# Patient Record
Sex: Female | Born: 1960 | Race: White | Hispanic: No | Marital: Married | Smoking: Never smoker
Health system: Southern US, Community
[De-identification: ages and names within clinical notes are randomized; demographics above are authoritative.]

## PROBLEM LIST (undated history)

## (undated) DIAGNOSIS — R7989 Other specified abnormal findings of blood chemistry: Secondary | ICD-10-CM

## (undated) DIAGNOSIS — C50919 Malignant neoplasm of unspecified site of unspecified female breast: Secondary | ICD-10-CM

## (undated) DIAGNOSIS — E7800 Pure hypercholesterolemia, unspecified: Secondary | ICD-10-CM

## (undated) DIAGNOSIS — M1990 Unspecified osteoarthritis, unspecified site: Secondary | ICD-10-CM

## (undated) DIAGNOSIS — G43909 Migraine, unspecified, not intractable, without status migrainosus: Secondary | ICD-10-CM

## (undated) DIAGNOSIS — R300 Dysuria: Secondary | ICD-10-CM

## (undated) DIAGNOSIS — J309 Allergic rhinitis, unspecified: Secondary | ICD-10-CM

## (undated) DIAGNOSIS — Z6831 Body mass index (BMI) 31.0-31.9, adult: Secondary | ICD-10-CM

## (undated) DIAGNOSIS — Z973 Presence of spectacles and contact lenses: Secondary | ICD-10-CM

## (undated) DIAGNOSIS — Z87442 Personal history of urinary calculi: Secondary | ICD-10-CM

## (undated) DIAGNOSIS — Z923 Personal history of irradiation: Secondary | ICD-10-CM

## (undated) DIAGNOSIS — G4733 Obstructive sleep apnea (adult) (pediatric): Secondary | ICD-10-CM

## (undated) DIAGNOSIS — F419 Anxiety disorder, unspecified: Secondary | ICD-10-CM

## (undated) DIAGNOSIS — F329 Major depressive disorder, single episode, unspecified: Secondary | ICD-10-CM

## (undated) DIAGNOSIS — E785 Hyperlipidemia, unspecified: Secondary | ICD-10-CM

## (undated) DIAGNOSIS — F32A Depression, unspecified: Secondary | ICD-10-CM

## (undated) DIAGNOSIS — R351 Nocturia: Secondary | ICD-10-CM

## (undated) DIAGNOSIS — E039 Hypothyroidism, unspecified: Secondary | ICD-10-CM

## (undated) HISTORY — DX: Pure hypercholesterolemia, unspecified: E78.00

## (undated) HISTORY — DX: Body mass index (BMI) 31.0-31.9, adult: Z68.31

## (undated) HISTORY — DX: Malignant neoplasm of unspecified site of unspecified female breast: C50.919

## (undated) HISTORY — PX: OTHER SURGICAL HISTORY: SHX169

## (undated) HISTORY — DX: Anxiety disorder, unspecified: F41.9

---

## 1987-05-22 HISTORY — PX: CHOLECYSTECTOMY: SHX55

## 1987-05-22 HISTORY — PX: CHOLECYSTECTOMY OPEN: SUR202

## 2001-09-21 ENCOUNTER — Other Ambulatory Visit: Admission: RE | Admit: 2001-09-21 | Discharge: 2001-09-21 | Payer: Self-pay | Admitting: Obstetrics and Gynecology

## 2013-01-11 ENCOUNTER — Other Ambulatory Visit: Payer: Self-pay | Admitting: Obstetrics and Gynecology

## 2013-01-11 ENCOUNTER — Other Ambulatory Visit (HOSPITAL_COMMUNITY)
Admission: RE | Admit: 2013-01-11 | Discharge: 2013-01-11 | Disposition: A | Discharge status: Home / Self Care | Payer: Self-pay | Source: Ambulatory Visit | Attending: Obstetrics and Gynecology | Admitting: Obstetrics and Gynecology

## 2013-01-11 DIAGNOSIS — Z113 Encounter for screening for infections with a predominantly sexual mode of transmission: Secondary | ICD-10-CM | POA: Insufficient documentation

## 2013-01-11 DIAGNOSIS — Z01419 Encounter for gynecological examination (general) (routine) without abnormal findings: Secondary | ICD-10-CM | POA: Insufficient documentation

## 2013-01-11 DIAGNOSIS — Z1151 Encounter for screening for human papillomavirus (HPV): Secondary | ICD-10-CM | POA: Insufficient documentation

## 2013-01-20 ENCOUNTER — Other Ambulatory Visit: Payer: Self-pay | Admitting: Obstetrics and Gynecology

## 2013-01-25 ENCOUNTER — Encounter (HOSPITAL_COMMUNITY): Payer: Self-pay | Admitting: Pharmacy Technician

## 2013-01-27 ENCOUNTER — Encounter (HOSPITAL_COMMUNITY)
Admission: RE | Admit: 2013-01-27 | Discharge: 2013-01-27 | Disposition: A | Discharge status: Home / Self Care | Payer: No Typology Code available for payment source | Source: Ambulatory Visit | Attending: Obstetrics and Gynecology | Admitting: Obstetrics and Gynecology

## 2013-01-27 ENCOUNTER — Other Ambulatory Visit: Payer: Self-pay | Admitting: Obstetrics and Gynecology

## 2013-01-27 ENCOUNTER — Encounter (HOSPITAL_COMMUNITY): Payer: Self-pay

## 2013-01-27 HISTORY — DX: Hypothyroidism, unspecified: E03.9

## 2013-01-27 HISTORY — DX: Depression, unspecified: F32.A

## 2013-01-27 HISTORY — DX: Major depressive disorder, single episode, unspecified: F32.9

## 2013-01-27 LAB — URINALYSIS, ROUTINE W REFLEX MICROSCOPIC
Glucose, UA: NEGATIVE mg/dL
Nitrite: POSITIVE — AB
Protein, ur: 100 mg/dL — AB
pH: 7 (ref 5.0–8.0)

## 2013-01-27 LAB — BASIC METABOLIC PANEL
BUN: 15 mg/dL (ref 6–23)
Chloride: 102 mEq/L (ref 96–112)
Glucose, Bld: 103 mg/dL — ABNORMAL HIGH (ref 70–99)
Potassium: 3.7 mEq/L (ref 3.5–5.1)

## 2013-01-27 LAB — SURGICAL PCR SCREEN
MRSA, PCR: NEGATIVE
Staphylococcus aureus: NEGATIVE

## 2013-01-27 LAB — CBC
HCT: 37.8 % (ref 36.0–46.0)
Hemoglobin: 12.6 g/dL (ref 12.0–15.0)
MCH: 27.9 pg (ref 26.0–34.0)
MCHC: 33.3 g/dL (ref 30.0–36.0)
RBC: 4.52 MIL/uL (ref 3.87–5.11)

## 2013-01-27 LAB — URINE MICROSCOPIC-ADD ON

## 2013-01-27 NOTE — Patient Instructions (Signed)
Elizabeth Bartlett  01/27/2013   Your procedure is scheduled on:  Tuesday, 01/31/13  Report to [facility] [place] [facility].  Call this number if you have problems the morning of surgery: [phone]   Remember:   Do not eat food or drink liquids after midnight.   Take these medicines the morning of surgery with A SIP OF WATER: prozac, levothyroxine, flonase   Do not wear jewelry, make-up or nail polish.  Do not wear lotions, powders, or perfumes. You may wear deodorant.  Do not shave 48 hours prior to surgery. Men may shave face and neck.  Do not bring valuables to the hospital.  Contacts, dentures or bridgework may not be worn into surgery.  Leave suitcase in the car. After surgery it may be brought to your room.  For patients admitted to the hospital, checkout time is 11:00 AM the day of  discharge.   Patients discharged the day of surgery will not be allowed to drive  home.  Name and phone number of your driver: family  Special Instructions: Shower using CHG 2 nights before surgery and the night before surgery.  If you shower the day of surgery use CHG.  Use special wash - you have one bottle of CHG for all showers.  You should use approximately 1/3 of the bottle for each shower.   Please read over the following fact sheets that you were given: Pain Booklet, MRSA Information, Surgical Site Infection Prevention, Anesthesia Post-op Instructions and Care and Recovery After Surgery   Supracervical Hysterectomy A supracervical hysterectomy is minimally invasive surgery to remove the top part of the uterus, but not the cervix. This surgery can be performed by making a large cut (incision) in the abdomen. It can also be done with a thin, lighted tube (laparoscope) inserted into 2 small incisions in the lower abdomen. Your fallopian tubes and ovaries can be removed (bilateral salpingo-oopherectomy) during this surgery as well. If a supracervical hysterectomy is started and it is not safe to continue, the  laparoscopic surgery will be converted to an open abdominal surgery. You will not have menstrual periods or be able to get pregnant after having this surgery. If a bilateral salpingo-oopherectomy was performed before menopause, you will go through a sudden (abrupt) menopause. This can be helped with hormone medicines. Benefits of minimally invasive surgery include:  Less pain.  Less risk of blood loss.  Less risk of infection.  Quicker return to normal activities.  Usually a 1 night stay in the hospital.  Overall patient satisfaction. LET YOUR CAREGIVER KNOW ABOUT:  Any history of abnormal Pap tests.  Allergies to food or medicine.  Medicines taken, including vitamins, herbs, eyedrops, over-the-counter medicines, and creams.  Use of steroids (by mouth or creams).  Previous problems with anesthetics or numbing medicines.  History of bleeding problems or blood clots.  Previous surgery.  Other health problems, including diabetes and kidney problems.  Any infections or colds you may have developed.  Symptoms of irregular or heavy periods, weight loss, or urinary or bowel changes. RISKS AND COMPLICATIONS   Bleeding.  Blood clots in the legs or lung.  Infection.  Injury to surrounding organs.  Problems with anesthesia.  Risk of conversion to an open abdominal incision.  Early menopause symptoms (hot flashes, night sweats, insomnia).  Additional surgery later to remove the cervix if you have problems with the cervix. BEFORE THE PROCEDURE  Ask your caregiver about changing or stopping your regular medicines.  Do not take  aspirin or blood thinners (anticoagulants) for 1 week before the surgery, or as told by your caregiver.  Do not eat or drink anything for 8 hours before the surgery, or as told by your caregiver.  Quit smoking if you smoke.  Arrange for a ride home after surgery and for someone to help you at home during recovery. PROCEDURE   You will be  given an antibiotic medicine.  An intravenous (IV) line will be placed in your arm. You will be given medicine to make you sleep (general anesthetic).  A gas (carbon dioxide) will be used to inflate your abdomen. This will allow your surgeon to look inside your abdomen, perform your surgery, and treat any other problems found if necessary.  Three or four small incisions (often less than  inch) will be made in your abdomen. One of these incisions will be made in the area of your belly button (navel). The laparoscope will be inserted into the incision. Your surgeon will look through the laparoscope while doing your procedure.  Other surgical instruments will be inserted through the other incisions.  The uterus will be cut into small pieces and removed through the small incisions.  Your incisions will be closed. AFTER THE PROCEDURE   The gas will be released from inside your abdomen.  You will be taken to the recovery area where a nurse will watch and check your progress. Once you are awake, stable, and taking fluids well, without other problems, you will return to your room or be allowed to go home.  There is usually minimal discomfort following the surgery because the incisions are so small.  You will be given pain medicine while you are in the hospital and for when you go home.  Try to have someone with you for the first 3 to 5 days after you go home.  Follow up with your surgeon in 2 to 4 weeks after surgery to evaluate your progress. Document Released: 05/25/2008 Document Revised: 02/29/2012 Document Reviewed: 07/24/2011 [facility] Patient Information 2013 [place], [place].

## 2013-01-28 LAB — TYPE AND SCREEN: ABO/RH(D): A POS

## 2013-01-29 LAB — URINE CULTURE
Colony Count: NO GROWTH
Culture: NO GROWTH

## 2013-01-30 NOTE — H&P (Signed)
Elizabeth Bartlett is an 52 y.o. female. She is admitted for laparoscopic supracervical hysterectomy for resolution of heavy periods after evaluation in December January this months. Menses are described as heavy for 2 and 3 weeks, descensus in severity without resulting anemia ends were normal until her 78s  Pertinent Gynecological History: Menses: usually lasting 6-10 days to . days Bleeding:  Contraception: coitus interruptus DES exposure: unknown Blood transfusions: none Sexually transmitted diseases: no past history Previous GYN Procedures: cryo  Last mammogram: normal Date:  Last pap:  Date:  OB History: G, P    Menstrual History: Menarche age:  No LMP recorded.    Past Medical History  Diagnosis Date  . Hypothyroidism   . Depression     Past Surgical History  Procedure Laterality Date  . Cesarean section  02/1987  . Cholecystectomy  05/1987    open    No family history on file.  Social History:  reports that she has never smoked. She does not have any smokeless tobacco history on file. She reports that she does not drink alcohol or use illicit drugs.  Allergies: No Known Allergies  No prescriptions prior to admission   Weight 152 pounds pulse 70s urinalysis negative LMP now Review of Systems  Constitutional: Negative.  Negative for fever and chills.  HENT: Negative.   Respiratory: Negative.  Negative for cough, sputum production and shortness of breath.   Cardiovascular: Negative.   Genitourinary:       Notable for GYN history for cycles lasting 7-10 days per month in addition a heaviness and clots. She had normal periods until the late 40s    There were no vitals taken for this visit. Physical Exam  Vitals reviewed. Constitutional: She is oriented to person, place, and time. She appears well-developed and well-nourished.  HENT:  Head: Normocephalic and atraumatic.  Eyes: Pupils are equal, round, and reactive to light.  Neck: Normal range of motion.   Cardiovascular: Normal rate and regular rhythm.   Respiratory: Effort normal and breath sounds normal.  GI: Soft. Bowel sounds are normal.  Genitourinary: Vagina normal and uterus normal.  Today's ultrasound during preop shows a thickened endometrium the 9 mm echogenic focus consistent with polyp or myoma. The adnexa are normal uterus is essentially normal except for small 13 mm fibroid and in the anterior uterine wall. She has a prior cesarean section and visible old C-section scar per ultrasound  Musculoskeletal: Normal range of motion.  Neurological: She is alert and oriented to person, place, and time.    No results found for this or any previous visit (from the past 24 hour(s)).  No results found.  Assessment/Plan: None Glean Hess with benign endometrium on tissue sample, declining consideration of endometrial ablation Treatment and alternatives reviewed with patient with laparoscopic supracervical hysterectomy selected patient declines consideration of endometrial ablation and lesser procedures  Thadd Apuzzo V 01/30/2013, 8:47 PM

## 2013-01-31 ENCOUNTER — Ambulatory Visit (HOSPITAL_COMMUNITY): Payer: No Typology Code available for payment source | Admitting: Anesthesiology

## 2013-01-31 ENCOUNTER — Encounter (HOSPITAL_COMMUNITY)
Admission: RE | Disposition: A | Discharge status: Home / Self Care | Payer: Self-pay | Source: Ambulatory Visit | Attending: Obstetrics and Gynecology

## 2013-01-31 ENCOUNTER — Encounter (HOSPITAL_COMMUNITY): Payer: Self-pay | Admitting: *Deleted

## 2013-01-31 ENCOUNTER — Observation Stay (HOSPITAL_COMMUNITY)
Admission: RE | Admit: 2013-01-31 | Discharge: 2013-02-01 | Disposition: A | Discharge status: Home / Self Care | Payer: No Typology Code available for payment source | Source: Ambulatory Visit | Attending: Obstetrics and Gynecology | Admitting: Obstetrics and Gynecology

## 2013-01-31 ENCOUNTER — Encounter (HOSPITAL_COMMUNITY): Payer: Self-pay | Admitting: Anesthesiology

## 2013-01-31 DIAGNOSIS — E039 Hypothyroidism, unspecified: Secondary | ICD-10-CM | POA: Insufficient documentation

## 2013-01-31 DIAGNOSIS — N920 Excessive and frequent menstruation with regular cycle: Principal | ICD-10-CM | POA: Insufficient documentation

## 2013-01-31 DIAGNOSIS — N924 Excessive bleeding in the premenopausal period: Secondary | ICD-10-CM

## 2013-01-31 DIAGNOSIS — D259 Leiomyoma of uterus, unspecified: Secondary | ICD-10-CM | POA: Insufficient documentation

## 2013-01-31 DIAGNOSIS — Z01812 Encounter for preprocedural laboratory examination: Secondary | ICD-10-CM | POA: Insufficient documentation

## 2013-01-31 HISTORY — PX: LAPAROSCOPIC SUPRACERVICAL HYSTERECTOMY: SHX5399

## 2013-01-31 HISTORY — PX: BILATERAL SALPINGECTOMY: SHX5743

## 2013-01-31 SURGERY — HYSTERECTOMY, SUPRACERVICAL, LAPAROSCOPIC
Anesthesia: General | Site: Abdomen | Wound class: Clean

## 2013-01-31 MED ORDER — NEOSTIGMINE METHYLSULFATE 1 MG/ML IJ SOLN
INTRAMUSCULAR | Status: AC
  Filled 2013-01-31: qty 1

## 2013-01-31 MED ORDER — KCL IN DEXTROSE-NACL 20-5-0.45 MEQ/L-%-% IV SOLN
INTRAVENOUS | Status: DC
  Administered 2013-01-31: via INTRAVENOUS

## 2013-01-31 MED ORDER — LACTATED RINGERS IV SOLN: INTRAVENOUS | Status: DC

## 2013-01-31 MED ORDER — SODIUM CHLORIDE 0.9 % IR SOLN
Status: DC | PRN
  Administered 2013-01-31: 3000 mL

## 2013-01-31 MED ORDER — LIDOCAINE HCL (PF) 1 % IJ SOLN
INTRAMUSCULAR | Status: AC
  Filled 2013-01-31: qty 5

## 2013-01-31 MED ORDER — MIDAZOLAM HCL 5 MG/5ML IJ SOLN
INTRAMUSCULAR | Status: DC | PRN
  Administered 2013-01-31: 2 mg via INTRAVENOUS

## 2013-01-31 MED ORDER — FENTANYL CITRATE 0.05 MG/ML IJ SOLN
INTRAMUSCULAR | Status: AC
  Filled 2013-01-31: qty 2

## 2013-01-31 MED ORDER — PROPOFOL 10 MG/ML IV EMUL
INTRAVENOUS | Status: DC | PRN
  Administered 2013-01-31: 120 mg via INTRAVENOUS

## 2013-01-31 MED ORDER — SUFENTANIL CITRATE 50 MCG/ML IV SOLN
INTRAVENOUS | Status: DC | PRN
  Administered 2013-01-31 (×2): 10 ug via INTRAVENOUS
  Administered 2013-01-31: 30 ug via INTRAVENOUS

## 2013-01-31 MED ORDER — LIDOCAINE HCL (CARDIAC) 10 MG/ML IV SOLN
INTRAVENOUS | Status: DC | PRN
  Administered 2013-01-31: 50 mg via INTRAVENOUS

## 2013-01-31 MED ORDER — ONDANSETRON HCL 4 MG/2ML IJ SOLN
INTRAMUSCULAR | Status: AC
  Filled 2013-01-31: qty 2

## 2013-01-31 MED ORDER — ROCURONIUM BROMIDE 100 MG/10ML IV SOLN
INTRAVENOUS | Status: DC | PRN
  Administered 2013-01-31: 10 mg via INTRAVENOUS
  Administered 2013-01-31: 40 mg via INTRAVENOUS

## 2013-01-31 MED ORDER — ONDANSETRON HCL 4 MG/2ML IJ SOLN
4.0000 mg | Freq: Once | INTRAMUSCULAR | Status: AC | PRN
  Administered 2013-01-31: 4 mg via INTRAVENOUS

## 2013-01-31 MED ORDER — GLYCOPYRROLATE 0.2 MG/ML IJ SOLN
INTRAMUSCULAR | Status: DC | PRN
  Administered 2013-01-31: 0.6 mg via INTRAVENOUS

## 2013-01-31 MED ORDER — CEFAZOLIN SODIUM-DEXTROSE 2-3 GM-% IV SOLR
INTRAVENOUS | Status: AC
  Filled 2013-01-31: qty 50

## 2013-01-31 MED ORDER — KETOROLAC TROMETHAMINE 30 MG/ML IJ SOLN
INTRAMUSCULAR | Status: AC
  Filled 2013-01-31: qty 1

## 2013-01-31 MED ORDER — KETOROLAC TROMETHAMINE 30 MG/ML IJ SOLN: 30.0000 mg | Freq: Three times a day (TID) | INTRAMUSCULAR | Status: AC

## 2013-01-31 MED ORDER — LACTATED RINGERS IV SOLN
INTRAVENOUS | Status: DC | PRN
  Administered 2013-01-31 (×3): via INTRAVENOUS

## 2013-01-31 MED ORDER — BUPIVACAINE HCL (PF) 0.5 % IJ SOLN
INTRAMUSCULAR | Status: DC | PRN
  Administered 2013-01-31: 10 mL

## 2013-01-31 MED ORDER — FLUOXETINE HCL 20 MG PO CAPS
40.0000 mg | ORAL_CAPSULE | Freq: Every day | ORAL | Status: DC
  Administered 2013-02-01: 40 mg via ORAL
  Filled 2013-01-31: qty 2

## 2013-01-31 MED ORDER — GLYCOPYRROLATE 0.2 MG/ML IJ SOLN
0.2000 mg | Freq: Once | INTRAMUSCULAR | Status: AC
  Administered 2013-01-31: 0.2 mg via INTRAVENOUS

## 2013-01-31 MED ORDER — LEVOTHYROXINE SODIUM 25 MCG PO TABS
25.0000 ug | ORAL_TABLET | Freq: Every day | ORAL | Status: DC
  Administered 2013-02-01: 25 ug via ORAL
  Filled 2013-01-31: qty 1

## 2013-01-31 MED ORDER — ONDANSETRON HCL 4 MG PO TABS: 4.0000 mg | ORAL_TABLET | Freq: Four times a day (QID) | ORAL | Status: DC | PRN

## 2013-01-31 MED ORDER — FENTANYL CITRATE 0.05 MG/ML IJ SOLN
25.0000 ug | INTRAMUSCULAR | Status: DC | PRN
  Administered 2013-01-31 (×2): 50 ug via INTRAVENOUS

## 2013-01-31 MED ORDER — HYDROMORPHONE HCL PF 1 MG/ML IJ SOLN: 0.2000 mg | INTRAMUSCULAR | Status: DC | PRN

## 2013-01-31 MED ORDER — MIDAZOLAM HCL 2 MG/2ML IJ SOLN
INTRAMUSCULAR | Status: AC
  Filled 2013-01-31: qty 2

## 2013-01-31 MED ORDER — ROCURONIUM BROMIDE 50 MG/5ML IV SOLN
INTRAVENOUS | Status: AC
  Filled 2013-01-31: qty 1

## 2013-01-31 MED ORDER — GLYCOPYRROLATE 0.2 MG/ML IJ SOLN
INTRAMUSCULAR | Status: AC
  Filled 2013-01-31: qty 1

## 2013-01-31 MED ORDER — GLYCOPYRROLATE 0.2 MG/ML IJ SOLN
INTRAMUSCULAR | Status: AC
  Filled 2013-01-31: qty 3

## 2013-01-31 MED ORDER — NEOSTIGMINE METHYLSULFATE 1 MG/ML IJ SOLN
INTRAMUSCULAR | Status: DC | PRN
  Administered 2013-01-31: 4 mg via INTRAVENOUS

## 2013-01-31 MED ORDER — ONDANSETRON HCL 4 MG/2ML IJ SOLN
4.0000 mg | Freq: Once | INTRAMUSCULAR | Status: AC
  Administered 2013-01-31: 4 mg via INTRAVENOUS

## 2013-01-31 MED ORDER — KETOROLAC TROMETHAMINE 30 MG/ML IJ SOLN
30.0000 mg | Freq: Three times a day (TID) | INTRAMUSCULAR | Status: AC
  Administered 2013-01-31 – 2013-02-01 (×3): 30 mg via INTRAVENOUS
  Filled 2013-01-31 (×3): qty 1

## 2013-01-31 MED ORDER — PANTOPRAZOLE SODIUM 40 MG PO TBEC
40.0000 mg | DELAYED_RELEASE_TABLET | Freq: Every day | ORAL | Status: DC
  Administered 2013-02-01 (×2): 40 mg via ORAL
  Filled 2013-01-31: qty 1

## 2013-01-31 MED ORDER — BUPIVACAINE HCL (PF) 0.5 % IJ SOLN
INTRAMUSCULAR | Status: AC
  Filled 2013-01-31: qty 30

## 2013-01-31 MED ORDER — CEFAZOLIN SODIUM-DEXTROSE 2-3 GM-% IV SOLR: 2.0000 g | INTRAVENOUS | Status: DC

## 2013-01-31 MED ORDER — ONDANSETRON HCL 4 MG/2ML IJ SOLN
4.0000 mg | Freq: Four times a day (QID) | INTRAMUSCULAR | Status: DC | PRN
  Filled 2013-01-31: qty 2

## 2013-01-31 MED ORDER — PROPOFOL 10 MG/ML IV EMUL
INTRAVENOUS | Status: AC
  Filled 2013-01-31: qty 20

## 2013-01-31 MED ORDER — CEFAZOLIN SODIUM-DEXTROSE 2-3 GM-% IV SOLR
INTRAVENOUS | Status: DC | PRN
  Administered 2013-01-31: 2 g via INTRAVENOUS

## 2013-01-31 MED ORDER — SUCCINYLCHOLINE CHLORIDE 20 MG/ML IJ SOLN
INTRAMUSCULAR | Status: AC
  Filled 2013-01-31: qty 1

## 2013-01-31 MED ORDER — MIDAZOLAM HCL 2 MG/2ML IJ SOLN
1.0000 mg | INTRAMUSCULAR | Status: DC | PRN
  Administered 2013-01-31: 2 mg via INTRAVENOUS

## 2013-01-31 MED ORDER — ARTIFICIAL TEARS OP OINT
TOPICAL_OINTMENT | OPHTHALMIC | Status: AC
  Filled 2013-01-31: qty 3.5

## 2013-01-31 MED ORDER — SUFENTANIL CITRATE 50 MCG/ML IV SOLN
INTRAVENOUS | Status: AC
  Filled 2013-01-31: qty 1

## 2013-01-31 MED ORDER — OXYCODONE-ACETAMINOPHEN 5-325 MG PO TABS
1.0000 | ORAL_TABLET | ORAL | Status: DC | PRN
  Administered 2013-01-31: 1 via ORAL
  Filled 2013-01-31: qty 1

## 2013-01-31 MED ORDER — KETOROLAC TROMETHAMINE 30 MG/ML IJ SOLN
30.0000 mg | Freq: Once | INTRAMUSCULAR | Status: AC
  Administered 2013-01-31: 30 mg via INTRAVENOUS

## 2013-01-31 MED ORDER — 0.9 % SODIUM CHLORIDE (POUR BTL) OPTIME
TOPICAL | Status: DC | PRN
  Administered 2013-01-31: 1000 mL

## 2013-01-31 SURGICAL SUPPLY — 56 items
ADH SKN CLS APL DERMABOND .7 (GAUZE/BANDAGES/DRESSINGS)
APPLIER CLIP UNV 5X34 EPIX (ENDOMECHANICALS) ×3 IMPLANT
APR XCLPCLP 20M/L UNV 34X5 (ENDOMECHANICALS) ×2
BAG HAMPER (MISCELLANEOUS) ×3 IMPLANT
BLADE LAPAROSCOPIC MORCELL KIT (BLADE) ×3 IMPLANT
BLADE SURG SZ11 CARB STEEL (BLADE) ×3 IMPLANT
CLOTH BEACON ORANGE TIMEOUT ST (SAFETY) ×3 IMPLANT
COVER LIGHT HANDLE STERIS (MISCELLANEOUS) ×6 IMPLANT
DERMABOND ADVANCED (GAUZE/BANDAGES/DRESSINGS)
DERMABOND ADVANCED .7 DNX12 (GAUZE/BANDAGES/DRESSINGS) IMPLANT
DISSECTOR BLUNT TIP ENDO 5MM (MISCELLANEOUS) IMPLANT
DRAPE PROXIMA HALF (DRAPES) ×3 IMPLANT
DRESSING COVERLET 3X1 FLEXIBLE (GAUZE/BANDAGES/DRESSINGS) ×12 IMPLANT
ELECT REM PT RETURN 9FT ADLT (ELECTROSURGICAL) ×3
ELECTRODE REM PT RTRN 9FT ADLT (ELECTROSURGICAL) ×2 IMPLANT
FILTER SMOKE EVAC LAPAROSHD (FILTER) ×3 IMPLANT
FORMALIN 10 PREFIL 480ML (MISCELLANEOUS) ×3 IMPLANT
GAUZE SPONGE 4X4 16PLY XRAY LF (GAUZE/BANDAGES/DRESSINGS) ×3 IMPLANT
GLOVE ECLIPSE 9.0 STRL (GLOVE) ×3 IMPLANT
GLOVE INDICATOR STER SZ 9 (GLOVE) ×3 IMPLANT
GOWN STRL REIN 3XL LVL4 (GOWN DISPOSABLE) ×3 IMPLANT
GOWN STRL REIN XL XLG (GOWN DISPOSABLE) ×6 IMPLANT
INST SET LAPROSCOPIC GYN AP (KITS) ×3 IMPLANT
IV NS IRRIG 3000ML ARTHROMATIC (IV SOLUTION) ×3 IMPLANT
KIT ROOM TURNOVER AP CYSTO (KITS) ×3 IMPLANT
MANIFOLD NEPTUNE II (INSTRUMENTS) ×3 IMPLANT
NDL HYPO 25X1 1.5 SAFETY (NEEDLE) IMPLANT
NDL INSUFFLATION 14GA 120MM (NEEDLE) ×2 IMPLANT
NEEDLE HYPO 25X1 1.5 SAFETY (NEEDLE) ×3 IMPLANT
NEEDLE INSUFFLATION 14GA 120MM (NEEDLE) ×3 IMPLANT
NS IRRIG 1000ML POUR BTL (IV SOLUTION) ×3 IMPLANT
PACK PERI GYN (CUSTOM PROCEDURE TRAY) ×3 IMPLANT
PAD ARMBOARD 7.5X6 YLW CONV (MISCELLANEOUS) ×3 IMPLANT
SCALPEL HARMONIC ACE (MISCELLANEOUS) ×3 IMPLANT
SET BASIN LINEN APH (SET/KITS/TRAYS/PACK) ×3 IMPLANT
SET TUBE IRRIG SUCTION NO TIP (IRRIGATION / IRRIGATOR) ×4 IMPLANT
SOL PREP PROV IODINE SCRUB 4OZ (MISCELLANEOUS) ×3 IMPLANT
SOLUTION ANTI FOG 6CC (MISCELLANEOUS) ×3 IMPLANT
SPONGE GAUZE 4X4 12PLY (GAUZE/BANDAGES/DRESSINGS) ×1 IMPLANT
STRIP CLOSURE SKIN 1/4X3 (GAUZE/BANDAGES/DRESSINGS) ×3 IMPLANT
SUT CHROMIC 0 CT 1 (SUTURE) ×2 IMPLANT
SUT CHROMIC 2 0 CT 1 (SUTURE) ×2 IMPLANT
SUT VIC AB 2-0 CT2 27 (SUTURE) ×2 IMPLANT
SUT VIC AB 4-0 PS2 27 (SUTURE) ×3 IMPLANT
SUT VICRYL 0 UR6 27IN ABS (SUTURE) ×3 IMPLANT
SYR CONTROL 10ML LL (SYRINGE) ×1 IMPLANT
SYRINGE 10CC LL (SYRINGE) ×3 IMPLANT
TAPE CLOTH SURG 4X10 WHT LF (GAUZE/BANDAGES/DRESSINGS) ×1 IMPLANT
TRAY FOLEY CATH 14FR (SET/KITS/TRAYS/PACK) ×3 IMPLANT
TROCAR Z-THAD FIOS HNDL 12X100 (TROCAR) ×3 IMPLANT
TROCAR Z-THRD FIOS HNDL 11X100 (TROCAR) ×3 IMPLANT
TROCAR Z-THREAD FIOS 5X100MM (TROCAR) IMPLANT
TROCAR Z-THREAD OPTICAL 5X100M (TROCAR) ×3 IMPLANT
TROCAR Z-THREAD SLEEVE 11X100 (TROCAR) ×3 IMPLANT
TUBING HI FLO HEAT INSUFFLATOR (IRRIGATION / IRRIGATOR) ×2 IMPLANT
WARMER LAPAROSCOPE (MISCELLANEOUS) ×3 IMPLANT

## 2013-01-31 NOTE — Anesthesia Preprocedure Evaluation (Signed)
Anesthesia Evaluation  Patient identified by MRN, date of birth, ID band Patient awake    Reviewed: Allergy & Precautions, H&P , NPO status , Patient's Chart, lab work & pertinent test results  History of Anesthesia Complications Negative for: history of anesthetic complications  Airway Mallampati: I TM Distance: >3 FB     Dental  (+) Teeth Intact   Pulmonary neg pulmonary ROS,  breath sounds clear to auscultation        Cardiovascular negative cardio ROS  Rhythm:Regular Rate:Normal     Neuro/Psych PSYCHIATRIC DISORDERS Depression    GI/Hepatic negative GI ROS,   Endo/Other  Hypothyroidism   Renal/GU      Musculoskeletal   Abdominal   Peds  Hematology   Anesthesia Other Findings   Reproductive/Obstetrics                           Anesthesia Physical Anesthesia Plan  ASA: II  Anesthesia Plan: General   Post-op Pain Management:    Induction: Intravenous  Airway Management Planned: Oral ETT  Additional Equipment:   Intra-op Plan:   Post-operative Plan: Extubation in OR  Informed Consent: I have reviewed the patients History and Physical, chart, labs and discussed the procedure including the risks, benefits and alternatives for the proposed anesthesia with the patient or authorized representative who has indicated his/her understanding and acceptance.     Plan Discussed with:   Anesthesia Plan Comments:         Anesthesia Quick Evaluation

## 2013-01-31 NOTE — Progress Notes (Signed)
Cicily notified of 15 minutes call. Voiced understanding.

## 2013-01-31 NOTE — Brief Op Note (Signed)
01/31/2013  10:01 AM  PATIENT:  Elizabeth Bartlett  52 y.o. female  PRE-OPERATIVE DIAGNOSIS:  menometorrhagia uterine fibroids anteversion of uterus POST-OPERATIVE DIAGNOSIS:  menometorrhagia uterine fibroids  PROCEDURE:  Procedure(s): LAPAROSCOPIC SUPRACERVICAL HYSTERECTOMY (N/A) BILATERAL SALPINGECTOMY (Bilateral)  SURGEON:  Surgeon(s) and Role:    * Tilda Burrow, MD - Primary PHYSICIAN ASSISTANT:  ASSISTANTS: [place] RN-FA   ANESTHESIA:   general EBL:  Total I/O In: 2600 [I.V.:2600] Out: 125 [Urine:100; Blood:25]  BLOOD ADMINISTERED:none DRAINS: none   LOCAL MEDICATIONS USED:  MARCAINE    and Amount: 20 ml SPECIMEN:  Source of Specimen:  uterus, bilateral tubes  DISPOSITION OF SPECIMEN:  PATHOLOGY COUNTS:  YES TOURNIQUET:  * No tourniquets in log *  DICTATION: .Dragon Dictation Patient was taken to the operating room prepped and draped for combined abdominal and vaginal procedure with Hulka uterine manipulator attached the cervix and Foley catheter inserted after timeout was confirmed by surgical team, and Ancef 2 g administered intravenously. The patient was in low lithotomy leg support position. An infraumbical incision 1 cm was performed as well as a right lower quadrant suprapubic and left lower quadrant incisions of similar length. Left lower quadrant with smaller incision. Veress needle was used for water droplet technique 2 can firm intraperitoneal location and pneumoperitoneum achieved, then direct insertion of laparoscopic trocar placed, a blade less trocar. Right lower quadrant and left lower quadrant trochars were inserted under direct visualization and inspection the pelvis performed. There was evidence of adhesions from the left Suprapubic right lower  sigmoid epiploic fat to the left pelvic sidewall, to the backside of the left round ligament, and there was bladder flap adhesions from the old cesarean section. Attention was first directed to the right round ligament  which was taken down, and the right tube amputated off of the broad ligament using harmonic scalpel. The you aspects of the right broad ligament were taken down and bladder flap developed anteriorly in the area of the old adhesions which were taken down. No time was the bladder considered involved in the adhesio lysis. Uterine vessels on the right side were skeletonized. On the left side the tip of the tube could not be visualized as it was adherent underneath the sigmoid colon adhesions so the distal two thirds of the fallopian tube were amputated and removed along with transection of the left round ligament. The adhesions on the left side were more dense. There was some small amount of bleeding as we continued to expose and isolate the left uterine vessels. Hemoclips were placed across the proximal uterine vessels, and several were required due to the tendency to ooze. Unipolar cautery was resolved the bleeding. Attention was then returned to the right side where the hemoclips were similarly applied, the uterine vessels transected, and then the cervical stump amputated at the lower uterine segment, just above the posterior insertion of the uterosacral ligaments, with good hemostasis. Specimen was amputated off the cuff, then pedicle inspected for hemostasis. The only significant bleeding had been from the area in front of the left uterine vessels. Estimated blood loss 25 cc at this time. The uterine morcellator was placed through the right lower quadrant trocar insertion site, and morcellation of the uterus performed without difficulty. All tissue fragments as visible sized tissue samples were removed Allis was irrigated briefly. Pollock cautery to the endocervical canal was then performed pelvis was irrigated with saline left in place. Instruments were removed pelvis irrigated, deflated and the fascial closure the right lower quadrant  suprapubic and umbilical sites performed with 0 Vicryl being careful to use S.  retractors to elevate the fascia out of the area of the intra-abdominal contents.. Incisions were irrigated dried subcuticularly closed with 4-0 Vicryl. Patient tolerated procedure well had Marcaine injected around the fascial closures and will be allowed to go home from day surgery unless the pain management issues  needs overnight observation. Sponge and needle counts correct  PLAN OF CARE: Discharge to home after PACU  PATIENT DISPOSITION:  PACU - hemodynamically stable.   Delay start of Pharmacological VTE agent (>24hrs) due to surgical blood loss or risk of bleeding: not applicable

## 2013-01-31 NOTE — Interval H&P Note (Signed)
History and Physical Interval Note:  01/31/2013 7:25 AM  Elizabeth Bartlett  has presented today for surgery, with the diagnosis of menometorrhagia uterine fibroids anteversion of uterus  The various methods of treatment have been discussed with the patient and family. After consideration of risks, benefits and other options for treatment, the patient has consented to  Procedure(s): LAPAROSCOPIC SUPRACERVICAL HYSTERECTOMY (N/A) as a surgical intervention .  The patient's history has been reviewed, patient examined, no change in status, stable for surgery.  I have reviewed the patient's chart and labs.  Questions were answered to the patient's satisfaction.     Tilda Burrow

## 2013-01-31 NOTE — Anesthesia Postprocedure Evaluation (Signed)
  Anesthesia Post-op Note  Patient: Elizabeth Bartlett  Procedure(s) Performed: Procedure(s): LAPAROSCOPIC SUPRACERVICAL HYSTERECTOMY (N/A) BILATERAL SALPINGECTOMY (Bilateral)  Patient Location: PACU  Anesthesia Type: General  Level of Consciousness: awake, alert , oriented and patient cooperative  Airway and Oxygen Therapy: Patient Spontanous Breathing and Patient connected to face mask oxygen  Post-op Pain: mild  Post-op Assessment: Post-op Vital signs reviewed, Patient's Cardiovascular Status Stable, Respiratory Function Stable, Patent Airway and No signs of Nausea or vomiting  Post-op Vital Signs: Reviewed and stable  Complications: No apparent anesthesia complications

## 2013-01-31 NOTE — Anesthesia Procedure Notes (Signed)
Procedure Name: Intubation Date/Time: 01/31/2013 7:41 AM Performed by: Carolyne Littles, Maor Meckel L Pre-anesthesia Checklist: Patient identified, Patient being monitored, Timeout performed, Emergency Drugs available and Suction available Patient Re-evaluated:Patient Re-evaluated prior to inductionOxygen Delivery Method: Circle System Utilized Preoxygenation: Pre-oxygenation with 100% oxygen Intubation Type: IV induction Ventilation: Mask ventilation without difficulty Laryngoscope Size: 3 and Miller Grade View: Grade I Tube type: Oral Tube size: 7.0 mm Number of attempts: 1 Airway Equipment and Method: stylet Placement Confirmation: ETT inserted through vocal cords under direct vision,  positive ETCO2 and breath sounds checked- equal and bilateral Secured at: 21 cm Tube secured with: Tape Dental Injury: Teeth and Oropharynx as per pre-operative assessment

## 2013-01-31 NOTE — Progress Notes (Signed)
UR Chart Review Completed  

## 2013-01-31 NOTE — Transfer of Care (Signed)
  Anesthesia Post-op Note  Patient: Elizabeth Bartlett  Procedure(s) Performed: Procedure(s): LAPAROSCOPIC SUPRACERVICAL HYSTERECTOMY (N/A) BILATERAL SALPINGECTOMY (Bilateral)  Patient Location: PACU  Anesthesia Type: General  Level of Consciousness: awake, alert , oriented and patient cooperative  Airway and Oxygen Therapy: Patient Spontanous Breathing and Patient connected to face mask oxygen  Post-op Pain: mild  Post-op Assessment: Post-op Vital signs reviewed, Patient's Cardiovascular Status Stable, Respiratory Function Stable, Patent Airway and No signs of Nausea or vomiting  Post-op Vital Signs: Reviewed and stable  Complications: No apparent anesthesia complications 

## 2013-01-31 NOTE — Preoperative (Signed)
Beta Blockers   Reason not to administer Beta Blockers:Not Applicable 

## 2013-02-01 LAB — CBC
MCH: 27.5 pg (ref 26.0–34.0)
MCHC: 32.3 g/dL (ref 30.0–36.0)
MCV: 85.3 fL (ref 78.0–100.0)
Platelets: 308 10*3/uL (ref 150–400)

## 2013-02-01 MED ORDER — KETOROLAC TROMETHAMINE 10 MG PO TABS: 10.0000 mg | ORAL_TABLET | Freq: Four times a day (QID) | ORAL | Status: DC | PRN

## 2013-02-01 MED ORDER — HYDROCODONE-ACETAMINOPHEN 5-500 MG PO TABS: 1.0000 | ORAL_TABLET | ORAL | Status: DC | PRN

## 2013-02-01 MED ORDER — CEFAZOLIN SODIUM 1-5 GM-% IV SOLN
INTRAVENOUS | Status: AC
  Filled 2013-02-01: qty 50

## 2013-02-01 NOTE — Plan of Care (Signed)
Problem: Discharge Progression Outcomes Goal: Discontinue staples (if applicable) Outcome: Adequate for Discharge Follow up appt w/ md in 2 weeks

## 2013-02-01 NOTE — Progress Notes (Signed)
UR Chart Review Completed  

## 2013-02-01 NOTE — Discharge Summary (Signed)
Physician Discharge Summary  Patient ID: Elizabeth Bartlett MRN: [id number] DOB/AGE: Jul 01, 1961 52 y.o.  Admit date: 01/31/2013 Discharge date: 02/01/2013  Admission Diagnoses: Menometrorrhagia premenopausal  Discharge Diagnoses: Same Active Problems:   Climacteric menorrhagia  overnight observation postop  Discharged Condition: good  Hospital Course: *This 52 year old female was admitted for laparoscopic supracervical hysterectomy after evaluation in the office including a benign endometrial biopsy normal Pap smear and preoperative evaluation. She underwent laparoscopic supra-cervical hysterectomy on 02/01/2012 was kept overnight observation due to postop sedation and pain management. She was observed overnight, with uneventful postoperative course, tolerating regular diet, with pain management control primarily by Toradol intravenous. She did not tolerate oral Percocet. Diet was tolerated. She'll be followed up in 2 weeks in the office with routine postsurgical instructions reviewed . She had slight erythema around the umbilical incision which may represent type reaction however we will give one postoperative dose of Ancef prior to discharge, within the 24 hour prophylaxis window, and then followup for any continued erythema fever or discharge  Consults: None  Significant Diagnostic Studies:   Treatments: surgery: Scope supracervical hysterectomy  Discharge Exam: Blood pressure 109/75, pulse 75, temperature 98.3 F (36.8 C), temperature source Oral, resp. rate 20, height 5' (1.524 m), weight 84.142 kg (185 lb 8 oz), last menstrual period 01/27/2013, SpO2 95.00%. Physical Examination: General appearance - alert, well appearing, and in no distress, oriented to person, place, and time and no distress Chest - no tachypnea, retractions or cyanosis Abdomen - soft, nontender, nondistended, no masses or organomegaly Incisions clean well approximated without ecchymosis. There is slight erythema around  the umbilical site thought to be tape reaction, but will administer one postoperative dose of antibiotics prior to discharge him a within the 24 prophylaxis window of time. Extremities - Homan's sign negative bilaterally   Disposition: Final discharge disposition not confirmed     Medication List    ASK your doctor about these medications       FLUoxetine 40 MG capsule  Commonly known as:  PROZAC  Take 40 mg by mouth daily.     fluticasone 50 MCG/ACT nasal spray  Commonly known as:  FLONASE  Place 2 sprays into the nose daily as needed. Sinus Pressure     levothyroxine 25 MCG tablet  Commonly known as:  SYNTHROID, LEVOTHROID  Take 25 mcg by mouth daily.        Toradol 10 mg by mouth every 6 hours x5 days when necessary Hydrocodone 5/325 #30 one by mouth every 6 hours when necessary pain  Signed: Tilda Burrow 02/01/2013, 6:29 AM

## 2013-02-02 ENCOUNTER — Encounter (HOSPITAL_COMMUNITY): Payer: Self-pay | Admitting: Obstetrics and Gynecology

## 2013-02-27 NOTE — Op Note (Signed)
DICTATION: Elizabeth Bartlett Dictation  Patient was taken to the operating room prepped and draped for combined abdominal and vaginal procedure with Hulka uterine manipulator attached the cervix and Foley catheter inserted after timeout was confirmed by surgical team, and Ancef 2 g administered intravenously. The patient was in low lithotomy leg support position. An infraumbical incision 1 cm was performed as well as a right lower quadrant suprapubic and left lower quadrant incisions of similar length. Left lower quadrant with smaller incision. Veress needle was used for water droplet technique 2 can firm intraperitoneal location and pneumoperitoneum achieved, then direct insertion of laparoscopic trocar placed, a blade less trocar. Right lower quadrant and left lower quadrant trochars were inserted under direct visualization and inspection the pelvis performed. There was evidence of adhesions from the left Suprapubic right lower sigmoid epiploic fat to the left pelvic sidewall, to the backside of the left round ligament, and there was bladder flap adhesions from the old cesarean section.  Attention was first directed to the right round ligament which was taken down, and the right tube amputated off of the broad ligament using harmonic scalpel. The you aspects of the right broad ligament were taken down and bladder flap developed anteriorly in the area of the old adhesions which were taken down. No time was the bladder considered involved in the adhesio lysis. Uterine vessels on the right side were skeletonized. On the left side the tip of the tube could not be visualized as it was adherent underneath the sigmoid colon adhesions so the distal two thirds of the fallopian tube were amputated and removed along with transection of the left round ligament. The adhesions on the left side were more dense. There was some small amount of bleeding as we continued to expose and isolate the left uterine vessels. Hemoclips were placed  across the proximal uterine vessels, and several were required due to the tendency to ooze. Unipolar cautery was resolved the bleeding. Attention was then returned to the right side where the hemoclips were similarly applied, the uterine vessels transected, and then the cervical stump amputated at the lower uterine segment, just above the posterior insertion of the uterosacral ligaments, with good hemostasis. Specimen was amputated off the cuff, then pedicle inspected for hemostasis. The only significant bleeding had been from the area in front of the left uterine vessels. Estimated blood loss 25 cc at this time. The uterine morcellator was placed through the right lower quadrant trocar insertion site, and morcellation of the uterus performed without difficulty. All tissue fragments as visible sized tissue samples were removed Allis was irrigated briefly. Pollock cautery to the endocervical canal was then performed pelvis was irrigated with saline left in place. Instruments were removed pelvis irrigated, deflated and the fascial closure the right lower quadrant suprapubic and umbilical sites performed with 0 Vicryl being careful to use S. retractors to elevate the fascia out of the area of the intra-abdominal contents.. Incisions were irrigated dried subcuticularly closed with 4-0 Vicryl. Patient tolerated procedure well had Marcaine injected around the fascial closures and will be allowed to go home from day surgery unless the pain management issues needs overnight observation. Sponge and needle counts correct

## 2013-03-10 ENCOUNTER — Ambulatory Visit: Payer: Self-pay | Admitting: Obstetrics and Gynecology

## 2013-03-10 ENCOUNTER — Encounter: Payer: Self-pay | Admitting: Obstetrics and Gynecology

## 2013-03-10 ENCOUNTER — Ambulatory Visit (INDEPENDENT_AMBULATORY_CARE_PROVIDER_SITE_OTHER): Payer: No Typology Code available for payment source | Admitting: Obstetrics and Gynecology

## 2013-03-10 VITALS — BP 128/82 | Wt 165.0 lb

## 2013-03-10 DIAGNOSIS — Z09 Encounter for follow-up examination after completed treatment for conditions other than malignant neoplasm: Secondary | ICD-10-CM

## 2013-03-10 NOTE — Progress Notes (Signed)
Elizabeth Bartlett IS DOING GREAT P [facility]

## 2015-03-22 DIAGNOSIS — C50811 Malignant neoplasm of overlapping sites of right female breast: Secondary | ICD-10-CM

## 2015-03-22 HISTORY — DX: Malignant neoplasm of overlapping sites of right female breast: C50.811

## 2015-04-03 ENCOUNTER — Other Ambulatory Visit: Payer: Self-pay | Admitting: Family Medicine

## 2015-04-03 DIAGNOSIS — R921 Mammographic calcification found on diagnostic imaging of breast: Secondary | ICD-10-CM

## 2015-04-08 ENCOUNTER — Ambulatory Visit
Admission: RE | Admit: 2015-04-08 | Discharge: 2015-04-08 | Disposition: A | Discharge status: Home / Self Care | Payer: BLUE CROSS/BLUE SHIELD | Source: Ambulatory Visit | Attending: Family Medicine | Admitting: Family Medicine

## 2015-04-08 DIAGNOSIS — R921 Mammographic calcification found on diagnostic imaging of breast: Secondary | ICD-10-CM

## 2015-04-08 IMAGING — MG MM BREAST BX W/ LOC DEV 1ST LESION IMAGE BX SPEC STEREO GUIDE*R*
2 series · 2 of 2 positions shown · non-contrast
Comparison: Previous exams.

ADDENDUM:
Pathology revealed a Right Breast low grade Ductal Carcinoma In Situ
with calcifications. This was found to be concordant by Dr. MITHAT DZEKA
MITHAT DZEKA. Pathology was discussed with the patient via telephone. The
patient reported no problems with the biopsy site. Post biopsy
instructions were reviewed and questions were answered. The patient
was encouraged to call The [REDACTED] with
any additional questions and/or concerns. The patient will discuss
surgical referral with Dr. MITHAT DZEKA. In addition to management of
the known sites of carcinoma within each breast, the patient will
also need excision of the suspicious calcifications within the
lower, slightly outer left breast, anterior depth, which are not
amenable to stereotactic biopsy.

Pathology results reported by MITHAT DZEKA RN on [DATE].
CLINICAL DATA: 53-year-old female for biopsy of indeterminate right
breast calcifications
EXAM:
RIGHT BREAST STEREOTACTIC CORE NEEDLE BIOPSY

[R ML]
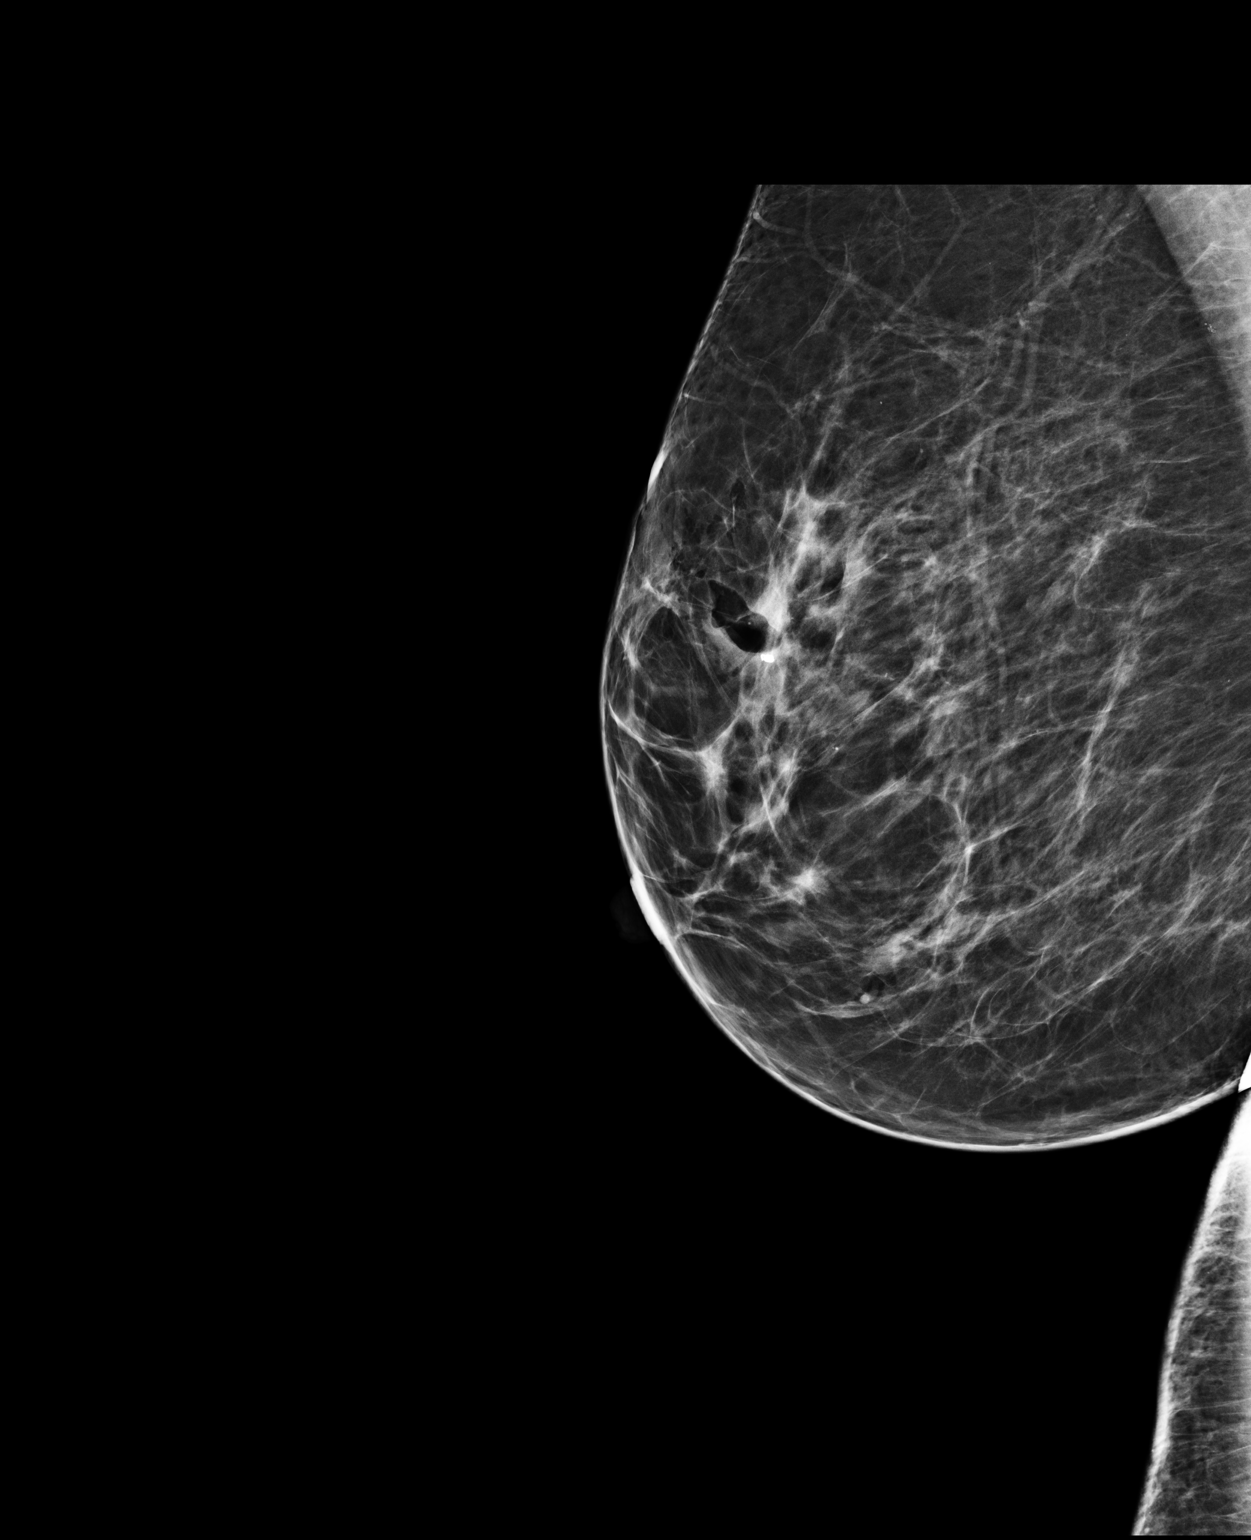

[R CC]
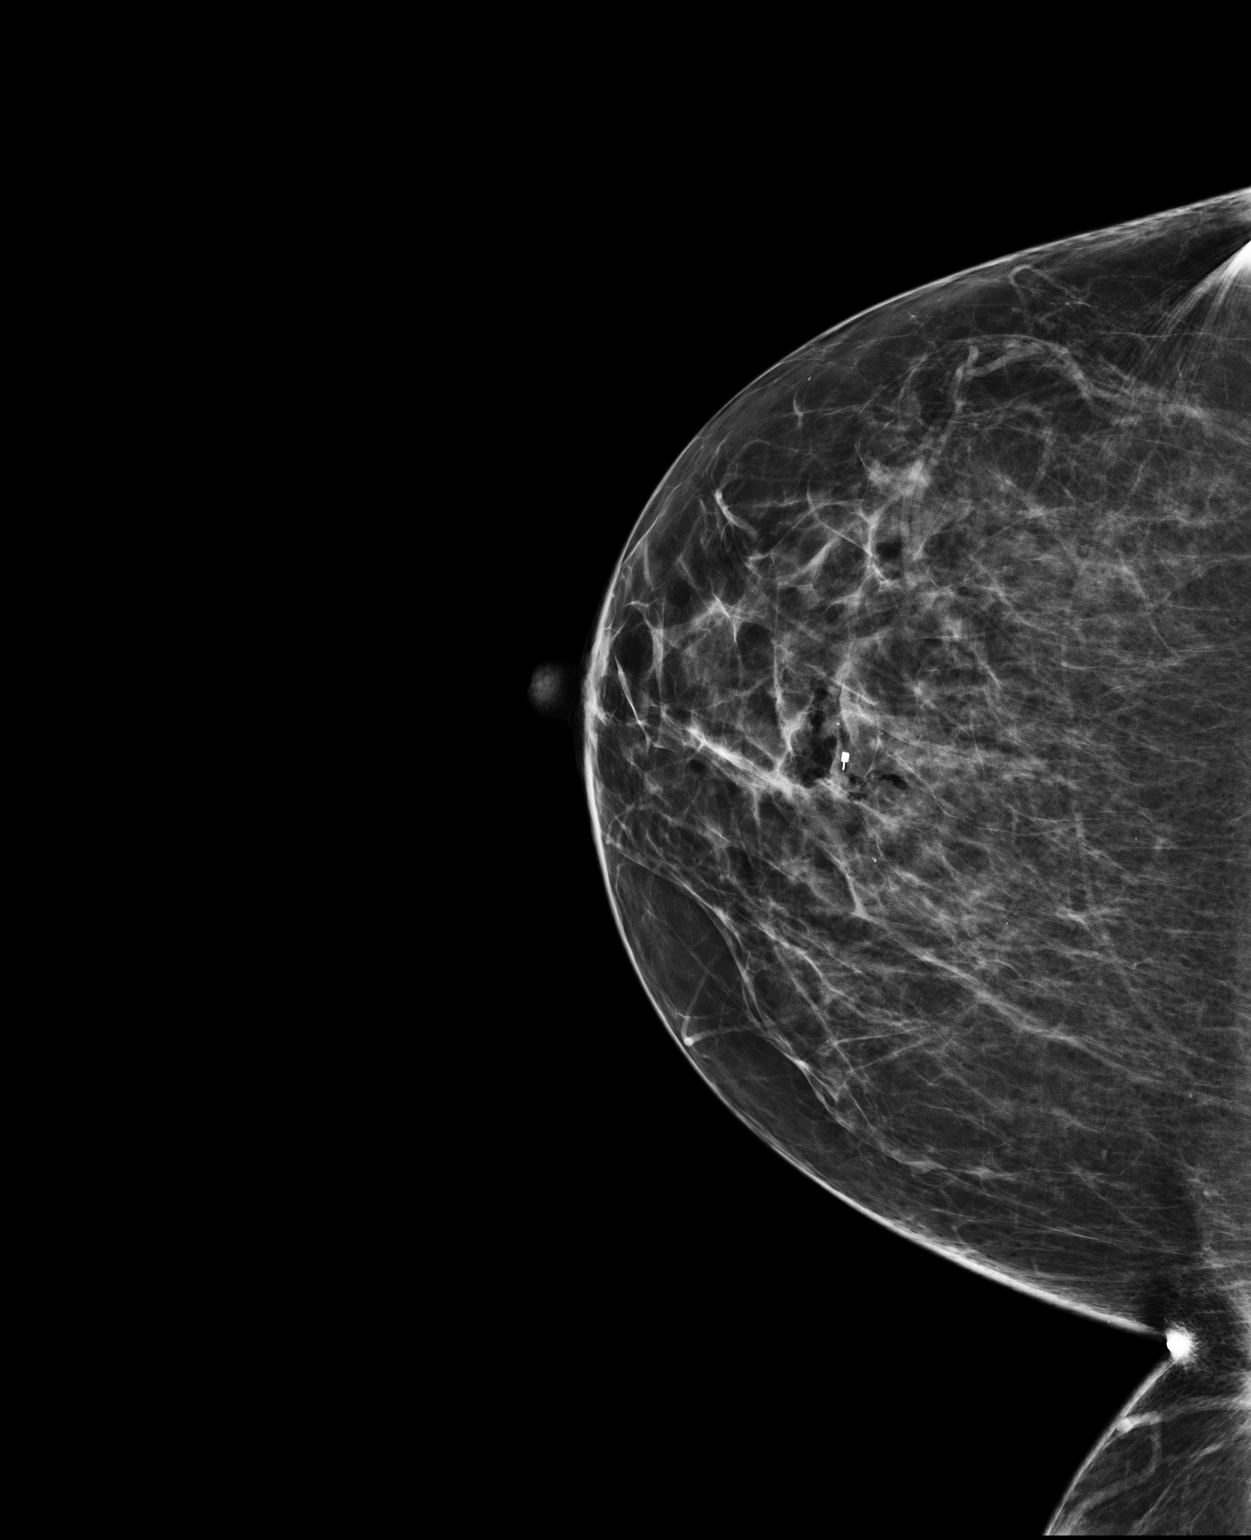

[2 of 2 positions shown; findings below may reference images not displayed]



Using sterile technique and 2% Lidocaine as local anesthetic, under
stereotactic guidance, a 9 gauge vacuum assist device was used to
perform core needle biopsy of calcifications in the upper, outer
right breast using a superior to inferior approach. Specimen
radiograph was performed showing calcifications within the specimen.
Specimens with calcifications are identified for pathology.

At the conclusion of the procedure, a coil shaped tissue marker clip
was deployed into the biopsy cavity. Follow-up 2-view mammogram was
performed and dictated separately.
IMPRESSION: Stereotactic-guided biopsy of indeterminate right breast
calcifications. No apparent complications.

## 2015-04-08 IMAGING — MG MM BREAST BX W/ LOC DEV 1ST LESION IMAGE BX SPEC STEREO GUIDE*R*
1 series · 1 of 1 positions shown · non-contrast
Comparison: Previous exams.

ADDENDUM:
Pathology revealed a Right Breast low grade Ductal Carcinoma In Situ
with calcifications. This was found to be concordant by Dr. MITHAT DZEKA
MITHAT DZEKA. Pathology was discussed with the patient via telephone. The
patient reported no problems with the biopsy site. Post biopsy
instructions were reviewed and questions were answered. The patient
was encouraged to call The [REDACTED] with
any additional questions and/or concerns. The patient will discuss
surgical referral with Dr. MITHAT DZEKA. In addition to management of
the known sites of carcinoma within each breast, the patient will
also need excision of the suspicious calcifications within the
lower, slightly outer left breast, anterior depth, which are not
amenable to stereotactic biopsy.

Pathology results reported by MITHAT DZEKA RN on [DATE].
CLINICAL DATA: 53-year-old female for biopsy of indeterminate right
breast calcifications
EXAM:
RIGHT BREAST STEREOTACTIC CORE NEEDLE BIOPSY

[R CC]
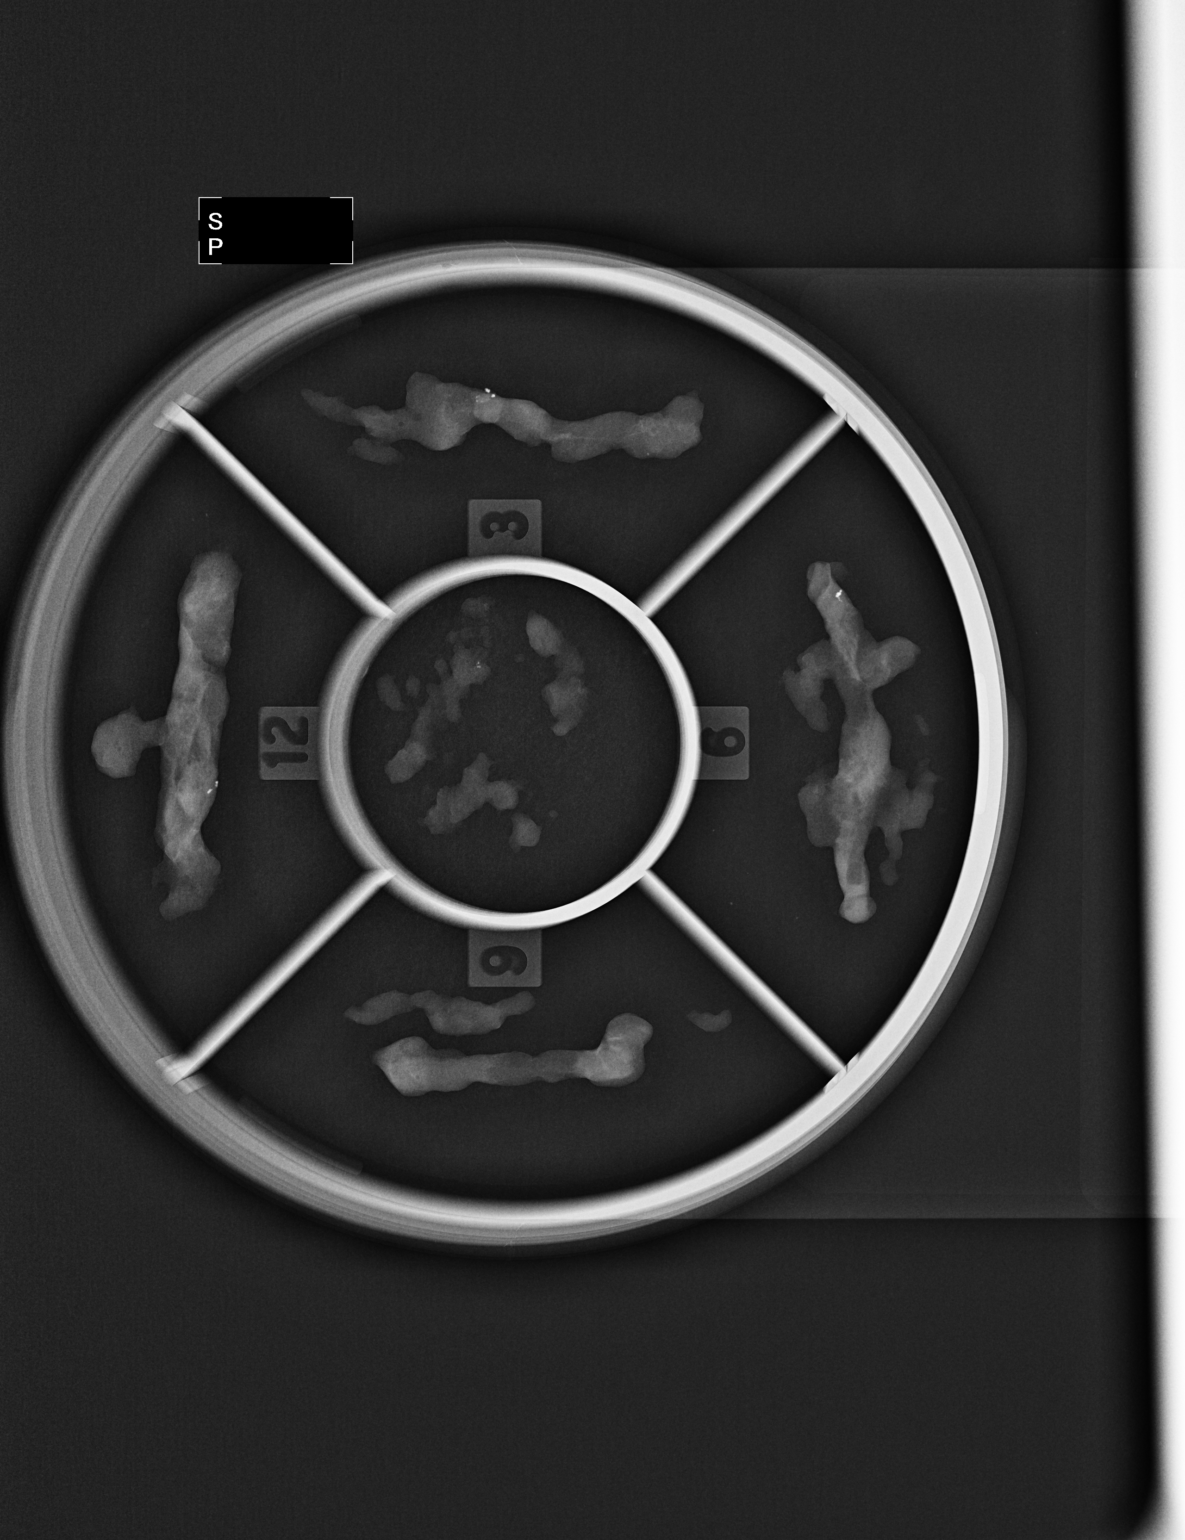

[1 of 1 positions shown; findings below may reference images not displayed]



Using sterile technique and 2% Lidocaine as local anesthetic, under
stereotactic guidance, a 9 gauge vacuum assist device was used to
perform core needle biopsy of calcifications in the upper, outer
right breast using a superior to inferior approach. Specimen
radiograph was performed showing calcifications within the specimen.
Specimens with calcifications are identified for pathology.

At the conclusion of the procedure, a coil shaped tissue marker clip
was deployed into the biopsy cavity. Follow-up 2-view mammogram was
performed and dictated separately.
IMPRESSION: Stereotactic-guided biopsy of indeterminate right breast
calcifications. No apparent complications.

## 2015-04-15 HISTORY — PX: BREAST LUMPECTOMY WITH AXILLARY LYMPH NODE BIOPSY: SHX5593

## 2015-09-11 DIAGNOSIS — Z853 Personal history of malignant neoplasm of breast: Secondary | ICD-10-CM | POA: Insufficient documentation

## 2015-09-11 DIAGNOSIS — Z79811 Long term (current) use of aromatase inhibitors: Secondary | ICD-10-CM | POA: Insufficient documentation

## 2015-09-11 DIAGNOSIS — R748 Abnormal levels of other serum enzymes: Secondary | ICD-10-CM | POA: Insufficient documentation

## 2015-09-11 DIAGNOSIS — C50811 Malignant neoplasm of overlapping sites of right female breast: Secondary | ICD-10-CM | POA: Insufficient documentation

## 2015-09-11 DIAGNOSIS — M8580 Other specified disorders of bone density and structure, unspecified site: Secondary | ICD-10-CM | POA: Insufficient documentation

## 2015-09-11 DIAGNOSIS — Z780 Asymptomatic menopausal state: Secondary | ICD-10-CM | POA: Insufficient documentation

## 2016-04-09 DIAGNOSIS — Z6833 Body mass index (BMI) 33.0-33.9, adult: Secondary | ICD-10-CM | POA: Diagnosis not present

## 2016-04-09 DIAGNOSIS — R600 Localized edema: Secondary | ICD-10-CM | POA: Diagnosis not present

## 2016-04-09 DIAGNOSIS — H1013 Acute atopic conjunctivitis, bilateral: Secondary | ICD-10-CM | POA: Diagnosis not present

## 2016-05-11 DIAGNOSIS — E782 Mixed hyperlipidemia: Secondary | ICD-10-CM | POA: Diagnosis not present

## 2016-05-11 DIAGNOSIS — R600 Localized edema: Secondary | ICD-10-CM | POA: Diagnosis not present

## 2016-05-11 DIAGNOSIS — E7800 Pure hypercholesterolemia, unspecified: Secondary | ICD-10-CM | POA: Diagnosis not present

## 2016-05-11 DIAGNOSIS — E039 Hypothyroidism, unspecified: Secondary | ICD-10-CM | POA: Diagnosis not present

## 2016-05-15 DIAGNOSIS — R740 Nonspecific elevation of levels of transaminase and lactic acid dehydrogenase [LDH]: Secondary | ICD-10-CM | POA: Diagnosis not present

## 2016-05-15 DIAGNOSIS — E782 Mixed hyperlipidemia: Secondary | ICD-10-CM | POA: Diagnosis not present

## 2016-05-15 DIAGNOSIS — E039 Hypothyroidism, unspecified: Secondary | ICD-10-CM | POA: Diagnosis not present

## 2016-07-07 DIAGNOSIS — Z6831 Body mass index (BMI) 31.0-31.9, adult: Secondary | ICD-10-CM | POA: Diagnosis not present

## 2016-07-07 DIAGNOSIS — S46911A Strain of unspecified muscle, fascia and tendon at shoulder and upper arm level, right arm, initial encounter: Secondary | ICD-10-CM | POA: Diagnosis not present

## 2016-09-16 DIAGNOSIS — E782 Mixed hyperlipidemia: Secondary | ICD-10-CM | POA: Diagnosis not present

## 2016-09-16 DIAGNOSIS — F325 Major depressive disorder, single episode, in full remission: Secondary | ICD-10-CM | POA: Diagnosis not present

## 2016-09-16 DIAGNOSIS — E039 Hypothyroidism, unspecified: Secondary | ICD-10-CM | POA: Diagnosis not present

## 2016-09-16 DIAGNOSIS — E7800 Pure hypercholesterolemia, unspecified: Secondary | ICD-10-CM | POA: Diagnosis not present

## 2016-09-18 DIAGNOSIS — E782 Mixed hyperlipidemia: Secondary | ICD-10-CM | POA: Diagnosis not present

## 2016-09-18 DIAGNOSIS — E039 Hypothyroidism, unspecified: Secondary | ICD-10-CM | POA: Diagnosis not present

## 2016-09-18 DIAGNOSIS — F411 Generalized anxiety disorder: Secondary | ICD-10-CM | POA: Diagnosis not present

## 2016-09-18 DIAGNOSIS — Z23 Encounter for immunization: Secondary | ICD-10-CM | POA: Diagnosis not present

## 2016-09-18 DIAGNOSIS — R740 Nonspecific elevation of levels of transaminase and lactic acid dehydrogenase [LDH]: Secondary | ICD-10-CM | POA: Diagnosis not present

## 2016-10-21 DIAGNOSIS — Z853 Personal history of malignant neoplasm of breast: Secondary | ICD-10-CM | POA: Diagnosis not present

## 2016-10-21 DIAGNOSIS — R921 Mammographic calcification found on diagnostic imaging of breast: Secondary | ICD-10-CM | POA: Diagnosis not present

## 2017-01-18 DIAGNOSIS — E039 Hypothyroidism, unspecified: Secondary | ICD-10-CM | POA: Diagnosis not present

## 2017-01-18 DIAGNOSIS — C50912 Malignant neoplasm of unspecified site of left female breast: Secondary | ICD-10-CM | POA: Diagnosis not present

## 2017-01-18 DIAGNOSIS — Z923 Personal history of irradiation: Secondary | ICD-10-CM | POA: Diagnosis not present

## 2017-01-18 DIAGNOSIS — R740 Nonspecific elevation of levels of transaminase and lactic acid dehydrogenase [LDH]: Secondary | ICD-10-CM | POA: Diagnosis not present

## 2017-01-18 DIAGNOSIS — E782 Mixed hyperlipidemia: Secondary | ICD-10-CM | POA: Diagnosis not present

## 2017-01-18 DIAGNOSIS — Z79811 Long term (current) use of aromatase inhibitors: Secondary | ICD-10-CM | POA: Diagnosis not present

## 2017-01-18 DIAGNOSIS — R5383 Other fatigue: Secondary | ICD-10-CM | POA: Diagnosis not present

## 2017-01-18 DIAGNOSIS — D0511 Intraductal carcinoma in situ of right breast: Secondary | ICD-10-CM | POA: Diagnosis not present

## 2017-01-18 DIAGNOSIS — R938 Abnormal findings on diagnostic imaging of other specified body structures: Secondary | ICD-10-CM | POA: Diagnosis not present

## 2017-01-21 DIAGNOSIS — E782 Mixed hyperlipidemia: Secondary | ICD-10-CM | POA: Diagnosis not present

## 2017-01-21 DIAGNOSIS — F411 Generalized anxiety disorder: Secondary | ICD-10-CM | POA: Diagnosis not present

## 2017-01-21 DIAGNOSIS — E039 Hypothyroidism, unspecified: Secondary | ICD-10-CM | POA: Diagnosis not present

## 2017-01-21 DIAGNOSIS — Z1212 Encounter for screening for malignant neoplasm of rectum: Secondary | ICD-10-CM | POA: Diagnosis not present

## 2017-01-21 DIAGNOSIS — R740 Nonspecific elevation of levels of transaminase and lactic acid dehydrogenase [LDH]: Secondary | ICD-10-CM | POA: Diagnosis not present

## 2017-02-16 DIAGNOSIS — Z800 Family history of malignant neoplasm of digestive organs: Secondary | ICD-10-CM | POA: Diagnosis not present

## 2017-02-19 DIAGNOSIS — Z881 Allergy status to other antibiotic agents status: Secondary | ICD-10-CM | POA: Diagnosis not present

## 2017-02-19 DIAGNOSIS — Z801 Family history of malignant neoplasm of trachea, bronchus and lung: Secondary | ICD-10-CM | POA: Diagnosis not present

## 2017-02-19 DIAGNOSIS — Z853 Personal history of malignant neoplasm of breast: Secondary | ICD-10-CM | POA: Diagnosis not present

## 2017-02-19 DIAGNOSIS — E039 Hypothyroidism, unspecified: Secondary | ICD-10-CM | POA: Diagnosis not present

## 2017-02-19 DIAGNOSIS — Z8261 Family history of arthritis: Secondary | ICD-10-CM | POA: Diagnosis not present

## 2017-02-19 DIAGNOSIS — Z1211 Encounter for screening for malignant neoplasm of colon: Secondary | ICD-10-CM | POA: Diagnosis not present

## 2017-02-19 DIAGNOSIS — R195 Other fecal abnormalities: Secondary | ICD-10-CM | POA: Diagnosis not present

## 2017-02-19 DIAGNOSIS — Z800 Family history of malignant neoplasm of digestive organs: Secondary | ICD-10-CM | POA: Diagnosis not present

## 2017-02-19 DIAGNOSIS — Z7951 Long term (current) use of inhaled steroids: Secondary | ICD-10-CM | POA: Diagnosis not present

## 2017-02-19 DIAGNOSIS — Z9049 Acquired absence of other specified parts of digestive tract: Secondary | ICD-10-CM | POA: Diagnosis not present

## 2017-02-19 DIAGNOSIS — Z79899 Other long term (current) drug therapy: Secondary | ICD-10-CM | POA: Diagnosis not present

## 2017-02-19 DIAGNOSIS — Z836 Family history of other diseases of the respiratory system: Secondary | ICD-10-CM | POA: Diagnosis not present

## 2017-02-19 DIAGNOSIS — Z85038 Personal history of other malignant neoplasm of large intestine: Secondary | ICD-10-CM | POA: Diagnosis not present

## 2017-04-04 DIAGNOSIS — S0990XA Unspecified injury of head, initial encounter: Secondary | ICD-10-CM | POA: Diagnosis not present

## 2017-04-04 DIAGNOSIS — E039 Hypothyroidism, unspecified: Secondary | ICD-10-CM | POA: Diagnosis not present

## 2017-04-04 DIAGNOSIS — C50911 Malignant neoplasm of unspecified site of right female breast: Secondary | ICD-10-CM | POA: Diagnosis not present

## 2017-04-04 DIAGNOSIS — R51 Headache: Secondary | ICD-10-CM | POA: Diagnosis not present

## 2017-04-04 DIAGNOSIS — Z79899 Other long term (current) drug therapy: Secondary | ICD-10-CM | POA: Diagnosis not present

## 2017-04-04 DIAGNOSIS — F329 Major depressive disorder, single episode, unspecified: Secondary | ICD-10-CM | POA: Diagnosis not present

## 2017-04-04 DIAGNOSIS — S79911A Unspecified injury of right hip, initial encounter: Secondary | ICD-10-CM | POA: Diagnosis not present

## 2017-04-04 DIAGNOSIS — Y9301 Activity, walking, marching and hiking: Secondary | ICD-10-CM | POA: Diagnosis not present

## 2017-04-04 DIAGNOSIS — C50912 Malignant neoplasm of unspecified site of left female breast: Secondary | ICD-10-CM | POA: Diagnosis not present

## 2017-04-04 DIAGNOSIS — S62622A Displaced fracture of medial phalanx of right middle finger, initial encounter for closed fracture: Secondary | ICD-10-CM | POA: Diagnosis not present

## 2017-04-04 DIAGNOSIS — Y92008 Other place in unspecified non-institutional (private) residence as the place of occurrence of the external cause: Secondary | ICD-10-CM | POA: Diagnosis not present

## 2017-04-04 DIAGNOSIS — E7800 Pure hypercholesterolemia, unspecified: Secondary | ICD-10-CM | POA: Diagnosis not present

## 2017-04-04 DIAGNOSIS — S62660A Nondisplaced fracture of distal phalanx of right index finger, initial encounter for closed fracture: Secondary | ICD-10-CM | POA: Diagnosis not present

## 2017-04-04 DIAGNOSIS — S0993XA Unspecified injury of face, initial encounter: Secondary | ICD-10-CM | POA: Diagnosis not present

## 2017-04-04 DIAGNOSIS — S0083XA Contusion of other part of head, initial encounter: Secondary | ICD-10-CM | POA: Diagnosis not present

## 2017-04-04 DIAGNOSIS — S199XXA Unspecified injury of neck, initial encounter: Secondary | ICD-10-CM | POA: Diagnosis not present

## 2017-04-04 DIAGNOSIS — R220 Localized swelling, mass and lump, head: Secondary | ICD-10-CM | POA: Diagnosis not present

## 2017-04-04 DIAGNOSIS — M25551 Pain in right hip: Secondary | ICD-10-CM | POA: Diagnosis not present

## 2017-04-04 DIAGNOSIS — S0081XA Abrasion of other part of head, initial encounter: Secondary | ICD-10-CM | POA: Diagnosis not present

## 2017-04-04 DIAGNOSIS — W109XXA Fall (on) (from) unspecified stairs and steps, initial encounter: Secondary | ICD-10-CM | POA: Diagnosis not present

## 2017-04-07 DIAGNOSIS — Z6832 Body mass index (BMI) 32.0-32.9, adult: Secondary | ICD-10-CM | POA: Diagnosis not present

## 2017-04-07 DIAGNOSIS — S40021A Contusion of right upper arm, initial encounter: Secondary | ICD-10-CM | POA: Diagnosis not present

## 2017-04-07 DIAGNOSIS — S62609A Fracture of unspecified phalanx of unspecified finger, initial encounter for closed fracture: Secondary | ICD-10-CM | POA: Diagnosis not present

## 2017-04-07 DIAGNOSIS — S7001XA Contusion of right hip, initial encounter: Secondary | ICD-10-CM | POA: Diagnosis not present

## 2017-04-15 DIAGNOSIS — H11153 Pinguecula, bilateral: Secondary | ICD-10-CM | POA: Diagnosis not present

## 2017-05-05 DIAGNOSIS — C50911 Malignant neoplasm of unspecified site of right female breast: Secondary | ICD-10-CM | POA: Diagnosis not present

## 2017-05-05 DIAGNOSIS — R928 Other abnormal and inconclusive findings on diagnostic imaging of breast: Secondary | ICD-10-CM | POA: Diagnosis not present

## 2017-05-05 DIAGNOSIS — R921 Mammographic calcification found on diagnostic imaging of breast: Secondary | ICD-10-CM | POA: Diagnosis not present

## 2017-05-05 DIAGNOSIS — C50912 Malignant neoplasm of unspecified site of left female breast: Secondary | ICD-10-CM | POA: Diagnosis not present

## 2017-05-11 DIAGNOSIS — Z923 Personal history of irradiation: Secondary | ICD-10-CM | POA: Diagnosis not present

## 2017-05-11 DIAGNOSIS — C50412 Malignant neoplasm of upper-outer quadrant of left female breast: Secondary | ICD-10-CM | POA: Diagnosis not present

## 2017-05-11 DIAGNOSIS — D0511 Intraductal carcinoma in situ of right breast: Secondary | ICD-10-CM | POA: Diagnosis not present

## 2017-05-11 DIAGNOSIS — Z170 Estrogen receptor positive status [ER+]: Secondary | ICD-10-CM | POA: Diagnosis not present

## 2017-05-20 DIAGNOSIS — E8940 Asymptomatic postprocedural ovarian failure: Secondary | ICD-10-CM | POA: Diagnosis not present

## 2017-05-20 DIAGNOSIS — F329 Major depressive disorder, single episode, unspecified: Secondary | ICD-10-CM | POA: Diagnosis not present

## 2017-05-20 DIAGNOSIS — Z780 Asymptomatic menopausal state: Secondary | ICD-10-CM | POA: Diagnosis not present

## 2017-05-20 DIAGNOSIS — E785 Hyperlipidemia, unspecified: Secondary | ICD-10-CM | POA: Diagnosis not present

## 2017-05-20 DIAGNOSIS — Z170 Estrogen receptor positive status [ER+]: Secondary | ICD-10-CM | POA: Diagnosis not present

## 2017-05-20 DIAGNOSIS — D0511 Intraductal carcinoma in situ of right breast: Secondary | ICD-10-CM | POA: Diagnosis not present

## 2017-05-20 DIAGNOSIS — M8580 Other specified disorders of bone density and structure, unspecified site: Secondary | ICD-10-CM | POA: Diagnosis not present

## 2017-05-20 DIAGNOSIS — C50412 Malignant neoplasm of upper-outer quadrant of left female breast: Secondary | ICD-10-CM | POA: Diagnosis not present

## 2017-05-20 DIAGNOSIS — R296 Repeated falls: Secondary | ICD-10-CM | POA: Diagnosis not present

## 2017-05-20 DIAGNOSIS — Z6832 Body mass index (BMI) 32.0-32.9, adult: Secondary | ICD-10-CM | POA: Diagnosis not present

## 2017-06-02 DIAGNOSIS — F32A Depression, unspecified: Secondary | ICD-10-CM | POA: Insufficient documentation

## 2017-06-02 DIAGNOSIS — E785 Hyperlipidemia, unspecified: Secondary | ICD-10-CM | POA: Insufficient documentation

## 2017-06-02 DIAGNOSIS — M8580 Other specified disorders of bone density and structure, unspecified site: Secondary | ICD-10-CM | POA: Insufficient documentation

## 2017-06-14 DIAGNOSIS — C50412 Malignant neoplasm of upper-outer quadrant of left female breast: Secondary | ICD-10-CM | POA: Diagnosis not present

## 2017-06-14 DIAGNOSIS — Z801 Family history of malignant neoplasm of trachea, bronchus and lung: Secondary | ICD-10-CM | POA: Diagnosis not present

## 2017-06-14 DIAGNOSIS — Z800 Family history of malignant neoplasm of digestive organs: Secondary | ICD-10-CM | POA: Diagnosis not present

## 2017-06-14 DIAGNOSIS — Z853 Personal history of malignant neoplasm of breast: Secondary | ICD-10-CM | POA: Diagnosis not present

## 2017-06-14 DIAGNOSIS — D0511 Intraductal carcinoma in situ of right breast: Secondary | ICD-10-CM | POA: Diagnosis not present

## 2017-06-14 DIAGNOSIS — Z808 Family history of malignant neoplasm of other organs or systems: Secondary | ICD-10-CM | POA: Diagnosis not present

## 2017-06-14 DIAGNOSIS — Z170 Estrogen receptor positive status [ER+]: Secondary | ICD-10-CM | POA: Diagnosis not present

## 2017-06-14 DIAGNOSIS — Z780 Asymptomatic menopausal state: Secondary | ICD-10-CM | POA: Diagnosis not present

## 2017-06-21 DIAGNOSIS — Z923 Personal history of irradiation: Secondary | ICD-10-CM | POA: Diagnosis not present

## 2017-06-21 DIAGNOSIS — E7800 Pure hypercholesterolemia, unspecified: Secondary | ICD-10-CM | POA: Diagnosis not present

## 2017-06-21 DIAGNOSIS — M8580 Other specified disorders of bone density and structure, unspecified site: Secondary | ICD-10-CM | POA: Diagnosis not present

## 2017-06-21 DIAGNOSIS — C50919 Malignant neoplasm of unspecified site of unspecified female breast: Secondary | ICD-10-CM | POA: Diagnosis not present

## 2017-06-21 DIAGNOSIS — Z780 Asymptomatic menopausal state: Secondary | ICD-10-CM | POA: Diagnosis not present

## 2017-06-21 DIAGNOSIS — E039 Hypothyroidism, unspecified: Secondary | ICD-10-CM | POA: Diagnosis not present

## 2017-06-21 DIAGNOSIS — M85851 Other specified disorders of bone density and structure, right thigh: Secondary | ICD-10-CM | POA: Diagnosis not present

## 2017-06-21 DIAGNOSIS — Z79899 Other long term (current) drug therapy: Secondary | ICD-10-CM | POA: Diagnosis not present

## 2017-08-02 DIAGNOSIS — Z6832 Body mass index (BMI) 32.0-32.9, adult: Secondary | ICD-10-CM | POA: Diagnosis not present

## 2017-08-02 DIAGNOSIS — Z170 Estrogen receptor positive status [ER+]: Secondary | ICD-10-CM | POA: Diagnosis not present

## 2017-08-02 DIAGNOSIS — C50212 Malignant neoplasm of upper-inner quadrant of left female breast: Secondary | ICD-10-CM | POA: Diagnosis not present

## 2017-08-11 DIAGNOSIS — F411 Generalized anxiety disorder: Secondary | ICD-10-CM | POA: Diagnosis not present

## 2017-08-11 DIAGNOSIS — E782 Mixed hyperlipidemia: Secondary | ICD-10-CM | POA: Diagnosis not present

## 2017-08-11 DIAGNOSIS — R740 Nonspecific elevation of levels of transaminase and lactic acid dehydrogenase [LDH]: Secondary | ICD-10-CM | POA: Diagnosis not present

## 2017-08-11 DIAGNOSIS — E039 Hypothyroidism, unspecified: Secondary | ICD-10-CM | POA: Diagnosis not present

## 2017-11-16 DIAGNOSIS — Z6832 Body mass index (BMI) 32.0-32.9, adult: Secondary | ICD-10-CM | POA: Diagnosis not present

## 2017-11-16 DIAGNOSIS — J189 Pneumonia, unspecified organism: Secondary | ICD-10-CM | POA: Diagnosis not present

## 2017-11-23 DIAGNOSIS — Z79899 Other long term (current) drug therapy: Secondary | ICD-10-CM | POA: Diagnosis not present

## 2017-11-23 DIAGNOSIS — E039 Hypothyroidism, unspecified: Secondary | ICD-10-CM | POA: Diagnosis not present

## 2017-11-23 DIAGNOSIS — M85851 Other specified disorders of bone density and structure, right thigh: Secondary | ICD-10-CM | POA: Diagnosis not present

## 2017-11-23 DIAGNOSIS — Z79811 Long term (current) use of aromatase inhibitors: Secondary | ICD-10-CM | POA: Diagnosis not present

## 2017-11-23 DIAGNOSIS — C50912 Malignant neoplasm of unspecified site of left female breast: Secondary | ICD-10-CM | POA: Diagnosis not present

## 2017-11-23 DIAGNOSIS — C50412 Malignant neoplasm of upper-outer quadrant of left female breast: Secondary | ICD-10-CM | POA: Diagnosis not present

## 2017-11-23 DIAGNOSIS — E785 Hyperlipidemia, unspecified: Secondary | ICD-10-CM | POA: Diagnosis not present

## 2017-11-23 DIAGNOSIS — M85852 Other specified disorders of bone density and structure, left thigh: Secondary | ICD-10-CM | POA: Diagnosis not present

## 2017-11-23 DIAGNOSIS — Z801 Family history of malignant neoplasm of trachea, bronchus and lung: Secondary | ICD-10-CM | POA: Diagnosis not present

## 2017-11-23 DIAGNOSIS — F329 Major depressive disorder, single episode, unspecified: Secondary | ICD-10-CM | POA: Diagnosis not present

## 2017-11-23 DIAGNOSIS — J309 Allergic rhinitis, unspecified: Secondary | ICD-10-CM | POA: Diagnosis not present

## 2017-11-23 DIAGNOSIS — Z800 Family history of malignant neoplasm of digestive organs: Secondary | ICD-10-CM | POA: Diagnosis not present

## 2017-11-23 DIAGNOSIS — Z853 Personal history of malignant neoplasm of breast: Secondary | ICD-10-CM | POA: Diagnosis not present

## 2017-11-23 DIAGNOSIS — Z08 Encounter for follow-up examination after completed treatment for malignant neoplasm: Secondary | ICD-10-CM | POA: Diagnosis not present

## 2017-11-23 DIAGNOSIS — Z170 Estrogen receptor positive status [ER+]: Secondary | ICD-10-CM | POA: Diagnosis not present

## 2017-11-23 DIAGNOSIS — Z9889 Other specified postprocedural states: Secondary | ICD-10-CM | POA: Diagnosis not present

## 2017-11-23 DIAGNOSIS — Z9049 Acquired absence of other specified parts of digestive tract: Secondary | ICD-10-CM | POA: Diagnosis not present

## 2017-11-23 DIAGNOSIS — C50911 Malignant neoplasm of unspecified site of right female breast: Secondary | ICD-10-CM | POA: Diagnosis not present

## 2017-11-23 DIAGNOSIS — K529 Noninfective gastroenteritis and colitis, unspecified: Secondary | ICD-10-CM | POA: Diagnosis not present

## 2017-11-23 DIAGNOSIS — M810 Age-related osteoporosis without current pathological fracture: Secondary | ICD-10-CM | POA: Diagnosis not present

## 2017-11-23 DIAGNOSIS — Z923 Personal history of irradiation: Secondary | ICD-10-CM | POA: Diagnosis not present

## 2017-12-01 DIAGNOSIS — Z710 Person encountering health services to consult on behalf of another person: Secondary | ICD-10-CM | POA: Diagnosis not present

## 2017-12-01 DIAGNOSIS — Z08 Encounter for follow-up examination after completed treatment for malignant neoplasm: Secondary | ICD-10-CM | POA: Diagnosis not present

## 2017-12-01 DIAGNOSIS — Z853 Personal history of malignant neoplasm of breast: Secondary | ICD-10-CM | POA: Diagnosis not present

## 2017-12-01 DIAGNOSIS — R928 Other abnormal and inconclusive findings on diagnostic imaging of breast: Secondary | ICD-10-CM | POA: Diagnosis not present

## 2017-12-03 DIAGNOSIS — Z23 Encounter for immunization: Secondary | ICD-10-CM | POA: Diagnosis not present

## 2017-12-03 DIAGNOSIS — E782 Mixed hyperlipidemia: Secondary | ICD-10-CM | POA: Diagnosis not present

## 2017-12-03 DIAGNOSIS — R740 Nonspecific elevation of levels of transaminase and lactic acid dehydrogenase [LDH]: Secondary | ICD-10-CM | POA: Diagnosis not present

## 2017-12-03 DIAGNOSIS — F411 Generalized anxiety disorder: Secondary | ICD-10-CM | POA: Diagnosis not present

## 2017-12-03 DIAGNOSIS — E039 Hypothyroidism, unspecified: Secondary | ICD-10-CM | POA: Diagnosis not present

## 2018-02-17 DIAGNOSIS — Z6832 Body mass index (BMI) 32.0-32.9, adult: Secondary | ICD-10-CM | POA: Diagnosis not present

## 2018-02-17 DIAGNOSIS — R5383 Other fatigue: Secondary | ICD-10-CM | POA: Diagnosis not present

## 2018-03-08 DIAGNOSIS — D0511 Intraductal carcinoma in situ of right breast: Secondary | ICD-10-CM | POA: Diagnosis not present

## 2018-03-08 DIAGNOSIS — Z6835 Body mass index (BMI) 35.0-35.9, adult: Secondary | ICD-10-CM | POA: Diagnosis not present

## 2018-04-20 DIAGNOSIS — G4733 Obstructive sleep apnea (adult) (pediatric): Secondary | ICD-10-CM | POA: Diagnosis not present

## 2018-04-20 DIAGNOSIS — R0683 Snoring: Secondary | ICD-10-CM | POA: Diagnosis not present

## 2018-05-03 DIAGNOSIS — G4733 Obstructive sleep apnea (adult) (pediatric): Secondary | ICD-10-CM | POA: Diagnosis not present

## 2018-05-24 DIAGNOSIS — D0511 Intraductal carcinoma in situ of right breast: Secondary | ICD-10-CM | POA: Diagnosis not present

## 2018-05-24 DIAGNOSIS — Z6834 Body mass index (BMI) 34.0-34.9, adult: Secondary | ICD-10-CM | POA: Diagnosis not present

## 2018-05-24 DIAGNOSIS — Z170 Estrogen receptor positive status [ER+]: Secondary | ICD-10-CM | POA: Diagnosis not present

## 2018-05-24 DIAGNOSIS — C50412 Malignant neoplasm of upper-outer quadrant of left female breast: Secondary | ICD-10-CM | POA: Diagnosis not present

## 2018-06-02 DIAGNOSIS — S92514A Nondisplaced fracture of proximal phalanx of right lesser toe(s), initial encounter for closed fracture: Secondary | ICD-10-CM | POA: Diagnosis not present

## 2018-06-02 DIAGNOSIS — Z6832 Body mass index (BMI) 32.0-32.9, adult: Secondary | ICD-10-CM | POA: Diagnosis not present

## 2018-06-03 DIAGNOSIS — E039 Hypothyroidism, unspecified: Secondary | ICD-10-CM | POA: Diagnosis not present

## 2018-06-03 DIAGNOSIS — R5383 Other fatigue: Secondary | ICD-10-CM | POA: Diagnosis not present

## 2018-06-03 DIAGNOSIS — E782 Mixed hyperlipidemia: Secondary | ICD-10-CM | POA: Diagnosis not present

## 2018-06-03 DIAGNOSIS — G4733 Obstructive sleep apnea (adult) (pediatric): Secondary | ICD-10-CM | POA: Diagnosis not present

## 2018-06-03 DIAGNOSIS — G6289 Other specified polyneuropathies: Secondary | ICD-10-CM | POA: Diagnosis not present

## 2018-06-03 DIAGNOSIS — F411 Generalized anxiety disorder: Secondary | ICD-10-CM | POA: Diagnosis not present

## 2018-06-09 DIAGNOSIS — E782 Mixed hyperlipidemia: Secondary | ICD-10-CM | POA: Diagnosis not present

## 2018-06-09 DIAGNOSIS — F411 Generalized anxiety disorder: Secondary | ICD-10-CM | POA: Diagnosis not present

## 2018-06-09 DIAGNOSIS — R740 Nonspecific elevation of levels of transaminase and lactic acid dehydrogenase [LDH]: Secondary | ICD-10-CM | POA: Diagnosis not present

## 2018-06-09 DIAGNOSIS — E039 Hypothyroidism, unspecified: Secondary | ICD-10-CM | POA: Diagnosis not present

## 2018-07-03 DIAGNOSIS — G4733 Obstructive sleep apnea (adult) (pediatric): Secondary | ICD-10-CM | POA: Diagnosis not present

## 2018-09-23 DIAGNOSIS — G4730 Sleep apnea, unspecified: Secondary | ICD-10-CM | POA: Diagnosis not present

## 2018-09-23 DIAGNOSIS — F329 Major depressive disorder, single episode, unspecified: Secondary | ICD-10-CM | POA: Diagnosis not present

## 2018-09-23 DIAGNOSIS — Z79811 Long term (current) use of aromatase inhibitors: Secondary | ICD-10-CM | POA: Diagnosis not present

## 2018-09-23 DIAGNOSIS — I2510 Atherosclerotic heart disease of native coronary artery without angina pectoris: Secondary | ICD-10-CM | POA: Diagnosis not present

## 2018-09-23 DIAGNOSIS — Z9049 Acquired absence of other specified parts of digestive tract: Secondary | ICD-10-CM | POA: Diagnosis not present

## 2018-09-23 DIAGNOSIS — M8580 Other specified disorders of bone density and structure, unspecified site: Secondary | ICD-10-CM | POA: Diagnosis not present

## 2018-09-23 DIAGNOSIS — Z08 Encounter for follow-up examination after completed treatment for malignant neoplasm: Secondary | ICD-10-CM | POA: Diagnosis not present

## 2018-09-23 DIAGNOSIS — G4733 Obstructive sleep apnea (adult) (pediatric): Secondary | ICD-10-CM | POA: Insufficient documentation

## 2018-09-23 DIAGNOSIS — Z853 Personal history of malignant neoplasm of breast: Secondary | ICD-10-CM | POA: Diagnosis not present

## 2018-09-23 DIAGNOSIS — E785 Hyperlipidemia, unspecified: Secondary | ICD-10-CM | POA: Diagnosis not present

## 2018-09-23 DIAGNOSIS — E039 Hypothyroidism, unspecified: Secondary | ICD-10-CM | POA: Diagnosis not present

## 2018-09-23 DIAGNOSIS — C50412 Malignant neoplasm of upper-outer quadrant of left female breast: Secondary | ICD-10-CM | POA: Diagnosis not present

## 2018-09-23 DIAGNOSIS — X58XXXA Exposure to other specified factors, initial encounter: Secondary | ICD-10-CM | POA: Diagnosis not present

## 2018-09-23 DIAGNOSIS — Z90710 Acquired absence of both cervix and uterus: Secondary | ICD-10-CM | POA: Diagnosis not present

## 2018-09-23 DIAGNOSIS — M859 Disorder of bone density and structure, unspecified: Secondary | ICD-10-CM | POA: Diagnosis not present

## 2018-12-05 DIAGNOSIS — E7800 Pure hypercholesterolemia, unspecified: Secondary | ICD-10-CM | POA: Diagnosis not present

## 2018-12-05 DIAGNOSIS — E782 Mixed hyperlipidemia: Secondary | ICD-10-CM | POA: Diagnosis not present

## 2018-12-05 DIAGNOSIS — Z853 Personal history of malignant neoplasm of breast: Secondary | ICD-10-CM | POA: Diagnosis not present

## 2018-12-05 DIAGNOSIS — E039 Hypothyroidism, unspecified: Secondary | ICD-10-CM | POA: Diagnosis not present

## 2018-12-07 DIAGNOSIS — R740 Nonspecific elevation of levels of transaminase and lactic acid dehydrogenase [LDH]: Secondary | ICD-10-CM | POA: Diagnosis not present

## 2018-12-07 DIAGNOSIS — E782 Mixed hyperlipidemia: Secondary | ICD-10-CM | POA: Diagnosis not present

## 2018-12-07 DIAGNOSIS — Z1389 Encounter for screening for other disorder: Secondary | ICD-10-CM | POA: Diagnosis not present

## 2018-12-07 DIAGNOSIS — Z853 Personal history of malignant neoplasm of breast: Secondary | ICD-10-CM | POA: Diagnosis not present

## 2018-12-07 DIAGNOSIS — E039 Hypothyroidism, unspecified: Secondary | ICD-10-CM | POA: Diagnosis not present

## 2018-12-07 DIAGNOSIS — Z1331 Encounter for screening for depression: Secondary | ICD-10-CM | POA: Diagnosis not present

## 2018-12-10 DIAGNOSIS — R05 Cough: Secondary | ICD-10-CM | POA: Diagnosis not present

## 2018-12-10 DIAGNOSIS — J0100 Acute maxillary sinusitis, unspecified: Secondary | ICD-10-CM | POA: Diagnosis not present

## 2018-12-10 DIAGNOSIS — Z6832 Body mass index (BMI) 32.0-32.9, adult: Secondary | ICD-10-CM | POA: Diagnosis not present

## 2019-01-04 DIAGNOSIS — D0511 Intraductal carcinoma in situ of right breast: Secondary | ICD-10-CM | POA: Diagnosis not present

## 2019-01-04 DIAGNOSIS — C50412 Malignant neoplasm of upper-outer quadrant of left female breast: Secondary | ICD-10-CM | POA: Diagnosis not present

## 2019-01-04 DIAGNOSIS — Z170 Estrogen receptor positive status [ER+]: Secondary | ICD-10-CM | POA: Diagnosis not present

## 2019-01-04 DIAGNOSIS — R928 Other abnormal and inconclusive findings on diagnostic imaging of breast: Secondary | ICD-10-CM | POA: Diagnosis not present

## 2019-05-29 DIAGNOSIS — R740 Nonspecific elevation of levels of transaminase and lactic acid dehydrogenase [LDH]: Secondary | ICD-10-CM | POA: Diagnosis not present

## 2019-05-29 DIAGNOSIS — E7800 Pure hypercholesterolemia, unspecified: Secondary | ICD-10-CM | POA: Diagnosis not present

## 2019-05-29 DIAGNOSIS — E782 Mixed hyperlipidemia: Secondary | ICD-10-CM | POA: Diagnosis not present

## 2019-05-29 DIAGNOSIS — F411 Generalized anxiety disorder: Secondary | ICD-10-CM | POA: Diagnosis not present

## 2019-05-29 DIAGNOSIS — E039 Hypothyroidism, unspecified: Secondary | ICD-10-CM | POA: Diagnosis not present

## 2019-06-01 DIAGNOSIS — R740 Nonspecific elevation of levels of transaminase and lactic acid dehydrogenase [LDH]: Secondary | ICD-10-CM | POA: Diagnosis not present

## 2019-06-01 DIAGNOSIS — E782 Mixed hyperlipidemia: Secondary | ICD-10-CM | POA: Diagnosis not present

## 2019-06-01 DIAGNOSIS — F411 Generalized anxiety disorder: Secondary | ICD-10-CM | POA: Diagnosis not present

## 2019-06-01 DIAGNOSIS — E039 Hypothyroidism, unspecified: Secondary | ICD-10-CM | POA: Diagnosis not present

## 2019-06-14 DIAGNOSIS — Z6834 Body mass index (BMI) 34.0-34.9, adult: Secondary | ICD-10-CM | POA: Diagnosis not present

## 2019-06-14 DIAGNOSIS — M8580 Other specified disorders of bone density and structure, unspecified site: Secondary | ICD-10-CM | POA: Diagnosis not present

## 2019-06-14 DIAGNOSIS — Z170 Estrogen receptor positive status [ER+]: Secondary | ICD-10-CM | POA: Diagnosis not present

## 2019-06-14 DIAGNOSIS — C50412 Malignant neoplasm of upper-outer quadrant of left female breast: Secondary | ICD-10-CM | POA: Diagnosis not present

## 2019-06-14 DIAGNOSIS — G4733 Obstructive sleep apnea (adult) (pediatric): Secondary | ICD-10-CM | POA: Diagnosis not present

## 2019-06-14 DIAGNOSIS — Z79811 Long term (current) use of aromatase inhibitors: Secondary | ICD-10-CM | POA: Diagnosis not present

## 2019-06-22 DIAGNOSIS — Z79811 Long term (current) use of aromatase inhibitors: Secondary | ICD-10-CM | POA: Diagnosis not present

## 2019-06-22 DIAGNOSIS — M8580 Other specified disorders of bone density and structure, unspecified site: Secondary | ICD-10-CM | POA: Diagnosis not present

## 2019-06-22 DIAGNOSIS — Z170 Estrogen receptor positive status [ER+]: Secondary | ICD-10-CM | POA: Diagnosis not present

## 2019-06-22 DIAGNOSIS — C50412 Malignant neoplasm of upper-outer quadrant of left female breast: Secondary | ICD-10-CM | POA: Diagnosis not present

## 2019-07-13 DIAGNOSIS — M8589 Other specified disorders of bone density and structure, multiple sites: Secondary | ICD-10-CM | POA: Diagnosis not present

## 2019-07-13 DIAGNOSIS — Z79811 Long term (current) use of aromatase inhibitors: Secondary | ICD-10-CM | POA: Diagnosis not present

## 2019-07-13 DIAGNOSIS — M85851 Other specified disorders of bone density and structure, right thigh: Secondary | ICD-10-CM | POA: Diagnosis not present

## 2019-09-22 DIAGNOSIS — M8580 Other specified disorders of bone density and structure, unspecified site: Secondary | ICD-10-CM | POA: Diagnosis not present

## 2019-09-22 DIAGNOSIS — C50412 Malignant neoplasm of upper-outer quadrant of left female breast: Secondary | ICD-10-CM | POA: Diagnosis not present

## 2019-09-22 DIAGNOSIS — D0511 Intraductal carcinoma in situ of right breast: Secondary | ICD-10-CM | POA: Diagnosis not present

## 2019-09-22 DIAGNOSIS — Z170 Estrogen receptor positive status [ER+]: Secondary | ICD-10-CM | POA: Diagnosis not present

## 2019-09-22 DIAGNOSIS — Z780 Asymptomatic menopausal state: Secondary | ICD-10-CM | POA: Diagnosis not present

## 2019-09-29 DIAGNOSIS — Z170 Estrogen receptor positive status [ER+]: Secondary | ICD-10-CM | POA: Diagnosis not present

## 2019-09-29 DIAGNOSIS — Z79811 Long term (current) use of aromatase inhibitors: Secondary | ICD-10-CM | POA: Diagnosis not present

## 2019-09-29 DIAGNOSIS — F329 Major depressive disorder, single episode, unspecified: Secondary | ICD-10-CM | POA: Diagnosis not present

## 2019-09-29 DIAGNOSIS — Z9049 Acquired absence of other specified parts of digestive tract: Secondary | ICD-10-CM | POA: Diagnosis not present

## 2019-09-29 DIAGNOSIS — C50412 Malignant neoplasm of upper-outer quadrant of left female breast: Secondary | ICD-10-CM | POA: Diagnosis not present

## 2019-09-29 DIAGNOSIS — I2510 Atherosclerotic heart disease of native coronary artery without angina pectoris: Secondary | ICD-10-CM | POA: Diagnosis not present

## 2019-09-29 DIAGNOSIS — M8580 Other specified disorders of bone density and structure, unspecified site: Secondary | ICD-10-CM | POA: Diagnosis not present

## 2019-09-29 DIAGNOSIS — Z79899 Other long term (current) drug therapy: Secondary | ICD-10-CM | POA: Diagnosis not present

## 2019-09-29 DIAGNOSIS — F419 Anxiety disorder, unspecified: Secondary | ICD-10-CM | POA: Diagnosis not present

## 2019-09-29 DIAGNOSIS — Z9989 Dependence on other enabling machines and devices: Secondary | ICD-10-CM | POA: Diagnosis not present

## 2019-09-29 DIAGNOSIS — G4730 Sleep apnea, unspecified: Secondary | ICD-10-CM | POA: Diagnosis not present

## 2019-09-29 DIAGNOSIS — Z780 Asymptomatic menopausal state: Secondary | ICD-10-CM | POA: Diagnosis not present

## 2019-09-29 DIAGNOSIS — E039 Hypothyroidism, unspecified: Secondary | ICD-10-CM | POA: Diagnosis not present

## 2019-09-29 DIAGNOSIS — Z90710 Acquired absence of both cervix and uterus: Secondary | ICD-10-CM | POA: Diagnosis not present

## 2019-09-29 DIAGNOSIS — Z7983 Long term (current) use of bisphosphonates: Secondary | ICD-10-CM | POA: Diagnosis not present

## 2019-10-04 DIAGNOSIS — H2513 Age-related nuclear cataract, bilateral: Secondary | ICD-10-CM | POA: Diagnosis not present

## 2019-11-23 DIAGNOSIS — R5383 Other fatigue: Secondary | ICD-10-CM | POA: Diagnosis not present

## 2019-11-23 DIAGNOSIS — E039 Hypothyroidism, unspecified: Secondary | ICD-10-CM | POA: Diagnosis not present

## 2019-11-23 DIAGNOSIS — E7800 Pure hypercholesterolemia, unspecified: Secondary | ICD-10-CM | POA: Diagnosis not present

## 2019-11-23 DIAGNOSIS — E782 Mixed hyperlipidemia: Secondary | ICD-10-CM | POA: Diagnosis not present

## 2019-11-29 DIAGNOSIS — E039 Hypothyroidism, unspecified: Secondary | ICD-10-CM | POA: Diagnosis not present

## 2019-11-29 DIAGNOSIS — Z23 Encounter for immunization: Secondary | ICD-10-CM | POA: Diagnosis not present

## 2019-11-29 DIAGNOSIS — E782 Mixed hyperlipidemia: Secondary | ICD-10-CM | POA: Diagnosis not present

## 2019-11-29 DIAGNOSIS — R7401 Elevation of levels of liver transaminase levels: Secondary | ICD-10-CM | POA: Diagnosis not present

## 2019-11-29 DIAGNOSIS — F411 Generalized anxiety disorder: Secondary | ICD-10-CM | POA: Diagnosis not present

## 2020-01-17 DIAGNOSIS — C50412 Malignant neoplasm of upper-outer quadrant of left female breast: Secondary | ICD-10-CM | POA: Diagnosis not present

## 2020-01-17 DIAGNOSIS — Z170 Estrogen receptor positive status [ER+]: Secondary | ICD-10-CM | POA: Diagnosis not present

## 2020-01-17 DIAGNOSIS — R928 Other abnormal and inconclusive findings on diagnostic imaging of breast: Secondary | ICD-10-CM | POA: Diagnosis not present

## 2020-01-17 DIAGNOSIS — Z79811 Long term (current) use of aromatase inhibitors: Secondary | ICD-10-CM | POA: Diagnosis not present

## 2020-01-26 DIAGNOSIS — Z170 Estrogen receptor positive status [ER+]: Secondary | ICD-10-CM | POA: Diagnosis not present

## 2020-01-26 DIAGNOSIS — E559 Vitamin D deficiency, unspecified: Secondary | ICD-10-CM | POA: Diagnosis not present

## 2020-01-26 DIAGNOSIS — C50412 Malignant neoplasm of upper-outer quadrant of left female breast: Secondary | ICD-10-CM | POA: Diagnosis not present

## 2020-02-02 DIAGNOSIS — G4730 Sleep apnea, unspecified: Secondary | ICD-10-CM | POA: Diagnosis not present

## 2020-02-02 DIAGNOSIS — Z9049 Acquired absence of other specified parts of digestive tract: Secondary | ICD-10-CM | POA: Diagnosis not present

## 2020-02-02 DIAGNOSIS — F419 Anxiety disorder, unspecified: Secondary | ICD-10-CM | POA: Diagnosis not present

## 2020-02-02 DIAGNOSIS — E039 Hypothyroidism, unspecified: Secondary | ICD-10-CM | POA: Diagnosis not present

## 2020-02-02 DIAGNOSIS — Z90710 Acquired absence of both cervix and uterus: Secondary | ICD-10-CM | POA: Diagnosis not present

## 2020-02-02 DIAGNOSIS — Z79899 Other long term (current) drug therapy: Secondary | ICD-10-CM | POA: Diagnosis not present

## 2020-02-02 DIAGNOSIS — F329 Major depressive disorder, single episode, unspecified: Secondary | ICD-10-CM | POA: Diagnosis not present

## 2020-02-02 DIAGNOSIS — Z170 Estrogen receptor positive status [ER+]: Secondary | ICD-10-CM | POA: Diagnosis not present

## 2020-02-02 DIAGNOSIS — E785 Hyperlipidemia, unspecified: Secondary | ICD-10-CM | POA: Diagnosis not present

## 2020-02-02 DIAGNOSIS — Z79811 Long term (current) use of aromatase inhibitors: Secondary | ICD-10-CM | POA: Diagnosis not present

## 2020-02-02 DIAGNOSIS — C50412 Malignant neoplasm of upper-outer quadrant of left female breast: Secondary | ICD-10-CM | POA: Diagnosis not present

## 2020-02-02 DIAGNOSIS — M8580 Other specified disorders of bone density and structure, unspecified site: Secondary | ICD-10-CM | POA: Diagnosis not present

## 2020-02-02 DIAGNOSIS — T458X5A Adverse effect of other primarily systemic and hematological agents, initial encounter: Secondary | ICD-10-CM | POA: Diagnosis not present

## 2020-02-02 DIAGNOSIS — I2510 Atherosclerotic heart disease of native coronary artery without angina pectoris: Secondary | ICD-10-CM | POA: Diagnosis not present

## 2020-03-12 DIAGNOSIS — Z6833 Body mass index (BMI) 33.0-33.9, adult: Secondary | ICD-10-CM | POA: Diagnosis not present

## 2020-03-12 DIAGNOSIS — M25569 Pain in unspecified knee: Secondary | ICD-10-CM | POA: Diagnosis not present

## 2020-04-24 DIAGNOSIS — M5440 Lumbago with sciatica, unspecified side: Secondary | ICD-10-CM | POA: Diagnosis not present

## 2020-04-24 DIAGNOSIS — M545 Low back pain: Secondary | ICD-10-CM | POA: Diagnosis not present

## 2020-04-24 DIAGNOSIS — Z6833 Body mass index (BMI) 33.0-33.9, adult: Secondary | ICD-10-CM | POA: Diagnosis not present

## 2020-04-24 DIAGNOSIS — M1711 Unilateral primary osteoarthritis, right knee: Secondary | ICD-10-CM | POA: Diagnosis not present

## 2020-05-27 DIAGNOSIS — E782 Mixed hyperlipidemia: Secondary | ICD-10-CM | POA: Diagnosis not present

## 2020-05-27 DIAGNOSIS — E7800 Pure hypercholesterolemia, unspecified: Secondary | ICD-10-CM | POA: Diagnosis not present

## 2020-05-27 DIAGNOSIS — R5383 Other fatigue: Secondary | ICD-10-CM | POA: Diagnosis not present

## 2020-05-27 DIAGNOSIS — E039 Hypothyroidism, unspecified: Secondary | ICD-10-CM | POA: Diagnosis not present

## 2020-06-03 DIAGNOSIS — Z6832 Body mass index (BMI) 32.0-32.9, adult: Secondary | ICD-10-CM | POA: Diagnosis not present

## 2020-06-03 DIAGNOSIS — Z1389 Encounter for screening for other disorder: Secondary | ICD-10-CM | POA: Diagnosis not present

## 2020-06-03 DIAGNOSIS — F411 Generalized anxiety disorder: Secondary | ICD-10-CM | POA: Diagnosis not present

## 2020-06-03 DIAGNOSIS — Z23 Encounter for immunization: Secondary | ICD-10-CM | POA: Diagnosis not present

## 2020-06-03 DIAGNOSIS — Z1331 Encounter for screening for depression: Secondary | ICD-10-CM | POA: Diagnosis not present

## 2020-06-03 DIAGNOSIS — Z853 Personal history of malignant neoplasm of breast: Secondary | ICD-10-CM | POA: Diagnosis not present

## 2020-06-03 DIAGNOSIS — F3341 Major depressive disorder, recurrent, in partial remission: Secondary | ICD-10-CM | POA: Diagnosis not present

## 2020-07-01 DIAGNOSIS — Z23 Encounter for immunization: Secondary | ICD-10-CM | POA: Diagnosis not present

## 2020-07-09 DIAGNOSIS — Z6832 Body mass index (BMI) 32.0-32.9, adult: Secondary | ICD-10-CM | POA: Diagnosis not present

## 2020-07-09 DIAGNOSIS — S161XXA Strain of muscle, fascia and tendon at neck level, initial encounter: Secondary | ICD-10-CM | POA: Diagnosis not present

## 2020-11-28 DIAGNOSIS — E039 Hypothyroidism, unspecified: Secondary | ICD-10-CM | POA: Diagnosis not present

## 2020-11-28 DIAGNOSIS — R5383 Other fatigue: Secondary | ICD-10-CM | POA: Diagnosis not present

## 2020-11-28 DIAGNOSIS — R7301 Impaired fasting glucose: Secondary | ICD-10-CM | POA: Diagnosis not present

## 2020-11-28 DIAGNOSIS — E782 Mixed hyperlipidemia: Secondary | ICD-10-CM | POA: Diagnosis not present

## 2022-01-21 DIAGNOSIS — C541 Malignant neoplasm of endometrium: Secondary | ICD-10-CM

## 2022-01-21 HISTORY — DX: Malignant neoplasm of endometrium: C54.1

## 2022-02-12 ENCOUNTER — Telehealth: Payer: Self-pay | Admitting: *Deleted

## 2022-02-12 NOTE — Telephone Encounter (Signed)
Spoke with the patient and scheduled a new patient appt with Dr Berline Lopes for 3/2 at 8:15 am. Patient given the address and phone number for the clinic, along with the policy for mask and visitors

## 2022-02-18 ENCOUNTER — Other Ambulatory Visit: Payer: Self-pay

## 2022-02-18 ENCOUNTER — Telehealth: Payer: Self-pay

## 2022-02-18 ENCOUNTER — Ambulatory Visit
Admission: RE | Admit: 2022-02-18 | Discharge: 2022-02-18 | Disposition: A | Discharge status: Home / Self Care | Payer: Self-pay | Source: Ambulatory Visit | Attending: Gynecologic Oncology | Admitting: Gynecologic Oncology

## 2022-02-18 ENCOUNTER — Encounter: Payer: Self-pay | Admitting: Gynecologic Oncology

## 2022-02-18 DIAGNOSIS — C579 Malignant neoplasm of female genital organ, unspecified: Secondary | ICD-10-CM

## 2022-02-18 NOTE — Telephone Encounter (Signed)
Spoke with patients husband regarding her appointment with Dr. Berline Lopes tomorrow. Appointment has been moved from 08:15 to 11:15. He states he will be coming with his wife to the appointment and agrees to the time change.  ?

## 2022-02-19 ENCOUNTER — Other Ambulatory Visit: Payer: Self-pay

## 2022-02-19 ENCOUNTER — Other Ambulatory Visit (HOSPITAL_COMMUNITY)
Admission: RE | Admit: 2022-02-19 | Discharge: 2022-02-19 | Disposition: A | Discharge status: Home / Self Care | Payer: BC Managed Care – PPO | Source: Ambulatory Visit | Attending: Gynecologic Oncology | Admitting: Gynecologic Oncology

## 2022-02-19 ENCOUNTER — Inpatient Hospital Stay: Payer: BC Managed Care – PPO | Attending: Gynecologic Oncology | Admitting: Gynecologic Oncology

## 2022-02-19 ENCOUNTER — Encounter: Payer: Self-pay | Admitting: Oncology

## 2022-02-19 ENCOUNTER — Encounter: Payer: Self-pay | Admitting: Gynecologic Oncology

## 2022-02-19 VITALS — BP 154/91 | HR 85 | Temp 98.2°F | Resp 18 | Ht 60.04 in | Wt 163.0 lb

## 2022-02-19 DIAGNOSIS — Z800 Family history of malignant neoplasm of digestive organs: Secondary | ICD-10-CM | POA: Insufficient documentation

## 2022-02-19 DIAGNOSIS — Z803 Family history of malignant neoplasm of breast: Secondary | ICD-10-CM | POA: Insufficient documentation

## 2022-02-19 DIAGNOSIS — Z90711 Acquired absence of uterus with remaining cervical stump: Secondary | ICD-10-CM | POA: Insufficient documentation

## 2022-02-19 DIAGNOSIS — F32A Depression, unspecified: Secondary | ICD-10-CM | POA: Diagnosis not present

## 2022-02-19 DIAGNOSIS — Z923 Personal history of irradiation: Secondary | ICD-10-CM | POA: Diagnosis not present

## 2022-02-19 DIAGNOSIS — Z8049 Family history of malignant neoplasm of other genital organs: Secondary | ICD-10-CM | POA: Insufficient documentation

## 2022-02-19 DIAGNOSIS — Z791 Long term (current) use of non-steroidal anti-inflammatories (NSAID): Secondary | ICD-10-CM | POA: Diagnosis not present

## 2022-02-19 DIAGNOSIS — F419 Anxiety disorder, unspecified: Secondary | ICD-10-CM | POA: Diagnosis not present

## 2022-02-19 DIAGNOSIS — C579 Malignant neoplasm of female genital organ, unspecified: Secondary | ICD-10-CM | POA: Insufficient documentation

## 2022-02-19 DIAGNOSIS — E7800 Pure hypercholesterolemia, unspecified: Secondary | ICD-10-CM | POA: Diagnosis not present

## 2022-02-19 DIAGNOSIS — Z780 Asymptomatic menopausal state: Secondary | ICD-10-CM | POA: Diagnosis not present

## 2022-02-19 DIAGNOSIS — Z79811 Long term (current) use of aromatase inhibitors: Secondary | ICD-10-CM | POA: Insufficient documentation

## 2022-02-19 DIAGNOSIS — Z9079 Acquired absence of other genital organ(s): Secondary | ICD-10-CM | POA: Diagnosis not present

## 2022-02-19 DIAGNOSIS — Z8041 Family history of malignant neoplasm of ovary: Secondary | ICD-10-CM | POA: Diagnosis not present

## 2022-02-19 DIAGNOSIS — Z853 Personal history of malignant neoplasm of breast: Secondary | ICD-10-CM

## 2022-02-19 DIAGNOSIS — Z801 Family history of malignant neoplasm of trachea, bronchus and lung: Secondary | ICD-10-CM | POA: Diagnosis not present

## 2022-02-19 DIAGNOSIS — E039 Hypothyroidism, unspecified: Secondary | ICD-10-CM | POA: Insufficient documentation

## 2022-02-19 DIAGNOSIS — Z124 Encounter for screening for malignant neoplasm of cervix: Secondary | ICD-10-CM | POA: Insufficient documentation

## 2022-02-19 DIAGNOSIS — C541 Malignant neoplasm of endometrium: Secondary | ICD-10-CM | POA: Diagnosis not present

## 2022-02-19 DIAGNOSIS — C50919 Malignant neoplasm of unspecified site of unspecified female breast: Secondary | ICD-10-CM | POA: Insufficient documentation

## 2022-02-19 DIAGNOSIS — Z79899 Other long term (current) drug therapy: Secondary | ICD-10-CM | POA: Diagnosis not present

## 2022-02-19 NOTE — Progress Notes (Signed)
GYNECOLOGIC ONCOLOGY NEW PATIENT CONSULTATION   Patient Name: Elizabeth Bartlett  Patient Age: 61 y.o. Date of Service: 02/19/22 Referring Provider: Dr. 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   I discussed with the patient and her husband difficulty in determining whether this is truly endometrial in origin (possibility that it arose from some small portion of lower uterine segment that was left at the time of the patient's hysterectomy) versus endocervical adenocarcinoma.  Pathology describes tissue was endometrial adenocarcinoma. We will plan to have the slides sent here for review at our tumor board.  I performed a pap test today. The patient did not have cervical caner screening for 8 years as she was under the impression that she'd had a total hysterectomy. I am concerned about involvement of the distal cervix given brisk bleeding upon insertion of the cytobrush no more than 1 cm into the endocervix for HPV testing. Additionally, her cervix is flush and firm on exam today.   I discussed getting a PET scan to help evaluate the extent of disease locally.  No evidence of metastatic disease on CT scan but PET will help to confirm this.  We reviewed some of the difficulty with supracervical hysterectomy from a surgical perspective and complications.    I will plan to present the patient's case at our next tumor board. Depending on results of additional testing (pap, pathology review, and PET scan) as well as our discussion at tumor board, I will call to let the patient know my recommendations for treatment plan.  We discussed that this may be proceeding with surgery (which would include removal of her cervical stump and lymph node dissection) versus planning to move forward with definitive radiation.  Given significant family history  (as well as her own personal history), I placed referral to genetics today.   A copy of this note was sent to the patient's referring provider.   65 minutes of total time was spent for this patient encounter, including preparation, face-to-face counseling with the patient and coordination of care, and documentation of the encounter.  Jeral Pinch, MD  Division of Gynecologic Oncology  Department of Obstetrics and Gynecology  [facility]  ___________________________________________  Chief Complaint: No chief complaint on file.   History of Present Illness:  Elizabeth Bartlett is a 61 y.o. y.o. female who is seen in consultation at the request of Dr. Adah Perl for an evaluation of endometrioid adenocarcinoma in the setting of prior supracervical hysterectomy.  Patient underwent a supracervical hysterectomy in 2014 for abnormal uterine bleeding and fibroids.  Uterus was ultimately morcellated for removal.  Fallopian tubes were also removed.  Patient has been under the impression that her cervix was removed until very recently.  In December 2022, she began having postmenopausal bleeding.  This was initially dark blood spotting for several days.  She did a vaginal cleansing but was still seeing some blood when she wiped after urinating.  Since then, she has had intermittent bright red bleeding.  She has worn a panty liner and notes a small amount on the pad when she changes it.  Bleeding is not daily, and she will sometimes go multiple days with no blood.  She has occasional left-sided pelvic cramping, otherwise denies cramping or pain.  In mid to late January, her husband encouraged her to see her primary care doctor.  She was then  referred to OB/GYN by her primary care provider.  She was seen initially on 1/31.  Pelvic ultrasound exam was performed on 2/10 showing 18 x 18 x 16 cm masslike area within the cervix containing internal blood flow, question cervical fibroids or  mass/neoplasm.  Endocervical biopsy was performed on 2/20 showing FIGO grade 2 endometrioid adenocarcinoma.  She then underwent abdominal and pelvic CT scan on 2/23 showing a prominent soft tissue mass measuring 4.2 x 3 cm just posterior and inferior to surgical clips within the pelvis.  This is noted to be either the patient's cervix if she has had a supracervical hysterectomy versus a vaginal cuff mass.  It is noted to be unchanged in size since the study from a year previously in 2022, favoring that findings represent a benign process.  Both ovaries seen and appear normal.  No adenopathy or other significant intra-abdominal findings.  Patient reports a good appetite without nausea or emesis.  She endorses normal bowel and bladder function.  She has a history of kidney stones and underwent CT scan in 01/2021, which gives the reference CT scan discussed above.  In terms of her GYN history, she believes that her cervix has been removed, she denies any Pap smear since prior to 2014.  Her breast cancer history is notable for ductal carcinoma in situ, low-grade, noted on right breast biopsy with negative margin.  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  She was then placed on antihormonal therapy (anastrozole).  From what she remembers, the discussion with her initial medical oncologist was a plan for 10 years of treatment.  Ultimately, the medical oncologist that she saw later in her treatment course recommended only 5 years.  She completed anastrozole in 03/2020.  PAST MEDICAL HISTORY:  Past Medical History:  Diagnosis Date   Anxiety    BMI 31.0-31.9,adult    Breast cancer ([place])    Depression    High cholesterol    Hypothyroidism      PAST SURGICAL HISTORY:  Past Surgical History:  Procedure Laterality Date   BILATERAL SALPINGECTOMY Bilateral 01/31/2013   Procedure: BILATERAL SALPINGECTOMY;  Surgeon: Jonnie Kind, MD;  Location: [facility];  Service: Gynecology;  Laterality: Bilateral;   CESAREAN SECTION  02/19/1987   CHOLECYSTECTOMY  05/22/1987   open   LAPAROSCOPIC SUPRACERVICAL HYSTERECTOMY N/A 01/31/2013   Procedure: LAPAROSCOPIC SUPRACERVICAL HYSTERECTOMY;  Surgeon: Jonnie Kind, MD;  Location: [facility];  Service: Gynecology;  Laterality: N/A;   lumpectomy Bilateral    With lymph node dissection on the left    OB/GYN HISTORY:  OB History  Gravida Para Term Preterm AB Living  2 2          SAB IAB Ectopic Multiple Live Births               # Outcome Date GA Lbr Len/2nd Weight Sex Delivery Anes PTL Lv  2 Para           1 Para             Patient's last menstrual period was 01/27/2013.  Age at menarche: 66 Age at menopause: 20 Hx of HRT: Denies Hx of STDs: denies Last pap: Unsure, but prior to 2014 per her report History of abnormal pap smears: no  SCREENING STUDIES:  Last mammogram: 01/2021  Last colonoscopy: 2020 Last bone mineral density: 2020  MEDICATIONS: Outpatient Encounter Medications as of 02/19/2022  Medication Sig  acetaminophen (TYLENOL) 500 MG tablet Take 1,000 mg by mouth every 6 (six) hours as needed.   atorvastatin (LIPITOR) 10 MG tablet Take 5 mg by mouth daily.   buPROPion (WELLBUTRIN SR) 150 MG 12 hr tablet Take 150 mg by mouth 2 (two) times daily.   FLUoxetine (PROZAC) 40 MG capsule Take 40 mg by mouth daily.   fluticasone (FLONASE) 50 MCG/ACT nasal spray Place 2 sprays into the nose daily as needed. Sinus Pressure   levothyroxine (SYNTHROID, LEVOTHROID) 25 MCG tablet Take 25 mcg by mouth daily.   LORazepam (ATIVAN) 0.5 MG tablet Take 0.5 mg by mouth 2 (two) times daily.   meclizine (ANTIVERT) 25 MG tablet Take 25 mg by mouth 2 (two) times daily as needed for dizziness.   meloxicam (MOBIC) 15 MG tablet Take 15 mg by mouth daily.   anastrozole (ARIMIDEX) 1 MG tablet Take 5 mg by mouth daily. (Patient not taking: Reported on 02/19/2022)    HYDROcodone-acetaminophen (VICODIN) 5-500 MG per tablet Take 1 tablet by mouth every 4 (four) hours as needed for pain. (Patient not taking: Reported on 02/18/2022)   [DISCONTINUED] ketorolac (TORADOL) 10 MG tablet Take 1 tablet (10 mg total) by mouth every 6 (six) hours as needed for pain.   No facility-administered encounter medications on file as of 02/19/2022.    ALLERGIES:  No Known Allergies   FAMILY HISTORY:  Family History  Problem Relation Age of Onset   COPD Mother    Cancer Father        lung   Colon cancer Father    Breast cancer Paternal Aunt    Cancer Maternal Grandmother    Cirrhosis Maternal Grandmother        non-alcoholic cirrhosis   Heart attack Maternal Grandfather    Diabetes Maternal Grandfather    Colon cancer Paternal Grandfather    Cancer Paternal Grandfather        colon   Lymphoma Cousin    Breast cancer Other    Ovarian cancer Neg Hx    Endometrial cancer Neg Hx    Pancreatic cancer Neg Hx    Prostate cancer Neg Hx      SOCIAL HISTORY:  Social Connections: Not on file    REVIEW OF SYSTEMS:  + Vaginal bleeding Denies appetite changes, fevers, chills, fatigue, unexplained weight changes. Denies hearing loss, neck lumps or masses, mouth sores, ringing in ears or voice changes. Denies cough or wheezing.  Denies shortness of breath. Denies chest pain or palpitations. Denies leg swelling. Denies abdominal distention, pain, blood in stools, constipation, diarrhea, nausea, vomiting, or early satiety. Denies pain with intercourse, dysuria, frequency, hematuria or incontinence. Denies hot flashes, pelvic pain or vaginal discharge.   Denies joint pain, back pain or muscle pain/cramps. Denies itching, rash, or wounds. Denies dizziness, headaches, numbness or seizures. Denies swollen lymph nodes or glands, denies easy bruising or bleeding. Denies anxiety, depression, confusion, or decreased concentration.  Physical Exam:  Vital Signs for this encounter:   Blood pressure (!) 154/91, pulse 85, temperature 98.2 F (36.8 C), temperature source Oral, resp. rate 18, height 5' 0.04" (1.525 m), weight 163 lb (73.9 kg), last menstrual period 01/27/2013, SpO2 100 %. Body mass index is 31.79 kg/m. General: Alert, oriented, no acute distress.  HEENT: Normocephalic, atraumatic. Sclera anicteric.  Chest: Clear to auscultation bilaterally. No wheezes, rhonchi, or rales. Cardiovascular: Regular rate and rhythm, no murmurs, rubs, or gallops.  Abdomen: Normoactive bowel sounds. Soft, nondistended, nontender to palpation. No masses or hepatosplenomegaly appreciated.  No palpable fluid wave.  Extremities: Grossly normal range of motion. Warm, well perfused. No edema bilaterally.  Skin: No rashes or lesions.  Lymphatics: No cervical, supraclavicular, or inguinal adenopathy.  GU:  Normal external female genitalia.  No lesions. No discharge or bleeding.             Bladder/urethra:  No lesions or masses, well supported bladder             Vagina: Mildly atrophic, no lesions or masses.             Cervix: Cervix is small and overall normal in appearance, somewhat atrophic.  Flush with the vagina along the right aspect at 9:00.  Pap smear performed and Cytobrush used only within the distal half centimeter of the endocervix.  This caused brisk bleeding that required silver nitrate as well as pressure to achieve hemostasis.  Colposcopy was then deferred.  On bimanual exam, cervix was somewhat nodular although small.  No definitive parametrial involvement or nodularity appreciated.             Uterus: Surgically absent.             Adnexa: No masses appreciated.  Rectal: Confirms the above exam findings.  LABORATORY AND RADIOLOGIC DATA:  Outside medical records were reviewed to synthesize the above history, along with the history and physical obtained during the visit.   Lab Results  Component Value Date   WBC 11.3 (H) 02/01/2013   HGB 10.1 (L) 02/01/2013   HCT 31.3  (L) 02/01/2013   PLT 308 02/01/2013   GLUCOSE 103 (H) 01/27/2013   NA 139 01/27/2013   K 3.7 01/27/2013   CL 102 01/27/2013   CREATININE 0.99 01/27/2013   BUN 15 01/27/2013   CO2 27 01/27/2013   CT A/P on 02/12/22: 1. Stable 4.2 x 3.0 cm soft tissue mass posterior and inferior to the surgical clips in the pelvis. This could be the patient's cervix if they had a supracervical hysterectomy. Could not exclude a vaginal cuff mass but given there is no change since the study from a year ago this is unlikely. Recommend correlation with vaginal speculum examination. 2. Both ovaries are still present and appear normal. 3. No findings for metastatic disease involving the abdomen/pelvis. 4. Status post cholecystectomy. No biliary dilatation.  Cervical curettage/cervical biopsy on 2/10: Endometrioid adenocarcinoma, FIGO grade 2, with squamous metaplasia.  Incidental fragments of benign squamous mucosa.

## 2022-02-19 NOTE — Patient Instructions (Addendum)
It was a pleasure meeting you today. ? ?Today we discussed that it is somewhat challenging to determine if this is cancer that is started from the canal of your cervix or some small part of your uterus that was left at the time of your hysterectomy.  I would like to get a PET scan to help better look at the area of the cervix as well as to evaluate for metastatic disease. ? ?We will work to get your biopsy sent here so that we can review your biopsy results and imaging at our next tumor conference.  I will call you once I have all the information necessary to move forward with the treatment plan. ? ?Based on our review of all of your results, we will either talk about moving forward with getting surgery scheduled or treating you with radiation. ? ?I am also putting in a referral for genetic counseling.  Given your own history of breast cancer and now this cancer as well as your family history, I think that it would be worth a discussion with them about whether updated genetic testing needs to be done. ?

## 2022-02-19 NOTE — Progress Notes (Signed)
Faxed slide request to [facility]. ?

## 2022-02-20 DIAGNOSIS — Z853 Personal history of malignant neoplasm of breast: Secondary | ICD-10-CM | POA: Insufficient documentation

## 2022-02-20 DIAGNOSIS — C579 Malignant neoplasm of female genital organ, unspecified: Secondary | ICD-10-CM | POA: Insufficient documentation

## 2022-02-20 DIAGNOSIS — Z800 Family history of malignant neoplasm of digestive organs: Secondary | ICD-10-CM | POA: Insufficient documentation

## 2022-02-20 DIAGNOSIS — Z124 Encounter for screening for malignant neoplasm of cervix: Secondary | ICD-10-CM | POA: Insufficient documentation

## 2022-02-25 LAB — CYTOLOGY - PAP
Comment: NEGATIVE
High risk HPV: NEGATIVE

## 2022-03-02 ENCOUNTER — Telehealth: Payer: Self-pay | Admitting: Oncology

## 2022-03-02 ENCOUNTER — Other Ambulatory Visit: Payer: Self-pay | Admitting: Gynecologic Oncology

## 2022-03-02 ENCOUNTER — Other Ambulatory Visit: Payer: Self-pay | Admitting: Oncology

## 2022-03-02 DIAGNOSIS — C579 Malignant neoplasm of female genital organ, unspecified: Secondary | ICD-10-CM

## 2022-03-02 NOTE — Progress Notes (Signed)
Gynecologic Oncology Multi-Disciplinary Disposition Conference Note ? ?Date of the Conference: 03/02/2022 ? ?Patient Name: Elizabeth Bartlett  ?Referring Provider: Dr. Adah Perl ?Primary GYN Oncologist: Dr. Berline Lopes ?Radiation Oncologist: Dr. Sondra Come ? ?Stage/Disposition:  Grade 2 endometrioid adenocarcinoma. Disposition is for a MRI of the pelvis after PET imaging followed by definitive radiation therapy.  ? ?This Multidisciplinary conference took place involving physicians from [place], Medical Oncology, Radiation Oncology, Pathology, Radiology along with the Gynecologic Oncology Nurse Practitioner and Gynecologic Oncology Nurse Navigator.  Comprehensive assessment of the patient's malignancy, staging, need for surgery, chemotherapy, radiation therapy, and need for further testing were reviewed. Supportive measures, both inpatient and following discharge were also discussed. The recommended plan of care is documented. Greater than 35 minutes were spent correlating and coordinating this patient's care.  ?

## 2022-03-02 NOTE — Telephone Encounter (Signed)
Called Elizabeth Bartlett and advised her of the [facility] cancer conference recommendations for a Pelvic MRI.  Went over appointment time and details for 03/13/22.  Also advised her that Dr. Berline Lopes will call her after her PET scan to advise her if the MRI can be canceled.  She verbalized understanding and agreement. ?

## 2022-03-09 ENCOUNTER — Other Ambulatory Visit: Payer: Self-pay

## 2022-03-09 ENCOUNTER — Ambulatory Visit (HOSPITAL_COMMUNITY)
Admission: RE | Admit: 2022-03-09 | Discharge: 2022-03-09 | Disposition: A | Discharge status: Home / Self Care | Payer: BC Managed Care – PPO | Source: Ambulatory Visit | Attending: Gynecologic Oncology | Admitting: Gynecologic Oncology

## 2022-03-09 DIAGNOSIS — C579 Malignant neoplasm of female genital organ, unspecified: Secondary | ICD-10-CM | POA: Insufficient documentation

## 2022-03-09 LAB — GLUCOSE, CAPILLARY: Glucose-Capillary: 110 mg/dL — ABNORMAL HIGH (ref 70–99)

## 2022-03-09 IMAGING — CT NM PET TUM IMG INITIAL (PI) SKULL BASE T - THIGH
1 of 7 series · 1 of 25 positions shown · non-contrast
Comparison: None.

CLINICAL DATA: Initial treatment strategy for uterine/cervical
carcinoma.

EXAM:
NUCLEAR MEDICINE PET SKULL BASE TO THIGH
TECHNIQUE: 7.96 mCi F-18 FDG was injected intravenously. Full-ring PET imaging
was performed from the skull base to thigh after the radiotracer. CT
data was obtained and used for attenuation correction and anatomic
localization.
Fasting blood glucose: 110 mg/dl

[Series 4: ct sk_thigh 5.0 bf37 · axial · 5.0mm · 0.98mm/px · 1 of 207 slices shown]
[im 207/207  brain]
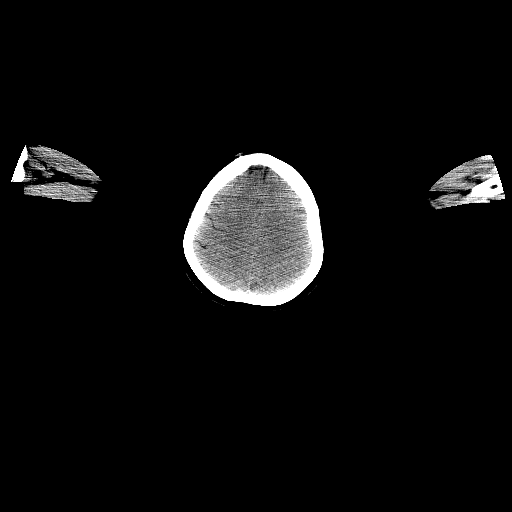

[1 of 25 positions shown; findings below may reference images not displayed]

FINDINGS: Mediastinal blood pool activity: SUV max

Liver activity: SUV max NA

NECK: No hypermetabolic lymph nodes in the neck.

Incidental CT findings: none

CHEST: No hypermetabolic mediastinal or hilar nodes. No suspicious
pulmonary nodules on the CT scan.

Incidental CT findings: none

ABDOMEN/PELVIS: Intense activity within the uterine body with SUV
max equal 15.8. No evidence of extension beyond the myometrium. No
pelvic sidewall lesions. No hypermetabolic lymph nodes in the
pelvis. No periaortic retroperitoneal enlarged or metabolic lymph
nodes.

No abnormal activity liver.

Incidental CT findings: Postcholecystectomy.  Post oophorectomy.

SKELETON: No focal hypermetabolic activity to suggest skeletal
metastasis.

Incidental CT findings: none
IMPRESSION: Hypermetabolic activity in uterine body consistent with primary
uterine carcinoma

No evidence metastatic disease outside of the uterus.

## 2022-03-09 MED ORDER — FLUDEOXYGLUCOSE F - 18 (FDG) INJECTION
7.9600 | Freq: Once | INTRAVENOUS | Status: AC | PRN
  Administered 2022-03-09: 7.96 via INTRAVENOUS

## 2022-03-11 ENCOUNTER — Telehealth: Payer: Self-pay | Admitting: Gynecologic Oncology

## 2022-03-11 NOTE — Telephone Encounter (Signed)
Called patient. Discussed PET findings (no metastatic disease). Will await MRI to aid in decision of surgery vs primary RT.  ? ?Jeral Pinch MD ?Gynecologic Oncology ? ?

## 2022-03-13 ENCOUNTER — Other Ambulatory Visit: Payer: Self-pay

## 2022-03-13 ENCOUNTER — Ambulatory Visit (HOSPITAL_COMMUNITY)
Admission: RE | Admit: 2022-03-13 | Discharge: 2022-03-13 | Disposition: A | Discharge status: Home / Self Care | Payer: BC Managed Care – PPO | Source: Ambulatory Visit | Attending: Gynecologic Oncology | Admitting: Gynecologic Oncology

## 2022-03-13 DIAGNOSIS — C579 Malignant neoplasm of female genital organ, unspecified: Secondary | ICD-10-CM | POA: Insufficient documentation

## 2022-03-13 IMAGING — MR MR PELVIS WO/W CM
9 of 23 series · 19 of 48 positions shown · IV contrast (gadavist)
Comparison: PET-CT [DATE].

CLINICAL DATA: Status post supracervical hysterectomy with
endometrioid adenocarcinoma of the cervix. Staging.

EXAM:
MRI PELVIS WITHOUT AND WITH CONTRAST
TECHNIQUE: Multiplanar multisequence MR imaging of the pelvis was performed
both before and after administration of intravenous contrast.
CONTRAST:  7mL GADAVIST GADOBUTROL 1 MMOL/ML IV SOLN

[Series 2: T2 · coronal · 5.0mm · 0.78mm/px · 2 of 36 slices shown (1 of 3)]
[im 1/36]
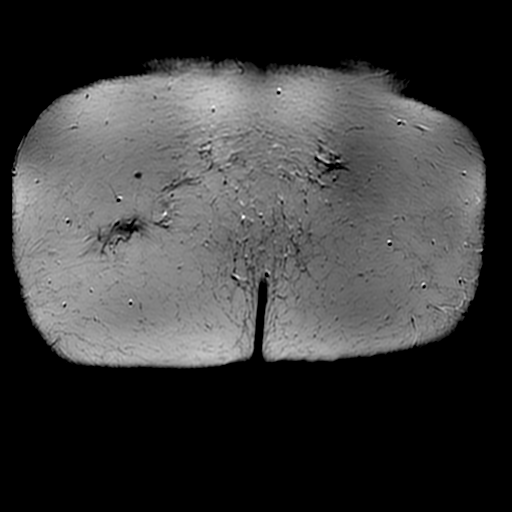
[im 36/36]
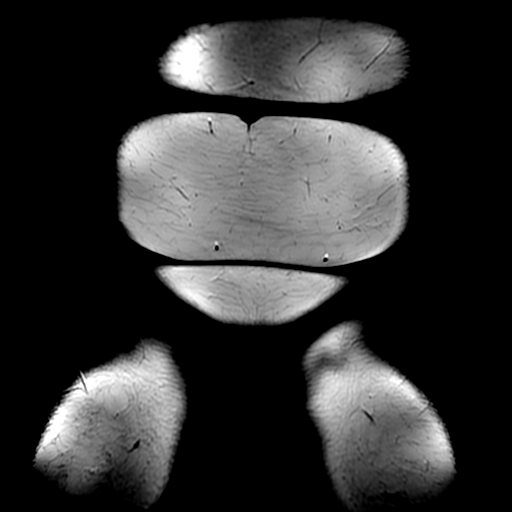

[Series 3: T2 · sagittal · 3.0mm · 0.51mm/px · 2 of 32 slices shown (2 of 3)]
[im 1/32]
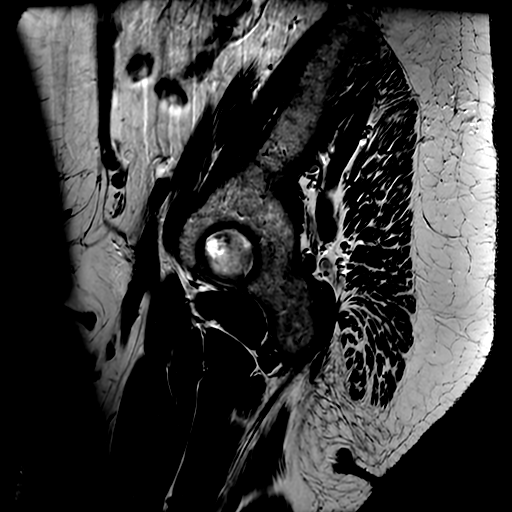
[im 32/32]
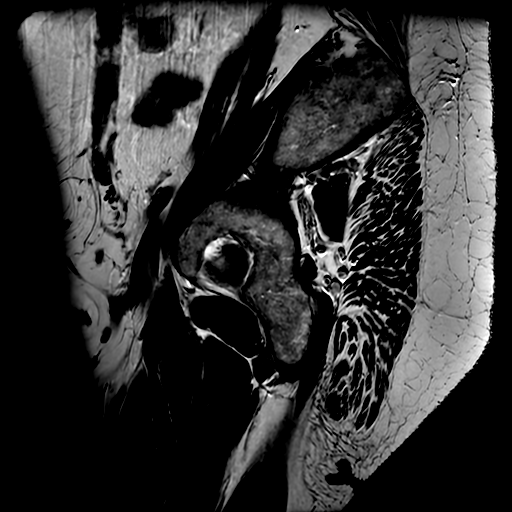

[Series 4: T2 fat-sat · axial · 3.0mm · 0.51mm/px · z∈[-58,+142]mm · 3 of 51 slices shown]
[im 1/51]
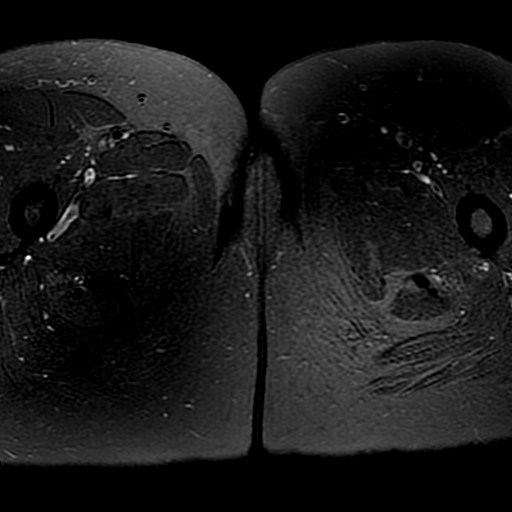
[im 26/51]
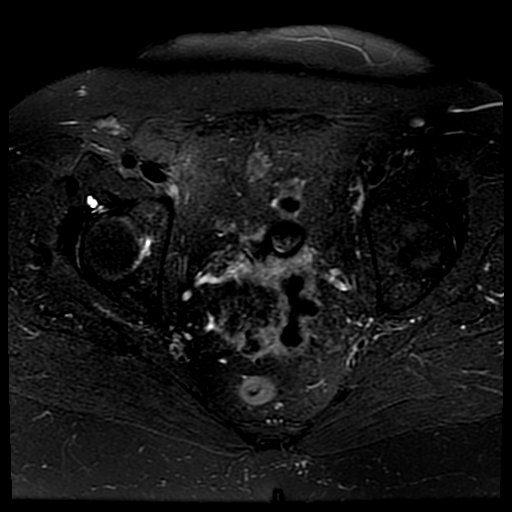
[im 51/51]
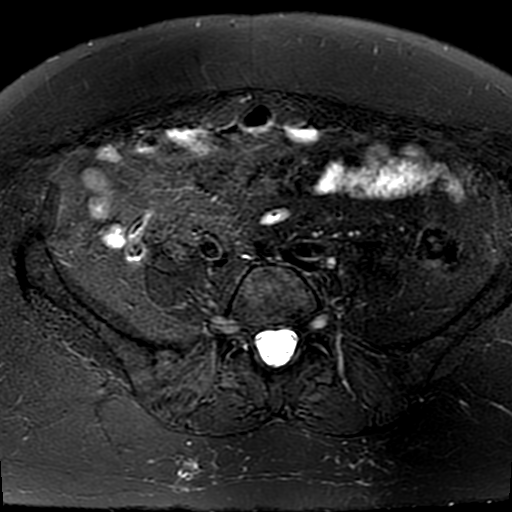

[Series 5: T2 · oblique · 3.0mm · 0.47mm/px · 2 of 37 slices shown (3 of 3)]
[im 1/37]
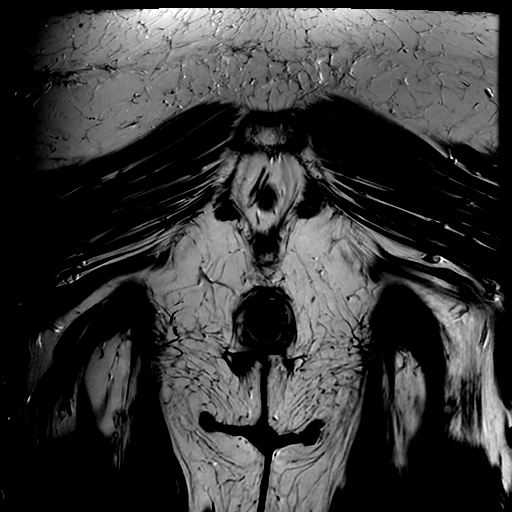
[im 37/37]
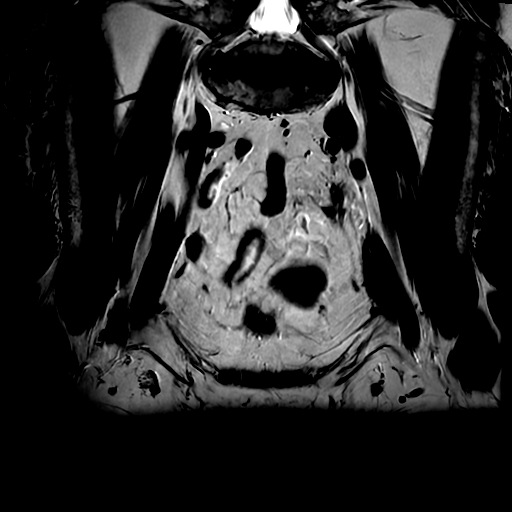

[Series 7: DWI · axial · 3.0mm · 1.09mm/px · z∈[-23,+52]mm · 2 of 45 slices shown]
[im 1/45]
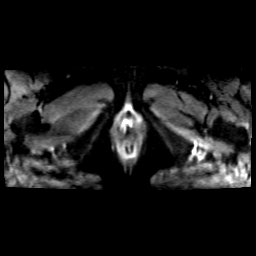
[im 45/45]
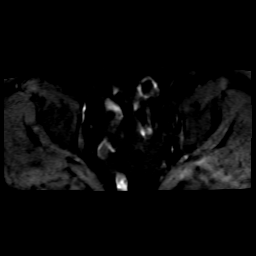

[Series 8: T1 dynamic · axial · 9.8mm · 0.51mm/px · z∈[-144,+271]mm · 3 of 84 slices shown (1 of 4)]
[im 1/84]
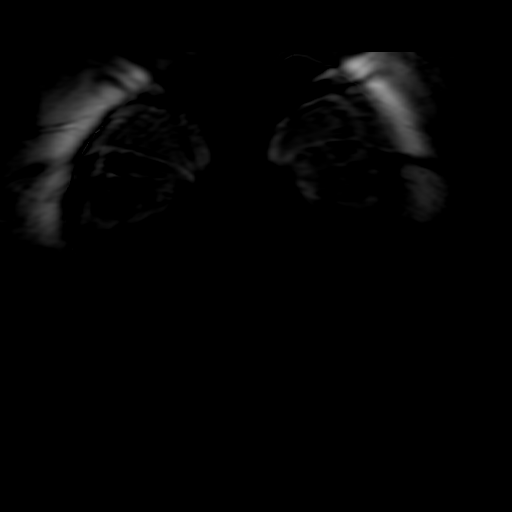
[im 42/84]
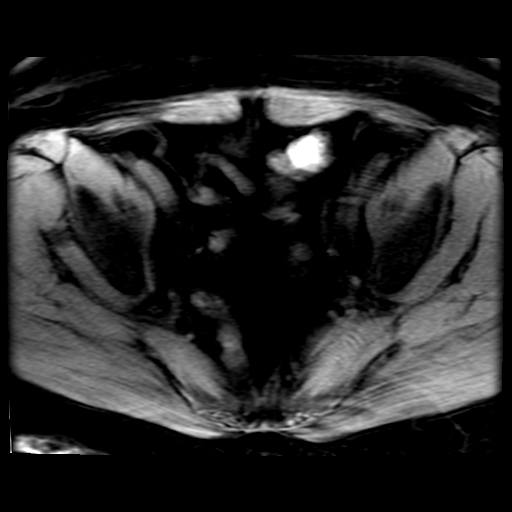
[im 84/84]
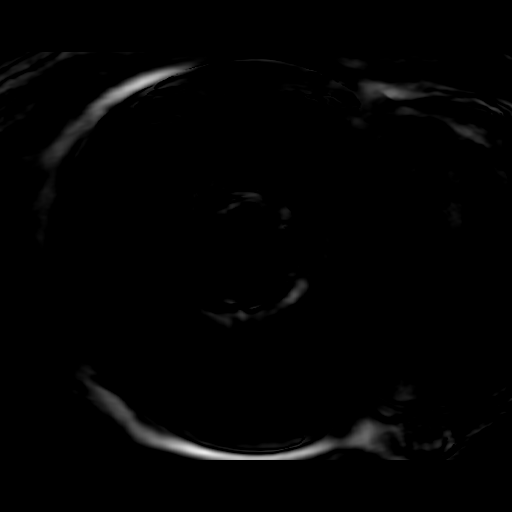

[Series 12: T1 dynamic · sagittal · 5.0mm · 0.47mm/px · 2 of 48 slices shown (2 of 4)]
[im 1/48]
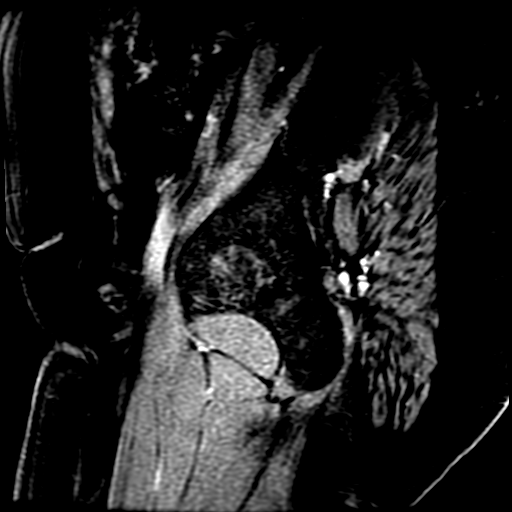
[im 48/48]
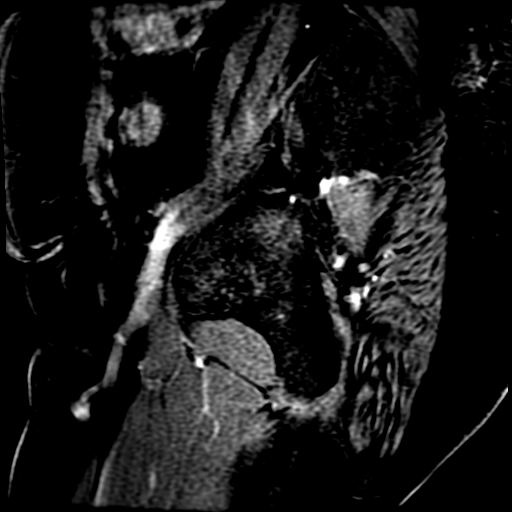

[Series 13: T1 dynamic · axial · 5.0mm · 0.51mm/px · z∈[-18,+89]mm · 2 of 44 slices shown (3 of 4)]
[im 1/44]
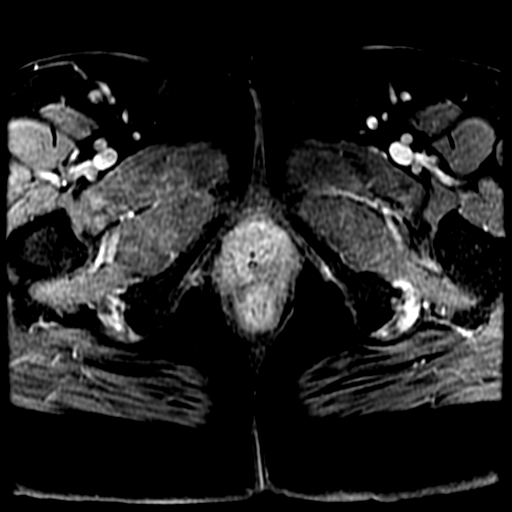
[im 44/44]
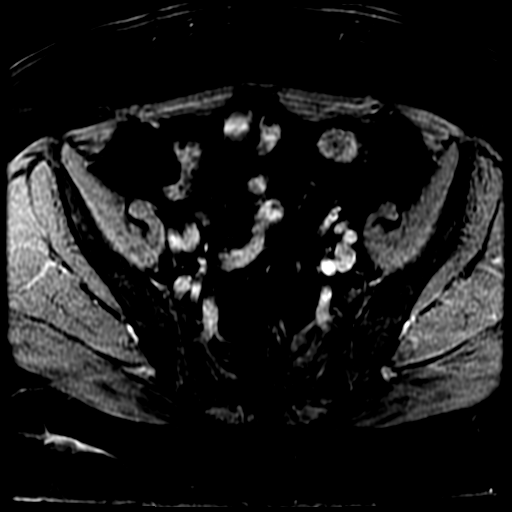

[Series 14: T1 dynamic · coronal · 5.0mm · 0.62mm/px · 1 of 68 slices shown (4 of 4)]
[im 1/68]
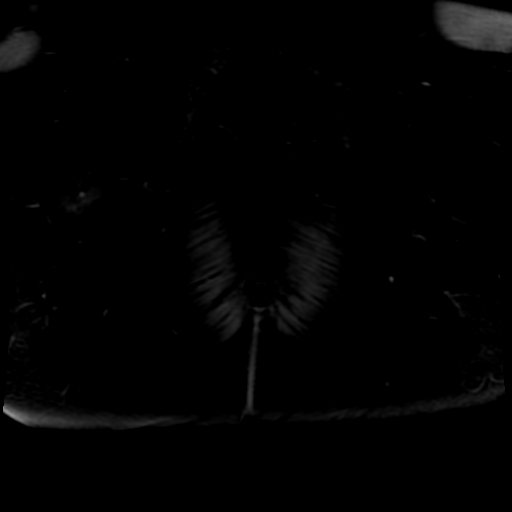

[19 of 48 positions shown; findings below may reference images not displayed]

FINDINGS: Urinary Tract:  Bladder is decompressed.

Bowel:  Unremarkable visualized pelvic bowel loops.

Vascular/Lymphatic: No pathologically enlarged lymph nodes. No
significant vascular abnormality seen.

Reproductive: Status post supracervical hysterectomy. No adnexal
mass.

3.6 x 2.8 x 2.9 cm cervical mass is identified in the cervix. This
replaces and peripherally compresses the normal low signal intensity
cervical stroma. Fat plane between the rectum and the cervix is well
preserved without evidence of rectal involvement. There is no
posterior bladder wall thickening and fat plane between the
posterior bladder wall and cervix is preserved in some areas.
Assessment of the upper and posterior bladder wall and cervix is
limited by surgical scarring and susceptibility artifact from an
adjacent cervical clip. No abnormal bladder wall enhancement or
focal bladder wall thickening evident. Compressed cervical stroma
posterolaterally on the right is markedly thin with abnormal signal
intensity extending out to the margin although no gross parametrial
extension can be identified. Abnormal signal/tumor in the posterior
service extends down to the external os, but no associated vaginal
wall thickening or involvement is appreciated.

Other:  No intraperitoneal free fluid.

Musculoskeletal: No focal suspicious marrow enhancement within the
visualized bony anatomy.
IMPRESSION: 3.6 x 2.8 x 2.9 cm cervical mass replaces and peripherally
compresses normal cervical stroma stroma. There is marked thinning
of the normal stroma posterolaterally on the right without definite
parametrial extension on today's study. No involvement of the
rectum. Assessment of the upper posterior bladder wall is limited by
surgical scarring and susceptibility artifact from adjacent surgical
clips in this region. Within this limitation, no focal bladder wall
thickening or abnormal bladder wall enhancement is appreciated.
Tumor extends posteriorly to the level of the external os, but no
definite vaginal wall involvement appreciable by MRI.

No evidence for pelvic sidewall lymphadenopathy.

## 2022-03-13 MED ORDER — GADOBUTROL 1 MMOL/ML IV SOLN
7.0000 mL | Freq: Once | INTRAVENOUS | Status: AC | PRN
  Administered 2022-03-13: 7 mL via INTRAVENOUS

## 2022-03-17 ENCOUNTER — Telehealth: Payer: Self-pay | Admitting: *Deleted

## 2022-03-17 ENCOUNTER — Other Ambulatory Visit: Payer: Self-pay | Admitting: Licensed Clinical Social Worker

## 2022-03-17 DIAGNOSIS — C579 Malignant neoplasm of female genital organ, unspecified: Secondary | ICD-10-CM

## 2022-03-17 DIAGNOSIS — Z800 Family history of malignant neoplasm of digestive organs: Secondary | ICD-10-CM

## 2022-03-17 NOTE — Telephone Encounter (Signed)
Spoke with pt this afternoon regarding her MRI results. She was wondering if Dr.Tucker had the results and the recommendations for plan of care from the results of the MRI. Informed pt we would reach out to Dr.Tucker. Pt verbalized agreement and did not voice any other concerns.  ?

## 2022-03-18 NOTE — Telephone Encounter (Signed)
Spoke with Ms. Bango this morning and advised patient that Dr. Berline Lopes just got back from a conference and is in the [place] today. She will review the images with radiology today and call the patient after she finishes in the OR today. Patient verbalized understanding.  ?

## 2022-03-18 NOTE — Progress Notes (Signed)
I called patient on both her cell phone and home phone. Her voicemail on cell phone was full and her home phone voicemail was not set up. I will try her again tomorrow to discuss imaging results and treatment options. ? ?Valarie Cones MD

## 2022-03-19 ENCOUNTER — Telehealth: Payer: Self-pay | Admitting: Gynecologic Oncology

## 2022-03-19 NOTE — Telephone Encounter (Signed)
I had a long conversation with the patient and her husband about MRI and PET findings.  No evidence of metastatic disease on the PET.  MRI shows cervical involvement although no definitive parametrial or vaginal involvement.  We discussed that in the setting of someone who has not had a supracervical hysterectomy, this is stage II uterine cancer and could be considered for primary radical hysterectomy versus EBRT+/- brachytherapy with consideration for interval hysterectomy.  I am concerned that the sigmoid colon may be adherent to the uterine stump and that there is some loss of the plane between the bladder and the cervix.  I think that this will be quite challenging and runs the risk of requiring bowel resection and causing bladder injury.  If either of these happened, it may delay her being able to start adjuvant treatment.  I offered that primary surgery could be performed but that my recommendation would be that we start with pelvic radiation +/- concurrent sensitizing cisplatin.  Plan would then be for imaging and an exam at the time of her first HDR treatment.  If she has had an excellent response to treatment thus far, it may be feasible to modify her total treatment dose and plan for interval trachelectomy.  Otherwise, we would continue with completion of primary radiation.  All of patient's questions were answered.  She was amenable with this plan.  I will reach out to our nurse navigator to help get the patient scheduled with both medical and radiation oncology. ? ?Jeral Pinch MD ?Gynecologic Oncology ? ?

## 2022-03-20 ENCOUNTER — Telehealth: Payer: Self-pay | Admitting: Oncology

## 2022-03-20 ENCOUNTER — Encounter: Payer: Self-pay | Admitting: Radiation Oncology

## 2022-03-20 ENCOUNTER — Ambulatory Visit
Admission: RE | Admit: 2022-03-20 | Discharge: 2022-03-20 | Disposition: A | Discharge status: Home / Self Care | Payer: BC Managed Care – PPO | Source: Ambulatory Visit | Attending: Radiation Oncology | Admitting: Radiation Oncology

## 2022-03-20 ENCOUNTER — Other Ambulatory Visit: Payer: Self-pay

## 2022-03-20 VITALS — BP 158/81 | HR 83 | Temp 97.8°F | Resp 20 | Ht 60.0 in | Wt 169.0 lb

## 2022-03-20 DIAGNOSIS — E7800 Pure hypercholesterolemia, unspecified: Secondary | ICD-10-CM | POA: Diagnosis not present

## 2022-03-20 DIAGNOSIS — C579 Malignant neoplasm of female genital organ, unspecified: Secondary | ICD-10-CM

## 2022-03-20 DIAGNOSIS — E039 Hypothyroidism, unspecified: Secondary | ICD-10-CM | POA: Diagnosis not present

## 2022-03-20 DIAGNOSIS — Z90722 Acquired absence of ovaries, bilateral: Secondary | ICD-10-CM | POA: Diagnosis not present

## 2022-03-20 DIAGNOSIS — Z79899 Other long term (current) drug therapy: Secondary | ICD-10-CM | POA: Insufficient documentation

## 2022-03-20 DIAGNOSIS — C55 Malignant neoplasm of uterus, part unspecified: Secondary | ICD-10-CM

## 2022-03-20 DIAGNOSIS — Z853 Personal history of malignant neoplasm of breast: Secondary | ICD-10-CM | POA: Insufficient documentation

## 2022-03-20 DIAGNOSIS — Z923 Personal history of irradiation: Secondary | ICD-10-CM | POA: Diagnosis not present

## 2022-03-20 DIAGNOSIS — Z801 Family history of malignant neoplasm of trachea, bronchus and lung: Secondary | ICD-10-CM | POA: Diagnosis not present

## 2022-03-20 DIAGNOSIS — C541 Malignant neoplasm of endometrium: Secondary | ICD-10-CM | POA: Diagnosis present

## 2022-03-20 DIAGNOSIS — Z803 Family history of malignant neoplasm of breast: Secondary | ICD-10-CM | POA: Diagnosis not present

## 2022-03-20 DIAGNOSIS — Z800 Family history of malignant neoplasm of digestive organs: Secondary | ICD-10-CM | POA: Insufficient documentation

## 2022-03-20 DIAGNOSIS — Z90710 Acquired absence of both cervix and uterus: Secondary | ICD-10-CM | POA: Insufficient documentation

## 2022-03-20 DIAGNOSIS — Z791 Long term (current) use of non-steroidal anti-inflammatories (NSAID): Secondary | ICD-10-CM | POA: Diagnosis not present

## 2022-03-20 NOTE — Progress Notes (Signed)
?Radiation Oncology         [phone] ?________________________________ ? ?Initial Outpatient Consultation ? ?Name: TROY HARTZOG MRN: [id number]  ?Date: 03/20/2022  DOB: 1961-07-18 ? ?PF:[id number], Silvestre Moment, MD  Lafonda Mosses, MD  ? ?REFERRING PHYSICIAN: Lafonda Mosses, MD ? ?DIAGNOSIS: The primary encounter diagnosis was Malignant neoplasm of uterus, unspecified site [facility] [place]). A diagnosis of Gynecologic malignancy [facility] was also pertinent to this visit. ? 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  (Likely originating in the residual lower uterine segment, after supracervical hysterectomy, with infiltration of the cervical region) ? ?HISTORY OF PRESENT ILLNESS::Mady L Vest is a 61 y.o. female who is accompanied by her husband. she is seen as a courtesy of Dr. Berline Lopes for an opinion concerning radiation therapy as part of management for her recently diagnosed endometrial cancer.  ? ?The patient presented to Dr. Adah Perl [facility]) on 01/20/22 with complaints of intermittent postmenopausal bleeding. The patient was also noted to report vaginal bleeding prior to christmas time, and noted that she had a hysterectomy years ago (detailed below). Physical exam performed during this visit revealed blood from the OS. Pap performed during this visit revealed atypical glandular cells.  ? ?Subsequent transvaginal US performed for further evaluation on 01/30/22 revealed a 18 x 18 x 16 mm diameter masslike area within the cervix containing internal blood flow, concerning for cervical fibroid vs cervical mass/neoplasm. ? ?Endocervical biopsies collected on 02/09/22 showed FIGO grade 2 endometrioid adenocarcinoma ? ?CT of the abdomen and pelvis with contrast on 02/10/22 showed the known soft tissue mass as stable in the interval, measuring 4.3 x 3.0 cm, and located just posterior and inferior to surgical clips within the pelvis. No findings of metastatic disease involving the  abdomen or pelvis were otherwise appreciated. (The identified mass was noted to be either the patient's cervix if she has had a supracervical hysterectomy versus a vaginal cuff mass). ? ?Given biopsy findings, the patient was accordingly referred to Dr. Berline Lopes on 02/19/22 for further management. During this visit, the patient denied any symptoms other than intermittent vaginal bleeding. Ultimately, Dr. Berline Lopes recommended further work-up via Pap and PET scan, followed by review of her case at the tumor board, prior to considering supracervical hysterectomy. Pap collected during this visit also revealed atypical grandular cells.  ? 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 ? ?Of note: Patient underwent a supracervical hysterectomy in 2014 for abnormal uterine bleeding and fibroids.  Uterus was ultimately morcellated for removal.  Fallopian tubes were also removed.  Patient has been under the impression that her cervix was removed until very recently.  ? 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 Left breast also treated with XRT, and anastrozole.   ? ?PREVIOUS RADIATION THERAPY: Yes, radiation completed in August of 2016: 45 Gy to both breasts with 16 GY boost to both TB.  She is unsure who treated her with radiation therapy at the [place] center before [facility] took over. ? ?PAST MEDICAL HISTORY:  ?Past Medical History:  ?Diagnosis Date  ? Anxiety   ? BMI 31.0-31.9,adult   ? Breast cancer ([place])   ? Depression   ? High cholesterol   ? Hypothyroidism   ? ? ?PAST SURGICAL HISTORY: ?Past Surgical History:  ?Procedure Laterality Date  ? BILATERAL SALPINGECTOMY Bilateral 01/31/2013  ? Procedure: BILATERAL  SALPINGECTOMY;  Surgeon: Jonnie Kind, MD;  Location: [facility];  Service: Gynecology;  Laterality: Bilateral;  ?  CESAREAN SECTION  02/19/1987  ? CHOLECYSTECTOMY  05/22/1987  ? open  ? LAPAROSCOPIC SUPRACERVICAL HYSTERECTOMY N/A 01/31/2013  ? Procedure: LAPAROSCOPIC SUPRACERVICAL HYSTERECTOMY;  Surgeon: Jonnie Kind, MD;  Location: [facility];  Service: Gynecology;  Laterality: N/A;  ? lumpectomy Bilateral   ? With lymph node dissection on the left  ? ? ?FAMILY HISTORY:  ?Family History  ?Problem Relation Age of Onset  ? COPD Mother   ? Cancer Father   ?     lung  ? Colon cancer Father   ? Breast cancer Paternal Aunt   ? Cancer Maternal Grandmother   ? Cirrhosis Maternal Grandmother   ?     non-alcoholic cirrhosis  ? Heart attack Maternal Grandfather   ? Diabetes Maternal Grandfather   ? Colon cancer Paternal Grandfather   ? Cancer Paternal Grandfather   ?     colon  ? Lymphoma Cousin   ? Breast cancer Other   ? Ovarian cancer Neg Hx   ? Endometrial cancer Neg Hx   ? Pancreatic cancer Neg Hx   ? Prostate cancer Neg Hx   ? ? ?SOCIAL HISTORY:  ?Social History  ? ?Tobacco Use  ? Smoking status: Never  ?Vaping Use  ? Vaping Use: Never used  ?Substance Use Topics  ? Alcohol use: No  ? Drug use: No  ? ? ?ALLERGIES: No Known Allergies ? ?MEDICATIONS:  ?Current Outpatient Medications  ?Medication Sig Dispense Refill  ? acetaminophen (TYLENOL) 500 MG tablet Take 1,000 mg by mouth every 6 (six) hours as needed.    ? atorvastatin (LIPITOR) 10 MG tablet Take 5 mg by mouth daily.    ? buPROPion (WELLBUTRIN SR) 150 MG 12 hr tablet Take 150 mg by mouth 2 (two) times daily.    ? FLUoxetine (PROZAC) 40 MG capsule Take 40 mg by mouth daily.    ? fluticasone (FLONASE) 50 MCG/ACT nasal spray Place 2 sprays into the nose daily as needed. Sinus Pressure    ? levothyroxine (SYNTHROID, LEVOTHROID) 25 MCG tablet Take 25 mcg by mouth daily.    ? meclizine (ANTIVERT) 25 MG tablet Take 25 mg by mouth 2 (two) times daily as needed for dizziness.    ? meloxicam (MOBIC) 15 MG tablet Take 15 mg by mouth daily.    ? anastrozole (ARIMIDEX) 1 MG tablet Take 5 mg  by mouth daily. (Patient not taking: Reported on 02/19/2022)    ? HYDROcodone-acetaminophen (VICODIN) 5-500 MG per tablet Take 1 tablet by mouth every 4 (four) hours as needed for pain. (Patient not taking: Reported on 02/18/2022) 60 tablet 0  ? LORazepam (ATIVAN) 0.5 MG tablet Take 0.5 mg by mouth 2 (two) times daily. (Patient not taking: Reported on 03/20/2022)    ? ?No current facility-administered medications for this encounter.  ? ? ?REVIEW OF SYSTEMS:  A 10+ POINT REVIEW OF SYSTEMS WAS OBTAINED including neurology, dermatology, psychiatry, cardiac, respiratory, lymph, extremities, GI, GU, musculoskeletal, constitutional, reproductive, HEENT.  She continues to have some intermittent sporadic vaginal bleeding but very low volume.  She has a mild crampy type sensation within the deep pelvis.  She denies any hematuria or rectal bleeding. ?  ?PHYSICAL EXAM:  height is 5' (1.524 m) and weight is 169 lb (76.7 kg). Her temperature is 97.8 ?F (36.6 ?C). Her blood pressure is 158/81 (abnormal) and her pulse is 83. Her respiration is  20 and oxygen saturation is 97%.   ?General: Alert and oriented, in no acute distress ?HEENT: Head is normocephalic. Extraocular movements are intact. ?Neck: Neck is supple, no palpable cervical or supraclavicular lymphadenopathy. ?Heart: Regular in rate and rhythm with no murmurs, rubs, or gallops. ?Chest: Clear to auscultation bilaterally, with no rhonchi, wheezes, or rales. ?Abdomen: Soft, nontender, nondistended, with no rigidity or guarding. ?Extremities: No cyanosis or edema. ?Lymphatics: see Neck Exam ?Skin: No concerning lesions. ?Musculoskeletal: symmetric strength and muscle tone throughout. ?Neurologic: Cranial nerves II through XII are grossly intact. No obvious focalities. Speech is fluent. Coordination is intact. ?Psychiatric: Judgment and insight are intact. Affect is appropriate. ?A pelvic exam is performed.  The external genitalia are unremarkable.  A speculum exam is performed.   No blood is noted in the vaginal vault.  The cervical os is identified which shows some erythema.  On bimanual and rectovaginal examination the cervix is firm and nodular throughout consistent with probable tu

## 2022-03-20 NOTE — Progress Notes (Signed)
See MD note for nursing evaluation. °

## 2022-03-20 NOTE — Telephone Encounter (Signed)
Elizabeth Bartlett and scheduled appointments for nurse eval at 10:45 and consult with Dr. Sondra Come at 11:15 today.  Discussed that I will call her with an appointment to see Dr. Alvy Bimler.  She verbalized understanding and agreement. ?

## 2022-03-20 NOTE — Progress Notes (Signed)
GYN Location of Tumor / Histology: Endometrial ? ?Elizabeth Bartlett presented with symptoms of: bleeding s/p hysterectomy ? ?Biopsies revealed:  ?A: Cervix, biopsy/endocervical curettage ?- Endometrioid adenocarcinoma, FIGO grade 1-2, with squamous differentiation, see comment ?- Separate detached fragments of benign squamous epithelium ? ?Past/Anticipated interventions by Gyn/Onc surgery, if any: could be considered for primary radical hysterectomy versus EBRT+/- brachytherapy with consideration for interval hysterectomy.  ? ?Past/Anticipated interventions by medical oncology, if any: recommendation would be that we start with pelvic radiation +/- concurrent sensitizing cisplatin.  ? ?Weight changes, if any: no ? ?Bowel/Bladder complaints, if any:   diarrhea, incomplete bladder emptying, nocturia every 2 hours, frequency, urgency ? ?Nausea/Vomiting, if any: no ? ?Pain issues, if any:  mild feeling of menstrual cramp like pain ? ?SAFETY ISSUES: ?Prior radiation? yes, bilateral breasts, 2014 ?Pacemaker/ICD? no ?Possible current pregnancy? no, hysterectomy ?Is the patient on methotrexate? no ? ?Current Complaints / other details:  vaginal bleeding since early December 2022 ? ?Vitals:  ? 03/20/22 1046  ?BP: (!) 158/81  ?Pulse: 83  ?Resp: 20  ?Temp: 97.8 ?F (36.6 ?C)  ?SpO2: 97%  ?Weight: 169 lb (76.7 kg)  ?Height: 5' (1.524 m)  ? ? ? ?

## 2022-03-23 ENCOUNTER — Telehealth: Payer: Self-pay | Admitting: Oncology

## 2022-03-23 ENCOUNTER — Inpatient Hospital Stay: Payer: BC Managed Care – PPO

## 2022-03-23 ENCOUNTER — Encounter: Payer: Self-pay | Admitting: Licensed Clinical Social Worker

## 2022-03-23 ENCOUNTER — Inpatient Hospital Stay: Payer: BC Managed Care – PPO | Attending: Gynecologic Oncology | Admitting: Licensed Clinical Social Worker

## 2022-03-23 DIAGNOSIS — Z79899 Other long term (current) drug therapy: Secondary | ICD-10-CM | POA: Insufficient documentation

## 2022-03-23 DIAGNOSIS — C579 Malignant neoplasm of female genital organ, unspecified: Secondary | ICD-10-CM

## 2022-03-23 DIAGNOSIS — Z800 Family history of malignant neoplasm of digestive organs: Secondary | ICD-10-CM

## 2022-03-23 DIAGNOSIS — C50812 Malignant neoplasm of overlapping sites of left female breast: Secondary | ICD-10-CM

## 2022-03-23 DIAGNOSIS — C541 Malignant neoplasm of endometrium: Secondary | ICD-10-CM | POA: Insufficient documentation

## 2022-03-23 DIAGNOSIS — Z510 Encounter for antineoplastic radiation therapy: Secondary | ICD-10-CM | POA: Insufficient documentation

## 2022-03-23 DIAGNOSIS — Z5111 Encounter for antineoplastic chemotherapy: Secondary | ICD-10-CM | POA: Insufficient documentation

## 2022-03-23 LAB — GENETIC SCREENING ORDER

## 2022-03-23 NOTE — Progress Notes (Signed)
REFERRING PROVIDER: ?Lafonda Mosses, MD ?[address] ?[place],  [place] [address] ? ?PRIMARY PROVIDER:  ?[name], MD ? ?PRIMARY REASON FOR VISIT:  ?1. Bilateral malignant neoplasm of overlapping sites of breast in female, unspecified estrogen receptor status ([place])   ?2. Family history of colon cancer   ?3. Gynecologic malignancy [facility])   ? ? ? ?HISTORY OF PRESENT ILLNESS:   ?Ms. Waddell, a 61 y.o. female, was seen for a [place] cancer genetics consultation at the request of Dr. Berline Lopes due to a personal and family history of cancer.  Ms. Fullam presents to clinic today to discuss the possibility of a hereditary predisposition to cancer, genetic testing, and to further clarify her future cancer risks, as well as potential cancer risks for family members.  ? ?At the age of 61 Ms. 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 The treatment plan included left breast lumpectomy followed by adjuvant radiation and anastrozole, which she completed in 2021. In 2023, at the age of 61, Ms. Wissink was diagnosed with endometrial adenocarcinoma.  ? ?  ?CANCER HISTORY:  ?Oncology History  ? No history exists.  ? ? ? ?RISK FACTORS:  ?Menarche was at age 4.  ?First live birth at age 2.  ?OCP use for approximately 0 years.  ?Ovaries intact: yes.  ?Hysterectomy: yes.  ?Menopausal status: postmenopausal.  ?HRT use: 0 years. ?Colonoscopy: yes; normal. ?Mammogram within the last year: yes. ?Number of breast biopsies: 2. ?Up to date with pelvic exams: yes. ? ?Past Medical History:  ?Diagnosis Date  ? Anxiety   ? BMI 31.0-31.9,adult   ? Breast cancer ([place])   ? Depression   ? High cholesterol   ? Hypothyroidism   ? ? ?Past Surgical History:  ?Procedure Laterality Date  ? BILATERAL SALPINGECTOMY Bilateral 01/31/2013  ? Procedure: BILATERAL SALPINGECTOMY;  Surgeon: Jonnie Kind, MD;  Location: [facility];  Service: Gynecology;  Laterality: Bilateral;  ? CESAREAN SECTION  02/19/1987  ? CHOLECYSTECTOMY   05/22/1987  ? open  ? LAPAROSCOPIC SUPRACERVICAL HYSTERECTOMY N/A 01/31/2013  ? Procedure: LAPAROSCOPIC SUPRACERVICAL HYSTERECTOMY;  Surgeon: Jonnie Kind, MD;  Location: [facility];  Service: Gynecology;  Laterality: N/A;  ? lumpectomy Bilateral   ? With lymph node dissection on the left  ? ? ?Social History  ? ?Socioeconomic History  ? Marital status: Married  ?  Spouse name: Not on file  ? Number of children: Not on file  ? Years of education: Not on file  ? Highest education level: Not on file  ?Occupational History  ? Occupation: Oceanographer  ?Tobacco Use  ? Smoking status: Never  ? Smokeless tobacco: Not on file  ?Vaping Use  ? Vaping Use: Never used  ?Substance and Sexual Activity  ? Alcohol use: No  ? Drug use: No  ? Sexual activity: Not Currently  ?  Partners: Male  ?Other Topics Concern  ? Not on file  ?Social History Narrative  ? Not on file  ? ?Social Determinants of Health  ? ?Financial Resource Strain: Not on file  ?Food Insecurity: Not on file  ?Transportation Needs: Not on file  ?Physical Activity: Not on file  ?Stress: Not on file  ?Social Connections: Not on file  ?  ? ?FAMILY HISTORY:  ?We obtained a detailed, 4-generation family history.  Significant diagnoses are listed below: ?Family History  ?Problem Relation Age of Onset  ? COPD Mother   ? Cancer Father   ?  lung  ? Colon cancer Father   ? Breast cancer Paternal Aunt   ?     dx early 57s  ? Cirrhosis Maternal Grandmother   ?     non-alcoholic cirrhosis  ? Heart attack Maternal Grandfather   ? Diabetes Maternal Grandfather   ? Colon cancer Paternal Grandfather   ?     dx 75s  ? Lymphoma Cousin   ? Ovarian cancer Neg Hx   ? Endometrial cancer Neg Hx   ? Pancreatic cancer Neg Hx   ? Prostate cancer Neg Hx   ? ? ?Ms. Broski has 2 daughters, 24 and 14, no cancers. She had 1 full sister who died in a car accident 30 years ago. She has 1 paternal half brother.  ? ?Ms. Marshburn's mother died at 37 and was an only child. Maternal grandmother died in  her 47s of cirrhosis. Grandfather died at 78. Maternal grandmother had a niece who died of lymphoma and a nephew who died of leukemia at 47. ? ?Ms. Rencher's father died of colon and lung cancer in his 5s. Patient had 2 paternal uncles, 1 aunt. Her aunt had breast cancer in her early 76s. Paternal grandfather had colon cancer in his 62s and died of a recurrence in his 3s.  ? ?Ms. Trivedi is unaware of previous family history of genetic testing for hereditary cancer risks. Patient's maternal ancestors are of unknown descent, and paternal ancestors are of unknown descent. There is on reported Ashkenazi Jewish ancestry. There is no known consanguinity. ? ? ? ?GENETIC COUNSELING ASSESSMENT: Ms. Tseng is a 61 y.o. female with a personal and family history of cancer which is somewhat suggestive of a hereditary cancer syndrome and predisposition to cancer. We, therefore, discussed and recommended the following at today's visit.  ? ?DISCUSSION: We discussed that approximately 10% of cancer is hereditary. Most cases of hereditary breast cancer are associated with BRCA1/BRCA2 genes, although there are other genes associated with hereditary cancer as well including Lynch syndrome genes which increase risk for colon and endometrial cancer. Cancers and risks are gene specific.  We discussed that testing is beneficial for several reasons including knowing about other cancer risks, identifying potential screening and risk-reduction options that may be appropriate, and to understand if other family members could be at risk for cancer and allow them to undergo genetic testing.  ? ?We reviewed the characteristics, features and inheritance patterns of hereditary cancer syndromes. We also discussed genetic testing, including the appropriate family members to test, the process of testing, insurance coverage and turn-around-time for results. We discussed the implications of a negative, positive and/or variant of uncertain significant result. We  recommended Ms. Spear pursue genetic testing for the [facility] CustomNext+RNA gene panel.  ? ?The CustomNext-Cancer+RNAinsight panel offered by [facility] includes sequencing and rearrangement analysis for the following 47 genes:  APC, ATM, AXIN2, BARD1, BMPR1A, BRCA1, BRCA2, BRIP1, CDH1, CDK4, CDKN2A, CHEK2, DICER1, EPCAM, GREM1, HOXB13, MEN1, MLH1, MSH2, MSH3, MSH6, MUTYH, NBN, NF1, NF2, NTHL1, PALB2, PMS2, POLD1, POLE, PTEN, RAD51C, RAD51D, RECQL, RET, SDHA, SDHAF2, SDHB, SDHC, SDHD, SMAD4, SMARCA4, STK11, TP53, TSC1, TSC2, and VHL.  RNA data is routinely analyzed for use in variant interpretation for all genes. ? ?Based on Ms. Armendarez's personal and family history of cancer, she meets medical criteria for genetic testing. Despite that she meets criteria, she may still have an out of pocket cost. We discussed that if her out of pocket cost for testing is over $100, the laboratory will  call and confirm whether she wants to proceed with testing.  If the out of pocket cost of testing is less than $100 she will be billed by the genetic testing laboratory.  ? ?PLAN: After considering the risks, benefits, and limitations, Ms. Fatzinger provided informed consent to pursue genetic testing and the blood sample was sent to [facility] for analysis of the CustomNext+RNA panel. Results should be available within approximately 2-3 weeks' time, at which point they will be disclosed by telephone to Ms. Luginbill, as will any additional recommendations warranted by these results. Ms. Dresner will receive a summary of her genetic counseling visit and a copy of her results once available. This information will also be available in Epic.  ? ?Ms. Dolinsky's questions were answered to her satisfaction today. Our contact information was provided should additional questions or concerns arise. Thank you for the referral and allowing [place] to share in the care of your patient.  ? ?Faith Rogue, MS, LCGC ?Genetic Counselor ?[email or web address] ?Phone:  [phone] ? ?The patient was seen for a total of 30 minutes in face-to-face genetic counseling.   Patient brought her husband. Dr. Grayland Ormond was available for discussion regarding this case.  ? ?____

## 2022-03-23 NOTE — Telephone Encounter (Signed)
[facility] and scheduled new patient appointment with Dr. Alvy Bimler on 03/25/22 at 9:00.  She verbalized understanding and agreement. ?

## 2022-03-25 ENCOUNTER — Other Ambulatory Visit: Payer: Self-pay | Admitting: Hematology and Oncology

## 2022-03-25 ENCOUNTER — Ambulatory Visit
Admission: RE | Admit: 2022-03-25 | Discharge: 2022-03-25 | Disposition: A | Discharge status: Home / Self Care | Payer: BC Managed Care – PPO | Source: Ambulatory Visit | Attending: Radiation Oncology | Admitting: Radiation Oncology

## 2022-03-25 ENCOUNTER — Other Ambulatory Visit: Payer: Self-pay

## 2022-03-25 ENCOUNTER — Telehealth: Payer: Self-pay | Admitting: Oncology

## 2022-03-25 ENCOUNTER — Encounter: Payer: Self-pay | Admitting: Hematology and Oncology

## 2022-03-25 ENCOUNTER — Inpatient Hospital Stay (HOSPITAL_BASED_OUTPATIENT_CLINIC_OR_DEPARTMENT_OTHER): Payer: BC Managed Care – PPO | Admitting: Hematology and Oncology

## 2022-03-25 VITALS — BP 153/83 | HR 91 | Temp 97.3°F | Resp 18 | Ht 60.0 in | Wt 169.3 lb

## 2022-03-25 DIAGNOSIS — C55 Malignant neoplasm of uterus, part unspecified: Secondary | ICD-10-CM | POA: Insufficient documentation

## 2022-03-25 DIAGNOSIS — C50811 Malignant neoplasm of overlapping sites of right female breast: Secondary | ICD-10-CM

## 2022-03-25 DIAGNOSIS — C530 Malignant neoplasm of endocervix: Secondary | ICD-10-CM | POA: Diagnosis not present

## 2022-03-25 DIAGNOSIS — C539 Malignant neoplasm of cervix uteri, unspecified: Secondary | ICD-10-CM

## 2022-03-25 DIAGNOSIS — Z79899 Other long term (current) drug therapy: Secondary | ICD-10-CM | POA: Diagnosis not present

## 2022-03-25 DIAGNOSIS — C579 Malignant neoplasm of female genital organ, unspecified: Secondary | ICD-10-CM

## 2022-03-25 DIAGNOSIS — Z5111 Encounter for antineoplastic chemotherapy: Secondary | ICD-10-CM | POA: Diagnosis present

## 2022-03-25 DIAGNOSIS — Z510 Encounter for antineoplastic radiation therapy: Secondary | ICD-10-CM | POA: Insufficient documentation

## 2022-03-25 DIAGNOSIS — Z853 Personal history of malignant neoplasm of breast: Secondary | ICD-10-CM | POA: Diagnosis not present

## 2022-03-25 DIAGNOSIS — C541 Malignant neoplasm of endometrium: Secondary | ICD-10-CM | POA: Insufficient documentation

## 2022-03-25 DIAGNOSIS — C50812 Malignant neoplasm of overlapping sites of left female breast: Secondary | ICD-10-CM

## 2022-03-25 MED ORDER — PROCHLORPERAZINE MALEATE 10 MG PO TABS: 10.0000 mg | ORAL_TABLET | Freq: Four times a day (QID) | ORAL | 1 refills | Status: DC | PRN

## 2022-03-25 MED ORDER — ONDANSETRON HCL 8 MG PO TABS: 8.0000 mg | ORAL_TABLET | Freq: Two times a day (BID) | ORAL | 1 refills | Status: DC | PRN

## 2022-03-25 MED ORDER — LIDOCAINE-PRILOCAINE 2.5-2.5 % EX CREA: TOPICAL_CREAM | CUTANEOUS | 3 refills | Status: DC

## 2022-03-25 NOTE — Assessment & Plan Note (Signed)
I have reviewed her imaging study, outside records and others ?Overall, she has uterine cancer involving her cervix ?The patient is not deemed not a great surgical candidate due to history of hysterectomy ?We discussed concurrent chemotherapy with radiation treatment approach ? ?We discussed the role of concurrent chemotherapy with radiation followed by chemotherapy for treatment approach of endometrial cancer ? ?We reviewed the NCCN guidelines using multimodality treatment ? ?The decision is based on publication on [id number]-003 study.  ? ?We discussed the role of chemotherapy. The intent is of curative intent. ? ?We discussed some of the risks, benefits, side-effects of cisplatin on days 1 and 28 with radiation followed by 4 cycles of carboplatin & Taxol (initial dose of carboplatin at AUC of 5, to be escalated to AUC of 6 for rest of cycles if her blood count recovers). Treatment is intravenous ? ?Some of the short term side-effects included, though not limited to, including weight loss, life threatening infections, risk of allergic reactions, renal failure, need for transfusions of blood products, nausea, vomiting, change in bowel habits, loss of hair, admission to hospital for various reasons, and risks of death.  ? ?Long term side-effects are also discussed including risks of infertility, permanent damage to nerve function, hearing loss, chronic fatigue, kidney damage with possibility needing hemodialysis, and rare secondary malignancy including bone marrow disorders. ? ?The patient is aware that the response rates discussed earlier is not guaranteed.  After a long discussion, patient made an informed decision to proceed with the prescribed plan of care.  ? ?I will schedule chemo education class, port placement with plan to start treatment next week if possible ?I will see her on a weekly basis for follow-up once treatment starts ?

## 2022-03-25 NOTE — Progress Notes (Signed)
[place] ?CONSULT NOTE ? ?Patient Care Team: ?[name], MD as PCP - General (Cardiology) ? ?ASSESSMENT & PLAN:  ?Uterine cancer ([place]) ?I have reviewed her imaging study, outside records and others ?Overall, she has uterine cancer involving her cervix ?The patient is not deemed not a great surgical candidate due to history of hysterectomy ?We discussed concurrent chemotherapy with radiation treatment approach ? ?We discussed the role of concurrent chemotherapy with radiation followed by chemotherapy for treatment approach of endometrial cancer ? ?We reviewed the NCCN guidelines using multimodality treatment ? ?The decision is based on publication on [id number] study.  ? ?We discussed the role of chemotherapy. The intent is of curative intent. ? ?We discussed some of the risks, benefits, side-effects of cisplatin on days 1 and 28 with radiation followed by 4 cycles of carboplatin & Taxol (initial dose of carboplatin at AUC of 5, to be escalated to AUC of 6 for rest of cycles if her blood count recovers). Treatment is intravenous ? ?Some of the short term side-effects included, though not limited to, including weight loss, life threatening infections, risk of allergic reactions, renal failure, need for transfusions of blood products, nausea, vomiting, change in bowel habits, loss of hair, admission to hospital for various reasons, and risks of death.  ? ?Long term side-effects are also discussed including risks of infertility, permanent damage to nerve function, hearing loss, chronic fatigue, kidney damage with possibility needing hemodialysis, and rare secondary malignancy including bone marrow disorders. ? ?The patient is aware that the response rates discussed earlier is not guaranteed.  After a long discussion, patient made an informed decision to proceed with the prescribed plan of care.  ? ?I will schedule chemo education class, port placement with plan to start treatment next week if  possible ?I will see her on a weekly basis for follow-up once treatment starts ? ?History of bilateral breast cancer ?She has remote history of bilateral breast cancer and has completed minimum 5 years of adjuvant antiestrogen therapy ?She will continue screening mammogram ?Genetic testing is pending ? ?Orders Placed This Encounter  ?Procedures  ? IR IMAGING GUIDED PORT INSERTION  ?  Standing Status:   Future  ?  Standing Expiration Date:   03/26/2023  ?  Order Specific Question:   Reason for Exam (SYMPTOM  OR DIAGNOSIS REQUIRED)  ?  Answer:   need port for chemo  ?  Order Specific Question:   Preferred Imaging Location?  ?  Answer:   [facility]  ? CBC with Differential ([place] Only)  ?  Standing Status:   Standing  ?  Number of Occurrences:   20  ?  Standing Expiration Date:   03/26/2023  ? Magnesium  ?  Standing Status:   Standing  ?  Number of Occurrences:   20  ?  Standing Expiration Date:   03/26/2023  ? Comprehensive metabolic panel  ?  Standing Status:   Standing  ?  Number of Occurrences:   15  ?  Standing Expiration Date:   03/26/2023  ? ? ?The total time spent in the appointment was 60 minutes encounter with patients including review of chart and various tests results, discussions about plan of care and coordination of care plan ? ? All questions were answered. The patient knows to call the clinic with any problems, questions or concerns. No barriers to learning was detected. ? ?Heath Lark, MD ?4/5/20231:22 PM ? ?CHIEF COMPLAINTS/PURPOSE OF CONSULTATION:  ?Uterine cancer, for  further management ? ?HISTORY OF PRESENTING ILLNESS:  ?[facility]lett 61 y.o. female is here because of recent diagnosis of uterine cancer ?She is here accompanied by her husband ?The patient has complicated medical history with prior diagnosis of bilateral breast cancer status post surgery, radiation treatment and adjuvant antiestrogen therapy for at least 5 years ?Her first breast cancer diagnosis was around the age of 15 or  45 ?She had remote history of supra cervical hysterectomy in 2014 ?She started to have postmenopausal bleeding recently and had extensive evaluation with biopsy and imaging study ?Overall, she has early stage disease but due to her complicated history, she is deemed not a good surgical candidate and hence concurrent chemoradiation therapy is recommended ?Currently, she continues to have intermittent spotting and abdominal cramping but not severe ?She has 2 children, both daughters ?She helps to babysit her granddaughter who is around the age of 68 ?She is active in all activities of daily living ? ?I have reviewed her chart and materials related to her cancer extensively and collaborated history with the patient. Summary of oncologic history is as follows: ?Oncology History Overview Note  ?Endometrioid carcinoma  ?  ?Uterine cancer [facility])  ?01/20/2013 Pathology Results  ? Endometrium, biopsy ?- POLYPOID DEGENERATING SECRETORY-TYPE ENDOMETRIUM, SEE COMMENT ?  ?01/31/2013 Surgery  ? She underwent supracervical hysterectomy and bilateral salpingo-oophorectomy.  Uterus was ultimately morcellated for removal. ?  ?01/31/2013 Pathology Results  ? Uterus and bilateral fallopian tubes ?- ENDOMETRIUM: PROLIFERATIVE WITH BENIGN ENDOMETRIAL POLYP. NO HYPERPLASIA OR ?CARCINOMA. ?- MYOMETRIUM: ADENOMYOSIS. LEIOMYOMA. ?- UTERINE SEROSA: UNREMARKABLE. NO ENDOMETRIOSIS OR MALIGNANCY. ?- BILATERAL FALLOPIAN TUBES: UNREMARKABLE. ?  ?12/15/2021 Initial Diagnosis  ? The patient experienced postmenopausal bleeding. ?  ?01/30/2022 Imaging  ? Pelvic ultrasound exam was performed on 2/10 showing 18 x 18 x 16 cm masslike area within the cervix containing internal blood flow, question cervical fibroids or mass/neoplasm.   ?  ?01/30/2022 Pathology Results  ? 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   ?03/10/2022 PET scan  ? Hypermetabolic activity in uterine body consistent with primary uterine carcinoma ?  ?No evidence metastatic disease outside of the uterus ?  ?03/16/2022 Imaging  ? MRI pelvis ?3.6 x 2.8 x 2.9 cm cervical mass replaces and peripherally compresses normal cervical stroma stroma. There is marked thinning of the normal stroma posterolaterally on the right without definite parametrial extension on today's study. No involvement of the rectum. Assessment of the upper posterior bladder wall is limited by surgical scarring and susceptibility artifact from adjacent surgical clips in this region. Within this limitation, no focal bladder wall thickening or abnormal bladder wall enhancement is appreciated. Tumor extends posteriorly to the level of the external os, but no definite vaginal wall involvement appreciable by MRI. ?  ?No evidence for pelvic sidewall lymphadenopathy. ?  ?  ?03/25/2022 Cancer Staging  ? Staging form: Corpus Uteri - Carcinoma and Carcinosarcoma, AJCC 8th Edition ?- Clinical stage from 03/25/2022: Stage II (cT2, cN0, cM0) - Signed by Heath Lark, MD on 03/25/2022 ?Stage prefix: Initial diagnosis ? ?  ?04/03/2022 -  Chemotherapy  ? Patient is on Treatment Plan : Uterine Cisplatin days 1 and days 29 with radiation  ?   ? ? ?MEDICAL HISTORY:  ?Past Medical History:  ?Diagnosis Date  ? Anxiety   ? BMI 31.0-31.9,adult   ? Breast cancer ([place])   ? Depression   ? High  cholesterol   ? Hypothyroidism   ? ? ?SURGICAL HISTORY: ?Past Surgical History:  ?Procedure Laterality Date  ? BILATERAL SALPINGECTOMY Bilateral 01/31/2013  ? Procedure: BILATERAL SALPINGECTOMY;  Surgeon: Jonnie Kind, MD;  Location: [facility];  Service: Gynecology;  Laterality: Bilateral;  ? CESAREAN SECTION  02/19/1987  ? CHOLECYSTECTOMY  05/22/1987  ? open  ? LAPAROSCOPIC SUPRACERVICAL HYSTERECTOMY N/A 01/31/2013  ? Procedure: LAPAROSCOPIC SUPRACERVICAL HYSTERECTOMY;  Surgeon: Jonnie Kind, MD;  Location: [facility];  Service:  Gynecology;  Laterality: N/A;  ? lumpectomy Bilateral   ? With lymph node dissection on the left  ? ? ?SOCIAL HISTORY: ?Social History  ? ?Socioeconomic History  ? Marital status: Married  ?  Spouse name: Not on f

## 2022-03-25 NOTE — Telephone Encounter (Signed)
Left a message with Rosann Auerbach at [facility] to request ER/PR and MSI/MMR testing on accession [id number]. ?

## 2022-03-25 NOTE — Progress Notes (Signed)
START OFF PATHWAY REGIMEN - Uterine ? ? ?[id number]:Cisplatin 40 mg/m2 IV D1 q7 Days + RT: ?  A cycle is every 7 days: ?    Cisplatin  ? ?**Always confirm dose/schedule in your pharmacy ordering system** ? ?Patient Characteristics: ?Endometrioid, Newly Diagnosed (Clinical Staging), Nonsurgical Candidate, Stage I-II ?Histology: Endometrioid ?Therapeutic Status: Newly Diagnosed (Clinical Staging) ?AJCC M Category: cM0 ?AJCC 8 Stage Grouping: II ?AJCC T Category: cT2 ?AJCC N Category: cN0 ?Intent of Therapy: ?Curative Intent, Discussed with Patient ?

## 2022-03-25 NOTE — Assessment & Plan Note (Signed)
She has remote history of bilateral breast cancer and has completed minimum 5 years of adjuvant antiestrogen therapy ?She will continue screening mammogram ?Genetic testing is pending ?

## 2022-03-26 ENCOUNTER — Telehealth: Payer: Self-pay | Admitting: Oncology

## 2022-03-26 ENCOUNTER — Encounter: Payer: Self-pay | Admitting: Hematology and Oncology

## 2022-03-26 ENCOUNTER — Inpatient Hospital Stay: Payer: BC Managed Care – PPO

## 2022-03-26 DIAGNOSIS — C50811 Malignant neoplasm of overlapping sites of right female breast: Secondary | ICD-10-CM

## 2022-03-26 DIAGNOSIS — C530 Malignant neoplasm of endocervix: Secondary | ICD-10-CM

## 2022-03-26 DIAGNOSIS — C541 Malignant neoplasm of endometrium: Secondary | ICD-10-CM | POA: Diagnosis not present

## 2022-03-26 LAB — CBC WITH DIFFERENTIAL (CANCER CENTER ONLY)
Abs Immature Granulocytes: 0.04 10*3/uL (ref 0.00–0.07)
Basophils Absolute: 0.1 10*3/uL (ref 0.0–0.1)
Basophils Relative: 1 %
Eosinophils Absolute: 0.2 10*3/uL (ref 0.0–0.5)
Eosinophils Relative: 2 %
HCT: 43.9 % (ref 36.0–46.0)
Hemoglobin: 14.5 g/dL (ref 12.0–15.0)
Immature Granulocytes: 0 %
Lymphocytes Relative: 14 %
Lymphs Abs: 1.4 10*3/uL (ref 0.7–4.0)
MCH: 29.4 pg (ref 26.0–34.0)
MCHC: 33 g/dL (ref 30.0–36.0)
MCV: 89 fL (ref 80.0–100.0)
Monocytes Absolute: 0.5 10*3/uL (ref 0.1–1.0)
Monocytes Relative: 5 %
Neutro Abs: 7.9 10*3/uL — ABNORMAL HIGH (ref 1.7–7.7)
Neutrophils Relative %: 78 %
Platelet Count: 327 10*3/uL (ref 150–400)
RBC: 4.93 MIL/uL (ref 3.87–5.11)
RDW: 13.5 % (ref 11.5–15.5)
WBC Count: 10.1 10*3/uL (ref 4.0–10.5)
nRBC: 0 % (ref 0.0–0.2)

## 2022-03-26 LAB — COMPREHENSIVE METABOLIC PANEL
ALT: 32 U/L (ref 0–44)
AST: 21 U/L (ref 15–41)
Albumin: 4.5 g/dL (ref 3.5–5.0)
Alkaline Phosphatase: 169 U/L — ABNORMAL HIGH (ref 38–126)
Anion gap: 8 (ref 5–15)
BUN: 17 mg/dL (ref 8–23)
CO2: 27 mmol/L (ref 22–32)
Calcium: 9.6 mg/dL (ref 8.9–10.3)
Chloride: 104 mmol/L (ref 98–111)
Creatinine, Ser: 0.75 mg/dL (ref 0.44–1.00)
GFR, Estimated: >60 mL/min (ref >60)
Glucose, Bld: 102 mg/dL — ABNORMAL HIGH (ref 70–99)
Potassium: 4.4 mmol/L (ref 3.5–5.1)
Sodium: 139 mmol/L (ref 135–145)
Total Bilirubin: 0.7 mg/dL (ref 0.3–1.2)
Total Protein: 7.6 g/dL (ref 6.5–8.1)

## 2022-03-26 LAB — MAGNESIUM: Magnesium: 2.3 mg/dL (ref 1.7–2.4)

## 2022-03-26 NOTE — Telephone Encounter (Signed)
Called [facility] and advised them the PA for EMLA cream has been approved.  They tried to run it through [employer] and the PA hadn't come through yet.  They will keep trying and issue Elizabeth [facility]lett a credit if possible. ?

## 2022-03-27 ENCOUNTER — Other Ambulatory Visit: Payer: Self-pay | Admitting: Radiology

## 2022-03-30 ENCOUNTER — Encounter (HOSPITAL_COMMUNITY): Payer: Self-pay

## 2022-03-30 ENCOUNTER — Ambulatory Visit (HOSPITAL_COMMUNITY)
Admission: RE | Admit: 2022-03-30 | Discharge: 2022-03-30 | Disposition: A | Discharge status: Home / Self Care | Payer: BC Managed Care – PPO | Source: Ambulatory Visit | Attending: Hematology and Oncology | Admitting: Hematology and Oncology

## 2022-03-30 DIAGNOSIS — E039 Hypothyroidism, unspecified: Secondary | ICD-10-CM | POA: Diagnosis not present

## 2022-03-30 DIAGNOSIS — F32A Depression, unspecified: Secondary | ICD-10-CM | POA: Insufficient documentation

## 2022-03-30 DIAGNOSIS — E785 Hyperlipidemia, unspecified: Secondary | ICD-10-CM | POA: Insufficient documentation

## 2022-03-30 DIAGNOSIS — Z853 Personal history of malignant neoplasm of breast: Secondary | ICD-10-CM | POA: Diagnosis not present

## 2022-03-30 DIAGNOSIS — C55 Malignant neoplasm of uterus, part unspecified: Secondary | ICD-10-CM | POA: Insufficient documentation

## 2022-03-30 DIAGNOSIS — F419 Anxiety disorder, unspecified: Secondary | ICD-10-CM | POA: Insufficient documentation

## 2022-03-30 DIAGNOSIS — C579 Malignant neoplasm of female genital organ, unspecified: Secondary | ICD-10-CM

## 2022-03-30 HISTORY — PX: IR IMAGING GUIDED PORT INSERTION: IMG5740

## 2022-03-30 IMAGING — XA IR IMAGING GUIDED PORT INSERTION
2 series · 3 of 3 positions shown · non-contrast
Comparison: none

INDICATION: Uterine malignancy

[Series 1: ir fluoro/shunt/fist · 1 of 1 slices shown]
[im 1/1]
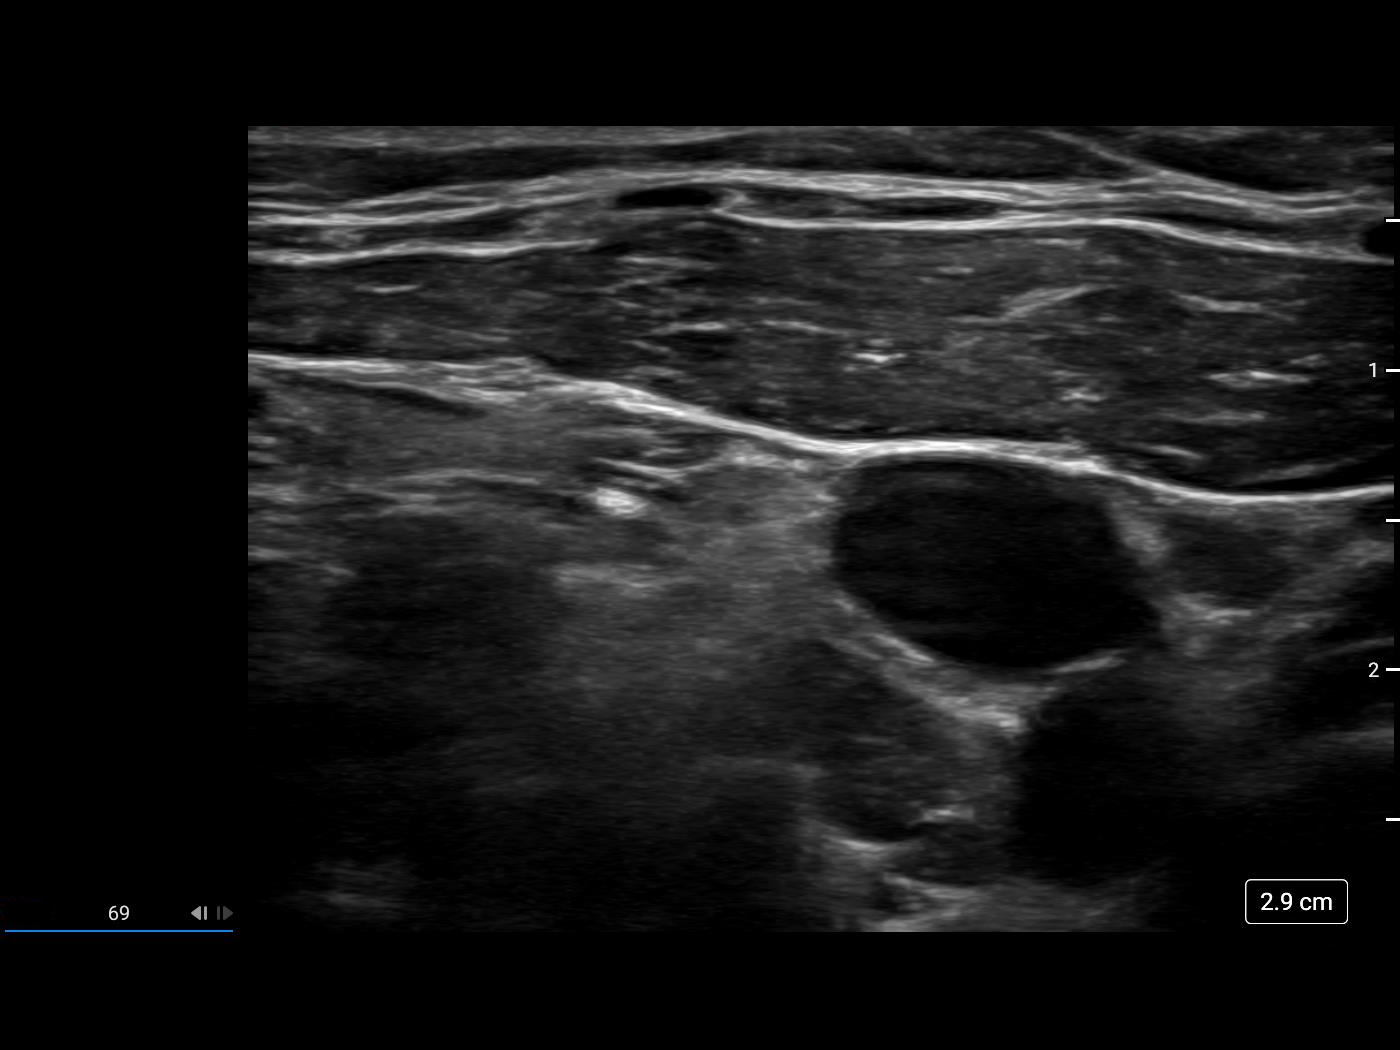

[Series 1: ir imaging guided port insertion · 2 of 2 slices shown]
[im 1/2]
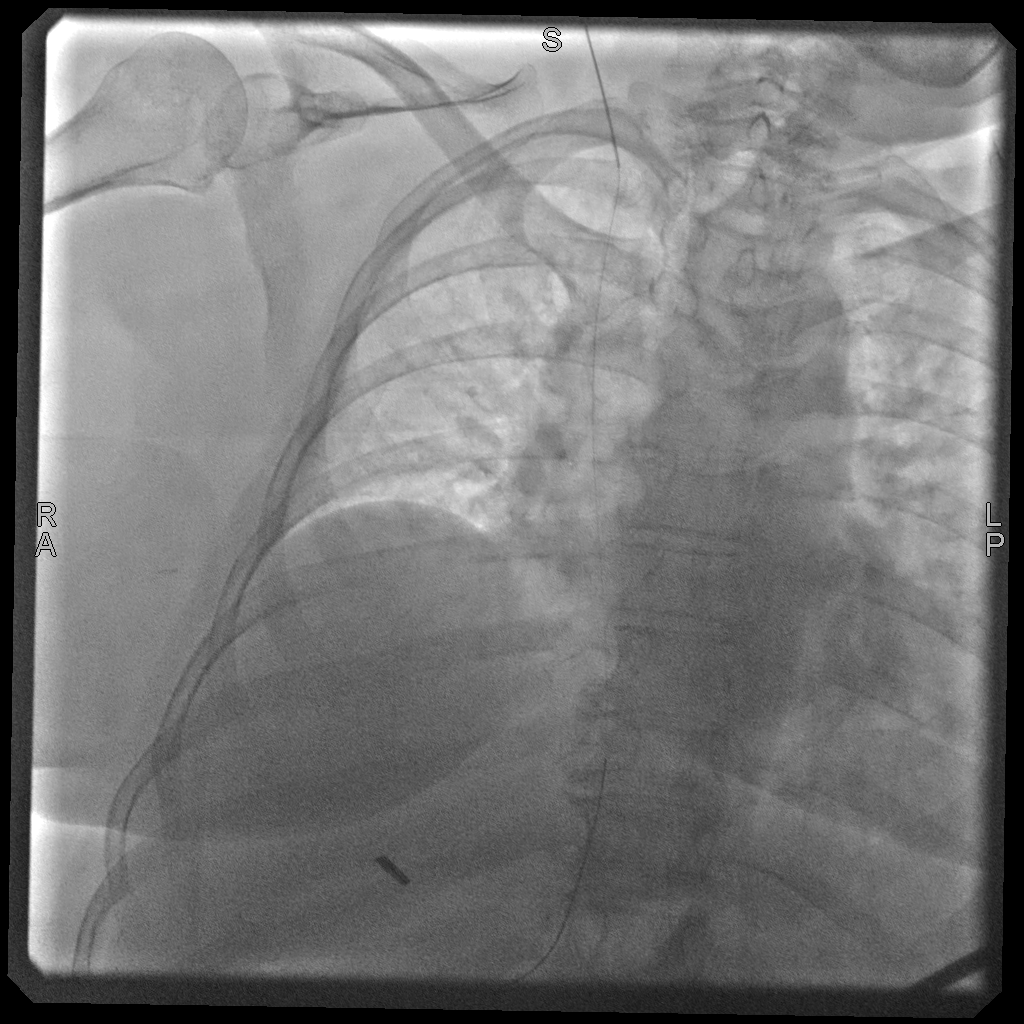
[im 2/2]
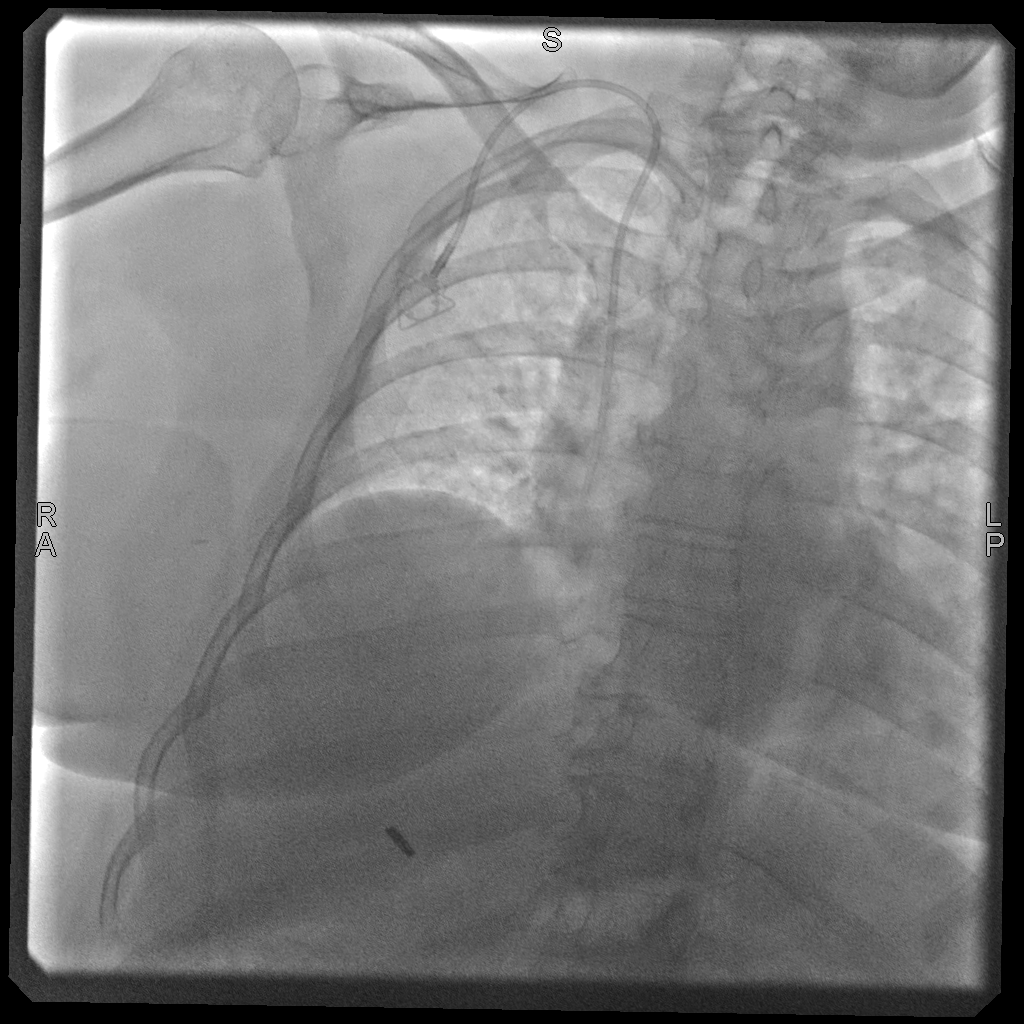

[3 of 3 positions shown; findings below may reference images not displayed]

EXAM:
IMPLANTED PORT A CATH PLACEMENT WITH ULTRASOUND AND FLUOROSCOPIC
GUIDANCE

MEDICATIONS:
None

ANESTHESIA/SEDATION:
Moderate (conscious) sedation was employed during this procedure. A
total of Versed 6 mg and Fentanyl 50 mcg was administered
intravenously by the radiology nurse.

Total intra-service moderate Sedation Time: 19 minutes. The
patient's level of consciousness and vital signs were monitored
continuously by radiology nursing throughout the procedure under my
direct supervision.

FLUOROSCOPY:
Radiation Exposure Index (as provided by the fluoroscopic device): 3
mGy Kerma

COMPLICATIONS:
None immediate.

PROCEDURE:
The procedure, risks, benefits, and alternatives were explained to
the patient. Questions regarding the procedure were encouraged and
answered. The patient understands and consents to the procedure.

A timeout was performed prior to the initiation of the procedure.

Patient positioned supine on the angiography table.

Right neck and anterior upper chest prepped and draped in the usual
sterile fashion. All elements of maximal sterile barrier were
utilized including, cap, mask, sterile gown, sterile gloves, large
sterile drape, hand scrubbing and 2% Chlorhexidine for skin
cleaning.

The right internal jugular vein was evaluated with ultrasound and
shown to be patent. A permanent ultrasound image was obtained and
placed in the patient's medical record. Local anesthesia was
provided with 1% lidocaine with epinephrine.

Using sterile gel and a sterile probe cover, the right internal
jugular vein was entered with a 21 ga needle during real time
ultrasound guidance.

0.018 inch guidewire placed and 21 ga needle exchanged for
transitional dilator set. Utilizing fluoroscopy, 0.035 inch
guidewire advanced through the needle without difficulty.

Attention then turned to the right anterior upper chest. Following
local lidocaine administration, a port pocket was created. The
catheter was connected to the port and brought from the pocket to
the venotomy site through a subcutaneous tunnel.

The catheter was cut to size and inserted through the peel-away
sheath. The catheter tip was positioned at the cavoatrial junction
using fluoroscopic guidance.

The port aspirated and flushed well. The port pocket was closed with
deep and superficial absorbable suture. The port pocket incision and
venotomy sites were also sealed with Dermabond.
IMPRESSION: Successful placement of a right internal jugular approach power
injectable Port-A-Cath. The catheter is ready for immediate use.

## 2022-03-30 MED ORDER — FENTANYL CITRATE (PF) 100 MCG/2ML IJ SOLN
INTRAMUSCULAR | Status: AC
  Filled 2022-03-30: qty 2

## 2022-03-30 MED ORDER — MIDAZOLAM HCL 2 MG/2ML IJ SOLN
INTRAMUSCULAR | Status: AC
  Filled 2022-03-30: qty 4

## 2022-03-30 MED ORDER — LIDOCAINE-EPINEPHRINE 1 %-1:100000 IJ SOLN
INTRAMUSCULAR | Status: AC
  Filled 2022-03-30: qty 1

## 2022-03-30 MED ORDER — LIDOCAINE-EPINEPHRINE 1 %-1:100000 IJ SOLN
INTRAMUSCULAR | Status: AC | PRN
  Administered 2022-03-30: 20 mL

## 2022-03-30 MED ORDER — HEPARIN SOD (PORK) LOCK FLUSH 100 UNIT/ML IV SOLN
INTRAVENOUS | Status: AC
  Filled 2022-03-30: qty 5

## 2022-03-30 MED ORDER — MIDAZOLAM HCL 2 MG/2ML IJ SOLN
INTRAMUSCULAR | Status: AC
  Filled 2022-03-30: qty 2

## 2022-03-30 MED ORDER — HEPARIN SOD (PORK) LOCK FLUSH 100 UNIT/ML IV SOLN
INTRAVENOUS | Status: AC | PRN
  Administered 2022-03-30: 500 [IU] via INTRAVENOUS

## 2022-03-30 MED ORDER — FENTANYL CITRATE (PF) 100 MCG/2ML IJ SOLN
INTRAMUSCULAR | Status: AC | PRN
  Administered 2022-03-30 (×3): 50 ug via INTRAVENOUS

## 2022-03-30 MED ORDER — MIDAZOLAM HCL 2 MG/2ML IJ SOLN
INTRAMUSCULAR | Status: AC | PRN
  Administered 2022-03-30: 1 mg via INTRAVENOUS
  Administered 2022-03-30: 2 mg via INTRAVENOUS
  Administered 2022-03-30: 1 mg via INTRAVENOUS
  Administered 2022-03-30: 2 mg via INTRAVENOUS

## 2022-03-30 MED ORDER — SODIUM CHLORIDE 0.9 % IV SOLN: INTRAVENOUS | Status: DC

## 2022-03-30 NOTE — Discharge Instructions (Signed)
For questions /concerns may call Interventional Radiology at [phone] ° °You may remove your dressing and shower tomorrow afternoon ° °DO NOT use EMLA cream for 2 weeks after port placement as the cream will remove surgical glue on your incision.    ° ° °Implanted Port Insertion, Care After °This sheet gives you information about how to care for yourself after your procedure. Your health care provider may also give you more specific instructions. If you have problems or questions, contact your health careprovider. °What can I expect after the procedure? °After the procedure, it is common to have: °Discomfort at the port insertion site. °Bruising on the skin over the port. This should improve over 3-4 days. °Follow these instructions at home: °Port care °After your port is placed, you will get a manufacturer's information card. The card has information about your port. Keep this card with you at all times. °Take care of the port as told by your health care provider. Ask your health care provider if you or a family member can get training for taking care of the port at home. A home health care nurse may also take care of the port. °Make sure to remember what type of port you have. °Incision care °Follow instructions from your health care provider about how to take care of your port insertion site. Make sure you: °Wash your hands with soap and water before and after you change your bandage (dressing). If soap and water are not available, use hand sanitizer. °Change your dressing as told by your health care provider. °Leave skin glue, or adhesive strips in place. These skin closures may need to stay in place for 2 weeks or longer.  °Check your port insertion site every day for signs of infection. Check for: °     - Redness, swelling, or pain. °                    - Fluid or blood. °     - Warmth. °     - Pus or a bad smell. °Activity °Return to your normal activities as told by your health care provider. Ask your  health care provider what activities are safe for you. °Do not lift anything that is heavier than 10 lb (4.5 kg), or the limit that you are told, until your health care provider says that it is safe. °General instructions °Take over-the-counter and prescription medicines only as told by your health care provider. °Do not take baths, swim, or use a hot tub until your health care provider approves. Ask your health care provider if you may take showers. You may only be allowed to take sponge baths. °Do not drive for 24 hours if you were given a sedative during your procedure. °Wear a medical alert bracelet in case of an emergency. This will tell any health care providers that you have a port. °Keep all follow-up visits as told by your health care provider. This is important. °Contact a health care provider if: °You cannot flush your port with saline as directed, or you cannot draw blood from the port. °You have a fever or chills. °You have redness, swelling, or pain around your port insertion site. °You have fluid or blood coming from your port insertion site. °Your port insertion site feels warm to the touch. °You have pus or a bad smell coming from the port insertion site. °Get help right away if: °You have chest pain or shortness of breath. °You have bleeding from   your port that you cannot control. °Summary °Take care of the port as told by your health care provider. Keep the manufacturer's information card with you at all times. °Change your dressing as told by your health care provider. °Contact a health care provider if you have a fever or chills or if you have redness, swelling, or pain around your port insertion site. °Keep all follow-up visits as told by your health care provider. °This information is not intended to replace advice given to you by your health care provider. Make sure you discuss any questions you have with your healthcare provider. ° °Moderate Conscious Sedation, Adult, Care After °This sheet  gives you information about how to care for yourself after your procedure. Your health care provider may also give you more specific instructions. If you have problems or questions, contact your health careprovider. °What can I expect after the procedure? °After the procedure, it is common to have: °Sleepiness for several hours. °Impaired judgment for several hours. °Difficulty with balance. °Vomiting if you eat too soon. °Follow these instructions at home: °For the time period you were told by your health care provider: °Rest. °Do not participate in activities where you could fall or become injured. °Do not drive or use machinery. °Do not drink alcohol. °Do not take sleeping pills or medicines that cause drowsiness. °Do not make important decisions or sign legal documents. °Do not take care of children on your own. °Eating and drinking ° °Follow the diet recommended by your health care provider. °Drink enough fluid to keep your urine pale yellow. °If you vomit: °Drink water, juice, or soup when you can drink without vomiting. °Make sure you have little or no nausea before eating solid foods. ° °General instructions °Take over-the-counter and prescription medicines only as told by your health care provider. °Have a responsible adult stay with you for the time you are told. It is important to have someone help care for you until you are awake and alert. °Do not smoke. °Keep all follow-up visits as told by your health care provider. This is important. °Contact a health care provider if: °You are still sleepy or having trouble with balance after 24 hours. °You feel light-headed. °You keep feeling nauseous or you keep vomiting. °You develop a rash. °You have a fever. °You have redness or swelling around the IV site. °Get help right away if: °You have trouble breathing. °You have new-onset confusion at home. °Summary °After the procedure, it is common to feel sleepy, have impaired judgment, or feel nauseous if you eat  too soon. °Rest after you get home. Know the things you should not do after the procedure. °Follow the diet recommended by your health care provider and drink enough fluid to keep your urine pale yellow. °Get help right away if you have trouble breathing or new-onset confusion at home. °This information is not intended to replace advice given to you by your health care provider. Make sure you discuss any questions you have with your healthcare provider. °Document Revised: 04/05/2020 Document Reviewed: 11/02/2019 °Elsevier Patient Education © 2022 [employer]  °

## 2022-03-30 NOTE — Consult Note (Signed)
? ?Chief Complaint: ?Patient was seen in consultation today for port a cath placement ? ?Referring Physician(s): ?Gorsuch,Ni ? ?Supervising Physician: Mir, Sharen Heck ? ?Patient Status: Elizabeth Bartlett ? ?History of Present Illness: ?Elizabeth Bartlett is a 61 y.o. female with past medical history of anxiety/depression, hyperlipidemia, hypothyroidism, remote bilateral breast cancer and now with newly diagnosed uterine cancer.  She is scheduled today for Port-A-Cath placement to assist with treatment. ? ?Past Medical History:  ?Diagnosis Date  ? Anxiety   ? BMI 31.0-31.9,adult   ? Breast cancer ([place])   ? Depression   ? High cholesterol   ? Hypothyroidism   ? ? ?Past Surgical History:  ?Procedure Laterality Date  ? BILATERAL SALPINGECTOMY Bilateral 01/31/2013  ? Procedure: BILATERAL SALPINGECTOMY;  Surgeon: Jonnie Kind, MD;  Location: [facility];  Service: Gynecology;  Laterality: Bilateral;  ? CESAREAN SECTION  02/19/1987  ? CHOLECYSTECTOMY  05/22/1987  ? open  ? LAPAROSCOPIC SUPRACERVICAL HYSTERECTOMY N/A 01/31/2013  ? Procedure: LAPAROSCOPIC SUPRACERVICAL HYSTERECTOMY;  Surgeon: Jonnie Kind, MD;  Location: [facility];  Service: Gynecology;  Laterality: N/A;  ? lumpectomy Bilateral   ? With lymph node dissection on the left  ? ? ?Allergies: ?Patient has no known allergies. ? ?Medications: ?Prior to Admission medications   ?Medication Sig Start Date End Date Taking? Authorizing Provider  ?acetaminophen (TYLENOL) 500 MG tablet Take 1,000 mg by mouth every 6 (six) hours as needed.   Yes [provider]  ?atorvastatin (LIPITOR) 10 MG tablet Take 5 mg by mouth daily. 01/27/22  Yes [provider]  ?buPROPion (WELLBUTRIN SR) 150 MG 12 hr tablet Take 150 mg by mouth 2 (two) times daily. 01/27/22  Yes [provider]  ?FLUoxetine (PROZAC) 40 MG capsule Take 40 mg by mouth daily.   Yes [provider]  ?fluticasone (FLONASE) 50 MCG/ACT nasal spray Place 2 sprays into the nose daily as needed. Sinus  Pressure   Yes [provider]  ?levothyroxine (SYNTHROID, LEVOTHROID) 25 MCG tablet Take 25 mcg by mouth daily.   Yes [provider]  ?meloxicam (MOBIC) 15 MG tablet Take 15 mg by mouth daily. 01/27/22  Yes [provider]  ?lidocaine-prilocaine (EMLA) cream Apply to affected area once 03/25/22   Heath Lark, MD  ?ondansetron (ZOFRAN) 8 MG tablet Take 1 tablet (8 mg total) by mouth 2 (two) times daily as needed. Start on the third day after cisplatin chemotherapy. 03/25/22   Heath Lark, MD  ?prochlorperazine (COMPAZINE) 10 MG tablet Take 1 tablet (10 mg total) by mouth every 6 (six) hours as needed (Nausea or vomiting). 03/25/22   Heath Lark, MD  ?  ? ?Family History  ?Problem Relation Age of Onset  ? COPD Mother   ? Cancer Father   ?     lung  ? Colon cancer Father   ? Breast cancer Paternal Aunt   ?     dx early 59s  ? Cirrhosis Maternal Grandmother   ?     non-alcoholic cirrhosis  ? Heart attack Maternal Grandfather   ? Diabetes Maternal Grandfather   ? Colon cancer Paternal Grandfather   ?     dx 63s  ? Lymphoma Cousin   ? Ovarian cancer Neg Hx   ? Endometrial cancer Neg Hx   ? Pancreatic cancer Neg Hx   ? Prostate cancer Neg Hx   ? ? ?Social History  ? ?Socioeconomic History  ? Marital status: Married  ?  Spouse name: Not on file  ?  Number of children: Not on file  ? Years of education: Not on file  ? Highest education level: Not on file  ?Occupational History  ? Occupation: Oceanographer  ?Tobacco Use  ? Smoking status: Never  ? Smokeless tobacco: Not on file  ?Vaping Use  ? Vaping Use: Never used  ?Substance and Sexual Activity  ? Alcohol use: No  ? Drug use: No  ? Sexual activity: Not Currently  ?  Partners: Male  ?Other Topics Concern  ? Not on file  ?Social History Narrative  ? Not on file  ? ?Social Determinants of Health  ? ?Financial Resource Strain: Not on file  ?Food Insecurity: Not on file  ?Transportation Needs: Not on file  ?Physical Activity: Not on file  ?Stress: Not  on file  ?Social Connections: Not on file  ? ? ? ?Review of Systems currently denies fever, headache, chest pain, dyspnea, cough, nausea, vomiting; she does have left lower abdominal/pelvic discomfort and back pain as well as vaginal bleeding ? ?Vital Signs: ?BP (!) 175/93 (BP Location: Right Arm)   Pulse 75   Temp 97.6 ?F (36.4 ?C) (Oral)   Resp 15   LMP 01/27/2013   SpO2 97%  ? ?Physical Exam awake, alert.  Chest clear to auscultation bilaterally.  Heart with regular rate /rhythm.  Abdomen soft, positive bowel sounds, mildly tender left lower quadrant to palpation.  No lower extremity edema ? ?Imaging: ?MR Pelvis W Wo Contrast ? ?Result Date: 03/16/2022 ?CLINICAL DATA:  Status post supracervical hysterectomy with endometrioid adenocarcinoma of the cervix. Staging. EXAM: MRI PELVIS WITHOUT AND WITH CONTRAST TECHNIQUE: Multiplanar multisequence MR imaging of the pelvis was performed both before and after administration of intravenous contrast. CONTRAST:  47m GADAVIST GADOBUTROL 1 MMOL/ML IV SOLN COMPARISON:  PET-CT 03/09/2022. FINDINGS: Urinary Tract:  Bladder is decompressed. Bowel:  Unremarkable visualized pelvic bowel loops. Vascular/Lymphatic: No pathologically enlarged lymph nodes. No significant vascular abnormality seen. Reproductive: Status post supracervical hysterectomy. No adnexal mass. 3.6 x 2.8 x 2.9 cm cervical mass is identified in the cervix. This replaces and peripherally compresses the normal low signal intensity cervical stroma. Fat plane between the rectum and the cervix is well preserved without evidence of rectal involvement. There is no posterior bladder wall thickening and fat plane between the posterior bladder wall and cervix is preserved in some areas. Assessment of the upper and posterior bladder wall and cervix is limited by surgical scarring and susceptibility artifact from an adjacent cervical clip. No abnormal bladder wall enhancement or focal bladder wall thickening evident.  Compressed cervical stroma posterolaterally on the right is markedly thin with abnormal signal intensity extending out to the margin although no gross parametrial extension can be identified. Abnormal signal/tumor in the posterior service extends down to the external os, but no associated vaginal wall thickening or involvement is appreciated. Other:  No intraperitoneal free fluid. Musculoskeletal: No focal suspicious marrow enhancement within the visualized bony anatomy. IMPRESSION: 3.6 x 2.8 x 2.9 cm cervical mass replaces and peripherally compresses normal cervical stroma stroma. There is marked thinning of the normal stroma posterolaterally on the right without definite parametrial extension on today's study. No involvement of the rectum. Assessment of the upper posterior bladder wall is limited by surgical scarring and susceptibility artifact from adjacent surgical clips in this region. Within this limitation, no focal bladder wall thickening or abnormal bladder wall enhancement is appreciated. Tumor extends posteriorly to the level of the external os, but no definite vaginal wall involvement appreciable by  MRI. No evidence for pelvic sidewall lymphadenopathy. Electronically Signed   By: Misty Stanley M.D.   On: 03/16/2022 09:41  ? ?NM PET Image Initial (PI) Skull Base To Thigh (F-18 FDG) ? ?Result Date: 03/10/2022 ?CLINICAL DATA:  Initial treatment strategy for uterine/cervical carcinoma. EXAM: NUCLEAR MEDICINE PET SKULL BASE TO THIGH TECHNIQUE: 7.96 mCi F-18 FDG was injected intravenously. Full-ring PET imaging was performed from the skull base to thigh after the radiotracer. CT data was obtained and used for attenuation correction and anatomic localization. Fasting blood glucose: 110 mg/dl COMPARISON:  None. FINDINGS: Mediastinal blood pool activity: SUV max 2.48 Liver activity: SUV max NA NECK: No hypermetabolic lymph nodes in the neck. Incidental CT findings: none CHEST: No hypermetabolic mediastinal or  hilar nodes. No suspicious pulmonary nodules on the CT scan. Incidental CT findings: none ABDOMEN/PELVIS: Intense activity within the uterine body with SUV max equal 15.8. No evidence of extension beyond the myometriu

## 2022-03-30 NOTE — Procedures (Signed)
Interventional Radiology Procedure Note  Procedure: Port placement.  Indication: Uterine Ca.  Findings: Please refer to procedural dictation for full description.  Complications: None  EBL: < 10 mL  Sartaj Hoskin, MD [phone]   

## 2022-04-01 ENCOUNTER — Ambulatory Visit: Payer: Self-pay | Admitting: Licensed Clinical Social Worker

## 2022-04-01 ENCOUNTER — Encounter: Payer: Self-pay | Admitting: Licensed Clinical Social Worker

## 2022-04-01 ENCOUNTER — Telehealth: Payer: Self-pay | Admitting: Licensed Clinical Social Worker

## 2022-04-01 DIAGNOSIS — Z1379 Encounter for other screening for genetic and chromosomal anomalies: Secondary | ICD-10-CM

## 2022-04-01 DIAGNOSIS — C541 Malignant neoplasm of endometrium: Secondary | ICD-10-CM | POA: Diagnosis not present

## 2022-04-01 NOTE — Progress Notes (Signed)
HPI:  Ms. Elizabeth Bartlett was previously seen in the [place] clinic due to a personal and family history of cancer and concerns regarding a hereditary predisposition to cancer. Please refer to our prior cancer genetics clinic note for more information regarding our discussion, assessment and recommendations, at the time. Ms. Elizabeth Bartlett's recent genetic test results were disclosed to her, as were recommendations warranted by these results. These results and recommendations are discussed in more detail below. ? 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  ?Uterine cancer [facility])  ?01/20/2013 Pathology Results  ? Endometrium, biopsy ?- POLYPOID DEGENERATING SECRETORY-TYPE ENDOMETRIUM, SEE COMMENT ?  ?01/31/2013 Surgery  ? She underwent supracervical hysterectomy and bilateral salpingo-oophorectomy.  Uterus was ultimately morcellated for removal. ?  ?01/31/2013 Pathology Results  ? Uterus and bilateral fallopian tubes ?- ENDOMETRIUM: PROLIFERATIVE WITH BENIGN ENDOMETRIAL POLYP. NO HYPERPLASIA OR ?CARCINOMA. ?- MYOMETRIUM: ADENOMYOSIS. LEIOMYOMA. ?- UTERINE SEROSA: UNREMARKABLE. NO ENDOMETRIOSIS OR MALIGNANCY. ?- BILATERAL FALLOPIAN TUBES: UNREMARKABLE. ?  ?12/15/2021 Initial Diagnosis  ? The patient experienced postmenopausal bleeding. ?  ?01/30/2022 Imaging  ? Pelvic ultrasound exam was performed on 2/10 showing 18 x 18 x 16 cm masslike area within the cervix containing internal blood flow, question cervical fibroids or mass/neoplasm.   ?  ?01/30/2022 Pathology Results  ? 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  ?03/10/2022 PET scan  ? Hypermetabolic activity in uterine body consistent with primary uterine carcinoma ?  ?No evidence metastatic disease outside of the uterus ?  ?03/16/2022 Imaging  ? MRI pelvis ?3.6 x 2.8 x 2.9  cm cervical mass replaces and peripherally compresses normal cervical stroma stroma. There is marked thinning of the normal stroma posterolaterally on the right without definite parametrial extension on today's study. No involvement of the rectum. Assessment of the upper posterior bladder wall is limited by surgical scarring and susceptibility artifact from adjacent surgical clips in this region. Within this limitation, no focal bladder wall thickening or abnormal bladder wall enhancement is appreciated. Tumor extends posteriorly to the level of the external os, but no definite vaginal wall involvement appreciable by MRI. ?  ?No evidence for pelvic sidewall lymphadenopathy. ?  ?  ?03/25/2022 Cancer Staging  ? Staging form: Corpus Uteri - Carcinoma and Carcinosarcoma, AJCC 8th Edition ?- Clinical stage from 03/25/2022: Stage II (cT2, cN0, cM0) - Signed by Heath Lark, MD on 03/25/2022 ?Stage prefix: Initial diagnosis ? ?  ?03/31/2022 Procedure  ? Successful placement of a right internal jugular approach power injectable Port-A-Cath. The catheter is ready for immediate use. ?  ?  ?04/03/2022 -  Chemotherapy  ? Patient is on Treatment Plan : Uterine Cisplatin days 1 and days 29 with radiation  ?   ? ? ?FAMILY HISTORY:  ?We obtained a detailed, 4-generation family history.  Significant diagnoses are listed below: ?Family History  ?Problem Relation Age of Onset  ? COPD Mother   ? Cancer Father   ?     lung  ? Colon cancer Father   ? Breast cancer Paternal Aunt   ?     dx early 69s  ? Cirrhosis Maternal Grandmother   ?     non-alcoholic cirrhosis  ? Heart attack Maternal Grandfather   ? Diabetes Maternal Grandfather   ? Colon cancer Paternal Grandfather   ?     dx 4s  ? Lymphoma Cousin   ?  Ovarian cancer Neg Hx   ? Endometrial cancer Neg Hx   ? Pancreatic cancer Neg Hx   ? Prostate cancer Neg Hx   ? ?Ms. Elizabeth Bartlett has 2 daughters, 103 and 78, no cancers. She had 1 full sister who died in a car accident 30 years ago. She has 1 paternal  half brother.  ?  ?Ms. Elizabeth Bartlett's mother died at 17 and was an only child. Maternal grandmother died in her 63s of cirrhosis. Grandfather died at 59. Maternal grandmother had a niece who died of lymphoma and a nephew who died of leukemia at 44. ?  ?Ms. Elizabeth Bartlett's father died of colon and lung cancer in his 36s. Patient had 2 paternal uncles, 1 aunt. Her aunt had breast cancer in her early 65s. Paternal grandfather had colon cancer in his 52s and died of a recurrence in his 24s.  ?  ?Ms. Elizabeth Bartlett is unaware of previous family history of genetic testing for hereditary cancer risks. Patient's maternal ancestors are of unknown descent, and paternal ancestors are of unknown descent. There is on reported Ashkenazi Jewish ancestry. There is no known consanguinity. ?  ?  ? ? ?GENETIC TEST RESULTS: Genetic testing reported out on 04/01/2022 through the [facility] CustomNext+RNA cancer panel found no pathogenic mutations.  ? ?The CustomNext-Cancer+RNAinsight panel offered by [facility] includes sequencing and rearrangement analysis for the following 47 genes:  APC, ATM, AXIN2, BARD1, BMPR1A, BRCA1, BRCA2, BRIP1, CDH1, CDK4, CDKN2A, CHEK2, DICER1, EPCAM, GREM1, HOXB13, MEN1, MLH1, MSH2, MSH3, MSH6, MUTYH, NBN, NF1, NF2, NTHL1, PALB2, PMS2, POLD1, POLE, PTEN, RAD51C, RAD51D, RECQL, RET, SDHA, SDHAF2, SDHB, SDHC, SDHD, SMAD4, SMARCA4, STK11, TP53, TSC1, TSC2, and VHL.  RNA data is routinely analyzed for use in variant interpretation for all genes.  ? ?The test report has been scanned into EPIC and is located under the Molecular Pathology section of the Results Review tab.  A portion of the result report is included below for reference.  ? ? ? ?We discussed that because current genetic testing is not perfect, it is possible there may be a gene mutation in one of these genes that current testing cannot detect, but that chance is small.  There could be another gene that has not yet been discovered, or that we have not yet tested, that is  responsible for the cancer diagnoses in the family. It is also possible there is a hereditary cause for the cancer in the family that Ms. Magnan did not inherit and therefore was not identified in her testing.  Therefore, it is important to remain in touch with cancer genetics in the future so that we can continue to offer Ms. Schue the most up to date genetic testing.  ? ?ADDITIONAL GENETIC TESTING: We discussed with Ms. Standen that her genetic testing was fairly extensive.  If there are genes identified to increase cancer risk that can be analyzed in the future, we would be happy to discuss and coordinate this testing at that time.   ? ?CANCER SCREENING RECOMMENDATIONS: Ms. Ayler's test result is considered negative (normal).  This means that we have not identified a hereditary cause for her  personal and family history of cancer at this time. Most cancers happen by chance and this negative test suggests that her cancer may fall into this category.   ? ?While reassuring, this does not definitively rule out a hereditary predisposition to cancer. It is still possible that there could be genetic mutations that are undetectable by current technology. There could be  genetic mutations in genes that have not been tested or identified to increase cancer risk.  Therefore, it is recommended she continue to follow the cancer management and screening guidelines provided by her oncology and primary healthcare provider.  ? ?An individual's cancer risk and medical management are not determined by genetic test results alone. Overall cancer risk assessment incorporates additional factors, including personal medical history, family history, and any available genetic information that may result in a personalized plan for cancer prevention and surveillance. ? ?RECOMMENDATIONS FOR FAMILY MEMBERS:  Relatives in this family might be at some increased risk of developing cancer, over the general population risk, simply due to the family history of  cancer.  We recommended female relatives in this family have a yearly mammogram beginning at age 64, or 72 years younger than the earliest onset of cancer, an annual clinical breast exam, and perform monthly br

## 2022-04-01 NOTE — Telephone Encounter (Signed)
Revealed negative genetic testing.  This normal result is reassuring and indicates that it is unlikely Elizabeth Bartlett's cancer is due to a hereditary cause.  It is unlikely that there is an increased risk of another cancer due to a mutation in one of these genes.  However, genetic testing is not perfect, and cannot definitively rule out a hereditary cause.  It will be important for her to keep in contact with genetics to learn if any additional testing may be needed in the future.    ? ?

## 2022-04-03 ENCOUNTER — Other Ambulatory Visit: Payer: Self-pay

## 2022-04-03 ENCOUNTER — Inpatient Hospital Stay: Payer: BC Managed Care – PPO

## 2022-04-03 VITALS — BP 166/87 | HR 81 | Temp 98.6°F | Resp 16 | Ht 60.0 in | Wt 168.0 lb

## 2022-04-03 DIAGNOSIS — C541 Malignant neoplasm of endometrium: Secondary | ICD-10-CM | POA: Diagnosis not present

## 2022-04-03 DIAGNOSIS — C530 Malignant neoplasm of endocervix: Secondary | ICD-10-CM

## 2022-04-03 MED ORDER — SODIUM CHLORIDE 0.9 % IV SOLN
10.0000 mg | Freq: Once | INTRAVENOUS | Status: AC
  Administered 2022-04-03: 10 mg via INTRAVENOUS
  Filled 2022-04-03: qty 10

## 2022-04-03 MED ORDER — MAGNESIUM SULFATE 2 GM/50ML IV SOLN
2.0000 g | Freq: Once | INTRAVENOUS | Status: AC
  Administered 2022-04-03: 2 g via INTRAVENOUS
  Filled 2022-04-03: qty 50

## 2022-04-03 MED ORDER — POTASSIUM CHLORIDE IN NACL 20-0.9 MEQ/L-% IV SOLN
Freq: Once | INTRAVENOUS | Status: AC
  Filled 2022-04-03: qty 1000

## 2022-04-03 MED ORDER — PALONOSETRON HCL INJECTION 0.25 MG/5ML
0.2500 mg | Freq: Once | INTRAVENOUS | Status: AC
  Administered 2022-04-03: 0.25 mg via INTRAVENOUS
  Filled 2022-04-03: qty 5

## 2022-04-03 MED ORDER — HEPARIN SOD (PORK) LOCK FLUSH 100 UNIT/ML IV SOLN
500.0000 [IU] | Freq: Once | INTRAVENOUS | Status: AC | PRN
  Administered 2022-04-03: 500 [IU]

## 2022-04-03 MED ORDER — SODIUM CHLORIDE 0.9 % IV SOLN
50.0000 mg/m2 | Freq: Once | INTRAVENOUS | Status: AC
  Administered 2022-04-03: 90 mg via INTRAVENOUS
  Filled 2022-04-03: qty 90

## 2022-04-03 MED ORDER — SODIUM CHLORIDE 0.9% FLUSH
10.0000 mL | INTRAVENOUS | Status: DC | PRN
  Administered 2022-04-03: 10 mL

## 2022-04-03 MED ORDER — SODIUM CHLORIDE 0.9 % IV SOLN: Freq: Once | INTRAVENOUS | Status: AC

## 2022-04-03 MED ORDER — SODIUM CHLORIDE 0.9 % IV SOLN
150.0000 mg | Freq: Once | INTRAVENOUS | Status: AC
  Administered 2022-04-03: 150 mg via INTRAVENOUS
  Filled 2022-04-03: qty 150

## 2022-04-03 NOTE — Patient Instructions (Signed)
[place]   ?Discharge Instructions: ?Thank you for choosing [facility] to provide your oncology and hematology care.  ? ?If you have a lab appointment with the [facility], please go directly to the [facility] and check in at the registration area. ?  ?Wear comfortable clothing and clothing appropriate for easy access to any Portacath or PICC line.  ? ?We strive to give you quality time with your provider. You may need to reschedule your appointment if you arrive late (15 or more minutes).  Arriving late affects you and other patients whose appointments are after yours.  Also, if you miss three or more appointments without notifying the office, you may be dismissed from the clinic at the provider?s discretion.    ?  ?For prescription refill requests, have your pharmacy contact our office and allow 72 hours for refills to be completed.   ? ?Today you received the following chemotherapy and/or immunotherapy agents: cisplatin    ?  ?To help prevent nausea and vomiting after your treatment, we encourage you to take your nausea medication as directed. ? ?BELOW ARE SYMPTOMS THAT SHOULD BE REPORTED IMMEDIATELY: ?*FEVER GREATER THAN 100.4 F (38 ?C) OR HIGHER ?*CHILLS OR SWEATING ?*NAUSEA AND VOMITING THAT IS NOT CONTROLLED WITH YOUR NAUSEA MEDICATION ?*UNUSUAL SHORTNESS OF BREATH ?*UNUSUAL BRUISING OR BLEEDING ?*URINARY PROBLEMS (pain or burning when urinating, or frequent urination) ?*BOWEL PROBLEMS (unusual diarrhea, constipation, pain near the anus) ?TENDERNESS IN MOUTH AND THROAT WITH OR WITHOUT PRESENCE OF ULCERS (sore throat, sores in mouth, or a toothache) ?UNUSUAL RASH, SWELLING OR PAIN  ?UNUSUAL VAGINAL DISCHARGE OR ITCHING  ? ?Items with * indicate a potential emergency and should be followed up as soon as possible or go to the Emergency Department if any problems should occur. ? ?Please show the CHEMOTHERAPY ALERT CARD or IMMUNOTHERAPY ALERT CARD at check-in  to the Emergency Department and triage nurse. ? ?Should you have questions after your visit or need to cancel or reschedule your appointment, please contact [facility]  Dept: [phone]  and follow the prompts.  Office hours are 8:00 a.m. to 4:30 p.m. Monday - Friday. Please note that voicemails left after 4:00 p.m. may not be returned until the following business day.  We are closed weekends and major holidays. You have access to a nurse at all times for urgent questions. Please call the main number to the clinic Dept: [phone] and follow the prompts. ? ? ?For any non-urgent questions, you may also contact your provider using MyChart. We now offer e-Visits for anyone 25 and older to request care online for non-urgent symptoms. For details visit [email or web address]. ?  ?Also download the MyChart app! Go to the app store, search "MyChart", open the app, select [facility], and log in with your MyChart username and password. ? ?Due to Covid, a mask is required upon entering the hospital/clinic. If you do not have a mask, one will be given to you upon arrival. For doctor visits, patients may have 1 support person aged 46 or older with them. For treatment visits, patients cannot have anyone with them due to current Covid guidelines and our immunocompromised population.  ? ?Cisplatin injection ?What is this medication? ?CISPLATIN (SIS pla tin) is a chemotherapy drug. It targets fast dividing cells, like cancer cells, and causes these cells to die. This medicine is used to treat many types of cancer like bladder, ovarian, and testicular cancers. ?This medicine may be  used for other purposes; ask your health care provider or pharmacist if you have questions. ?COMMON BRAND NAME(S): Platinol, Platinol -AQ ?What should I tell my care team before I take this medication? ?They need to know if you have any of these conditions: ?eye disease, vision problems ?hearing problems ?kidney  disease ?low blood counts, like white cells, platelets, or red blood cells ?tingling of the fingers or toes, or other nerve disorder ?an unusual or allergic reaction to cisplatin, carboplatin, oxaliplatin, other medicines, foods, dyes, or preservatives ?pregnant or trying to get pregnant ?breast-feeding ?How should I use this medication? ?This drug is given as an infusion into a vein. It is administered in a hospital or clinic by a specially trained health care professional. ?Talk to your pediatrician regarding the use of this medicine in children. Special care may be needed. ?Overdosage: If you think you have taken too much of this medicine contact a poison control center or emergency room at once. ?NOTE: This medicine is only for you. Do not share this medicine with others. ?What if I miss a dose? ?It is important not to miss a dose. Call your doctor or health care professional if you are unable to keep an appointment. ?What may interact with this medication? ?This medicine may interact with the following medications: ?foscarnet ?certain antibiotics like amikacin, gentamicin, neomycin, polymyxin B, streptomycin, tobramycin, vancomycin ?This list may not describe all possible interactions. Give your health care provider a list of all the medicines, herbs, non-prescription drugs, or dietary supplements you use. Also tell them if you smoke, drink alcohol, or use illegal drugs. Some items may interact with your medicine. ?What should I watch for while using this medication? ?Your condition will be monitored carefully while you are receiving this medicine. You will need important blood work done while you are taking this medicine. ?This drug may make you feel generally unwell. This is not uncommon, as chemotherapy can affect healthy cells as well as cancer cells. Report any side effects. Continue your course of treatment even though you feel ill unless your doctor tells you to stop. ?This medicine may increase your  risk of getting an infection. Call your healthcare professional for advice if you get a fever, chills, or sore throat, or other symptoms of a cold or flu. Do not treat yourself. Try to avoid being around people who are sick. ?Avoid taking medicines that contain aspirin, acetaminophen, ibuprofen, naproxen, or ketoprofen unless instructed by your healthcare professional. These medicines may hide a fever. ?This medicine may increase your risk to bruise or bleed. Call your doctor or health care professional if you notice any unusual bleeding. ?Be careful brushing and flossing your teeth or using a toothpick because you may get an infection or bleed more easily. If you have any dental work done, tell your dentist you are receiving this medicine. ?Do not become pregnant while taking this medicine or for 14 months after stopping it. Women should inform their healthcare professional if they wish to become pregnant or think they might be pregnant. Men should not father a child while taking this medicine and for 11 months after stopping it. There is potential for serious side effects to an unborn child. Talk to your healthcare professional for more information. ?Do not breast-feed an infant while taking this medicine. ?This medicine has caused ovarian failure in some women. This medicine may make it more difficult to get pregnant. Talk to your healthcare professional if you are concerned about your fertility. ?This medicine has  caused decreased sperm counts in some men. This may make it more difficult to father a child. Talk to your healthcare professional if you are concerned about your fertility. ?Drink fluids as directed while you are taking this medicine. This will help protect your kidneys. ?Call your doctor or health care professional if you get diarrhea. Do not treat yourself. ?What side effects may I notice from receiving this medication? ?Side effects that you should report to your doctor or health care professional  as soon as possible: ?allergic reactions like skin rash, itching or hives, swelling of the face, lips, or tongue ?blurred vision ?changes in vision ?decreased hearing or ringing of the ears ?nausea, vomiting ?p

## 2022-04-04 ENCOUNTER — Other Ambulatory Visit: Payer: Self-pay

## 2022-04-04 ENCOUNTER — Emergency Department (HOSPITAL_COMMUNITY)
Admission: EM | Admit: 2022-04-04 | Discharge: 2022-04-04 | Disposition: A | Discharge status: Home / Self Care | Payer: BC Managed Care – PPO | Attending: Emergency Medicine | Admitting: Emergency Medicine

## 2022-04-04 DIAGNOSIS — G8929 Other chronic pain: Secondary | ICD-10-CM | POA: Insufficient documentation

## 2022-04-04 DIAGNOSIS — R1030 Lower abdominal pain, unspecified: Secondary | ICD-10-CM | POA: Diagnosis not present

## 2022-04-04 DIAGNOSIS — C540 Malignant neoplasm of isthmus uteri: Secondary | ICD-10-CM | POA: Insufficient documentation

## 2022-04-04 DIAGNOSIS — R21 Rash and other nonspecific skin eruption: Secondary | ICD-10-CM | POA: Diagnosis present

## 2022-04-04 MED ORDER — FAMOTIDINE 20 MG PO TABS
20.0000 mg | ORAL_TABLET | Freq: Once | ORAL | Status: AC
  Administered 2022-04-04: 20 mg via ORAL
  Filled 2022-04-04: qty 1

## 2022-04-04 MED ORDER — DIPHENHYDRAMINE HCL 25 MG PO CAPS
25.0000 mg | ORAL_CAPSULE | Freq: Once | ORAL | Status: AC
Start: 2022-04-04 — End: 2022-04-04
  Administered 2022-04-04: 25 mg via ORAL
  Filled 2022-04-04: qty 1

## 2022-04-04 MED ORDER — DIPHENHYDRAMINE HCL 25 MG PO TABS
25.0000 mg | ORAL_TABLET | Freq: Four times a day (QID) | ORAL | 0 refills | Status: DC | PRN
Start: 2022-04-04 — End: 2022-06-01

## 2022-04-04 MED ORDER — FAMOTIDINE 20 MG PO TABS: 20.0000 mg | ORAL_TABLET | Freq: Two times a day (BID) | ORAL | 0 refills | Status: AC

## 2022-04-04 MED ORDER — CEPHALEXIN 500 MG PO CAPS: 500.0000 mg | ORAL_CAPSULE | Freq: Four times a day (QID) | ORAL | 0 refills | Status: DC

## 2022-04-04 NOTE — ED Provider Triage Note (Signed)
Emergency Medicine Provider Triage Evaluation Note ? ?Elizabeth Bartlett , a 61 y.o. female  was evaluated in triage.  Pt complains of redness to neck and upper chest that she noticed this morning. First infusion of chemo yesterday. ?Port was placed on Monday of this week (5 days ago).  ? ?No symptoms currently.  ?No SOB, no NVD, fever, CP or skin pain ? ?Review of Systems  ?Positive: Rash ?Negative: Fever  ? ?Physical Exam  ?BP (!) 152/93 (BP Location: Right Arm)   Pulse 84   Temp 98.7 ?F (37.1 ?C) (Oral)   Resp 18   LMP 01/27/2013   SpO2 92%  ?Gen:   Awake, no distress   ?Resp:  Normal effort  ?MSK:   Moves extremities without difficulty  ?Other:  Redness of skin that blanches with touch to neck. No wheezing.  ? ?Medical Decision Making  ?Medically screening exam initiated at 3:30 PM.  Appropriate orders placed.  Eather Colas Stonebraker was informed that the remainder of the evaluation will be completed by another provider, this initial triage assessment does not replace that evaluation, and the importance of remaining in the ED until their evaluation is complete. ? ? ?  ?Tedd Sias, [place] ?04/04/22 1532 ? ?

## 2022-04-04 NOTE — ED Triage Notes (Signed)
Pt presents after having her first tx this week for uterine cancer.  Had port place this week. No problems after treatment but states today after taking tylenol for some lower abdominal pain (which has been baseline for her) she began to feel flushed and hot and had redness spreading around her port site and up to her neck and across chest.  Family called oncologist who recommended eval at the ED.  ?

## 2022-04-04 NOTE — ED Provider Notes (Signed)
?[facility] DEPT ?Provider Note ? ? ?CSN: [id number] ?Arrival date & time: 04/04/22  1453 ? ?  ? ?History ? ?Chief Complaint  ?Patient presents with  ? Allergic Reaction  ? ? ?Elizabeth Bartlett is a 61 y.o. female. ? ? ?Allergic Reaction ?Patient is a 61 year old female presented to the emergency room today with rash to her neck and chest.  She states that she noticed this rash earlier today she states that she would not notice it other than she felt somewhat flushed.  She denies any itching to her skin denies any warmth when she touches it and denies any pain.  She denies any difficulty breathing or swallowing.  She states that she had a port placed in her right chest for chemotherapy 5 days ago and had her first infusion of cisplatin, Decadron, magnesium, fosaprepitant.  ? ?She had no issues after infusion of medication yesterday.  She states that this morning she took a dose of Tylenol for some chronic pain that she suffers from chronically--lower abdominal pain which has been associated with her known uterine cancer. ? ?She has not taken any medications no Benadryl Pepcid or antihistamines and steroids.  She came to the ER because she called her oncology office and they recommended she come to have her rash evaluated. ? ?She specifically denies any symptoms currently although she did feel "flushed "earlier this morning ? ? ?  ? ?Home Medications ?Prior to Admission medications   ?Medication Sig Start Date End Date Taking? Authorizing Provider  ?diphenhydrAMINE (BENADRYL) 25 MG tablet Take 1 tablet (25 mg total) by mouth every 6 (six) hours as needed. 04/04/22  Yes Pati Gallo S, PA  ?famotidine (PEPCID) 20 MG tablet Take 1 tablet (20 mg total) by mouth 2 (two) times daily. 04/04/22  Yes Pati Gallo S, PA  ?acetaminophen (TYLENOL) 500 MG tablet Take 1,000 mg by mouth every 6 (six) hours as needed.    [provider]  ?atorvastatin (LIPITOR) 10 MG tablet Take 5 mg by mouth  daily. 01/27/22   [provider]  ?buPROPion (WELLBUTRIN SR) 150 MG 12 hr tablet Take 150 mg by mouth 2 (two) times daily. 01/27/22   [provider]  ?FLUoxetine (PROZAC) 40 MG capsule Take 40 mg by mouth daily.    [provider]  ?fluticasone (FLONASE) 50 MCG/ACT nasal spray Place 2 sprays into the nose daily as needed. Sinus Pressure    [provider]  ?levothyroxine (SYNTHROID, LEVOTHROID) 25 MCG tablet Take 25 mcg by mouth daily.    [provider]  ?lidocaine-prilocaine (EMLA) cream Apply to affected area once 03/25/22   Heath Lark, MD  ?meloxicam (MOBIC) 15 MG tablet Take 15 mg by mouth daily. 01/27/22   [provider]  ?ondansetron (ZOFRAN) 8 MG tablet Take 1 tablet (8 mg total) by mouth 2 (two) times daily as needed. Start on the third day after cisplatin chemotherapy. 03/25/22   Heath Lark, MD  ?prochlorperazine (COMPAZINE) 10 MG tablet Take 1 tablet (10 mg total) by mouth every 6 (six) hours as needed (Nausea or vomiting). 03/25/22   Heath Lark, MD  ?   ? ?Allergies    ?Patient has no known allergies.   ? ?Review of Systems   ?Review of Systems ? ?Physical Exam ?Updated Vital Signs ?BP (!) 152/93 (BP Location: Right Arm)   Pulse 84   Temp 98.7 ?F (37.1 ?C) (Oral)   Resp 18   LMP 01/27/2013   SpO2 92%  ?  Physical Exam ?Vitals and nursing note reviewed.  ?Constitutional:   ?   General: She is not in acute distress. ?HENT:  ?   Head: Normocephalic and atraumatic.  ?   Nose: Nose normal.  ?Eyes:  ?   General: No scleral icterus. ?Cardiovascular:  ?   Rate and Rhythm: Normal rate and regular rhythm.  ?   Pulses: Normal pulses.  ?   Heart sounds: Normal heart sounds.  ?Pulmonary:  ?   Effort: Pulmonary effort is normal. No respiratory distress.  ?   Breath sounds: No wheezing.  ?Abdominal:  ?   Palpations: Abdomen is soft.  ?   Tenderness: There is no abdominal tenderness.  ?Musculoskeletal:  ?   Cervical back: Normal range of motion.  ?   Right lower leg: No  edema.  ?   Left lower leg: No edema.  ?   Comments: No tenderness to the anterior neck or chest.  There is some bruising underneath the recently inserted port in her right chest.  There is no warmth to touch. ? ?No fluctuance warmth or tenderness.  ?Skin: ?   General: Skin is warm and dry.  ?   Capillary Refill: Capillary refill takes less than 2 seconds.  ?   Comments: Blanching erythematous rash to neck and chest see picture below this was outlined in skin marker  ?Neurological:  ?   Mental Status: She is alert. Mental status is at baseline.  ?Psychiatric:     ?   Mood and Affect: Mood normal.     ?   Behavior: Behavior normal.  ? ? ? ?ED Results / Procedures / Treatments   ?Labs ?(all labs ordered are listed, but only abnormal results are displayed) ?Labs Reviewed - No data to display ? ?EKG ?None ? ?Radiology ?No results found. ? ?Procedures ?Procedures  ? ? ?Medications Ordered in ED ?Medications  ?diphenhydrAMINE (BENADRYL) capsule 25 mg (25 mg Oral Given 04/04/22 1600)  ?famotidine (PEPCID) tablet 20 mg (20 mg Oral Given 04/04/22 1600)  ? ? ?ED Course/ Medical Decision Making/ A&P ?  ?                        ?Medical Decision Making ? ?Patient is a 61 year old female with a history of uterine cancer just started chemotherapy yesterday had an infusion of cisplatin medications listed in HPI.  She developed a rash this morning on her neck.  She had no rash yesterday she has not had any infectious symptoms such as fever, fatigue, malaise, she has not had any symptoms of indicate some swelling in her neck such as difficulty swallowing breathing or any subjective feeling of swelling. ? ?She is without any symptoms currently although she did feel symptoms of feeling flushed earlier this morning. ? ?Physical exam with rash to neck that is blanching not particularly warm is not cellulitic in appearance. ? ?Patient given Pepcid and Benadryl I reassessed patient and she seemed to have some improvement in the rash.  She  continues to be asymptomatic. ? ?Her and her family have been taking pictures and agree with me that her rash seems to have shrank.  ? ?We will provide her with a wait-and-see prescription for Keflex however recommended that she have any symptoms of fever malaise or fatigue or any other symptoms she should return to the ER immediately recommended that if the area spreads she should come back immediately to the ER as well.  Discharged home with  Benadryl Pepcid and very strict return precautions.  I doubt infection/abscess her symptoms came on very rapidly she may have had some delayed reaction to the cisplatin due to the Decadron she was administered.  In the setting of recent Decadron and chemo initiation I do not think lab work would be very helpful as she will likely have an elevated WBC due to steroid use. ? ?Patient states she continues to feel asymptomatic and feels that her rash is improved when showed pictures.  She is requesting discharge.  I discussed this case with my attending physician who is in agreement with my plan.  She will follow-up with her oncologist Monday/in 2 days unless any new or change in symptoms.  ? ?Final Clinical Impression(s) / ED Diagnoses ?Final diagnoses:  ?Rash  ? ? ?Rx / DC Orders ?ED Discharge Orders   ? ?      Ordered  ?  diphenhydrAMINE (BENADRYL) 25 MG tablet  Every 6 hours PRN       ? 04/04/22 1715  ?  famotidine (PEPCID) 20 MG tablet  2 times daily       ? 04/04/22 1715  ? ?  ?  ? ?  ? ? ?  ?Tedd Sias, [place] ?04/04/22 1802 ? ?  ?Regan Lemming, MD ?04/04/22 1958 ? ?

## 2022-04-04 NOTE — Discharge Instructions (Addendum)
We have drawn a marker around the area of redness to your neck.  Please keep a close eye on this and if it expands over the over the marker, if you develop any symptoms such as fever, difficulty breathing, fatigue, nausea, vomiting, any other new or concerning symptoms please return immediately to the emergency room. ? ?Otherwise keep taking Pepcid once daily, take Benadryl every 6-8 hours ? ?Please update your oncologist about this rash and discussed this with them at your appointment on Monday. ? ?I have also printed a prescription for keflex which is an antibiotic. If the rash spreads beyond the marker I recommend you begin taking this AND return to the ER (or see your oncologist if available).  ?

## 2022-04-06 ENCOUNTER — Inpatient Hospital Stay: Payer: BC Managed Care – PPO

## 2022-04-06 ENCOUNTER — Telehealth: Payer: Self-pay

## 2022-04-06 ENCOUNTER — Inpatient Hospital Stay (HOSPITAL_BASED_OUTPATIENT_CLINIC_OR_DEPARTMENT_OTHER): Payer: BC Managed Care – PPO | Admitting: Hematology and Oncology

## 2022-04-06 ENCOUNTER — Telehealth: Payer: Self-pay | Admitting: *Deleted

## 2022-04-06 ENCOUNTER — Other Ambulatory Visit: Payer: Self-pay

## 2022-04-06 ENCOUNTER — Ambulatory Visit
Admission: RE | Admit: 2022-04-06 | Discharge: 2022-04-06 | Disposition: A | Discharge status: Home / Self Care | Payer: BC Managed Care – PPO | Source: Ambulatory Visit | Attending: Radiation Oncology | Admitting: Radiation Oncology

## 2022-04-06 DIAGNOSIS — C539 Malignant neoplasm of cervix uteri, unspecified: Secondary | ICD-10-CM | POA: Diagnosis not present

## 2022-04-06 DIAGNOSIS — Z889 Allergy status to unspecified drugs, medicaments and biological substances status: Secondary | ICD-10-CM

## 2022-04-06 DIAGNOSIS — C530 Malignant neoplasm of endocervix: Secondary | ICD-10-CM

## 2022-04-06 DIAGNOSIS — R110 Nausea: Secondary | ICD-10-CM | POA: Diagnosis not present

## 2022-04-06 DIAGNOSIS — C50811 Malignant neoplasm of overlapping sites of right female breast: Secondary | ICD-10-CM

## 2022-04-06 DIAGNOSIS — C541 Malignant neoplasm of endometrium: Secondary | ICD-10-CM | POA: Diagnosis not present

## 2022-04-06 LAB — CBC WITH DIFFERENTIAL (CANCER CENTER ONLY)
Abs Immature Granulocytes: 0.04 10*3/uL (ref 0.00–0.07)
Basophils Absolute: 0.1 10*3/uL (ref 0.0–0.1)
Basophils Relative: 0 %
Eosinophils Absolute: 0 10*3/uL (ref 0.0–0.5)
Eosinophils Relative: 0 %
HCT: 43.9 % (ref 36.0–46.0)
Hemoglobin: 14.8 g/dL (ref 12.0–15.0)
Immature Granulocytes: 0 %
Lymphocytes Relative: 12 %
Lymphs Abs: 1.7 10*3/uL (ref 0.7–4.0)
MCH: 29.7 pg (ref 26.0–34.0)
MCHC: 33.7 g/dL (ref 30.0–36.0)
MCV: 88.2 fL (ref 80.0–100.0)
Monocytes Absolute: 0.9 10*3/uL (ref 0.1–1.0)
Monocytes Relative: 6 %
Neutro Abs: 11.7 10*3/uL — ABNORMAL HIGH (ref 1.7–7.7)
Neutrophils Relative %: 82 %
Platelet Count: 339 10*3/uL (ref 150–400)
RBC: 4.98 MIL/uL (ref 3.87–5.11)
RDW: 13.6 % (ref 11.5–15.5)
WBC Count: 14.5 10*3/uL — ABNORMAL HIGH (ref 4.0–10.5)
nRBC: 0 % (ref 0.0–0.2)

## 2022-04-06 LAB — COMPREHENSIVE METABOLIC PANEL
ALT: 131 U/L — ABNORMAL HIGH (ref 0–44)
AST: 30 U/L (ref 15–41)
Albumin: 4.5 g/dL (ref 3.5–5.0)
Alkaline Phosphatase: 226 U/L — ABNORMAL HIGH (ref 38–126)
Anion gap: 10 (ref 5–15)
BUN: 19 mg/dL (ref 8–23)
CO2: 28 mmol/L (ref 22–32)
Calcium: 9.7 mg/dL (ref 8.9–10.3)
Chloride: 97 mmol/L — ABNORMAL LOW (ref 98–111)
Creatinine, Ser: 1.03 mg/dL — ABNORMAL HIGH (ref 0.44–1.00)
GFR, Estimated: >60 mL/min (ref >60)
Glucose, Bld: 117 mg/dL — ABNORMAL HIGH (ref 70–99)
Potassium: 4.1 mmol/L (ref 3.5–5.1)
Sodium: 135 mmol/L (ref 135–145)
Total Bilirubin: 0.7 mg/dL (ref 0.3–1.2)
Total Protein: 7.6 g/dL (ref 6.5–8.1)

## 2022-04-06 LAB — MAGNESIUM: Magnesium: 2.3 mg/dL (ref 1.7–2.4)

## 2022-04-06 NOTE — Telephone Encounter (Signed)
Called and given below message. She verbalized understanding. 

## 2022-04-06 NOTE — Telephone Encounter (Signed)
Called pt to see how she did post chemo.  She reports feeling sick yesterday, nauseated & constipated.  She took her nausea med & small dose of miralax & went to bed.  Nausea med helped & she has had some relief from constipation.  She was having some pain L side on Sat & took tylenol.  She states this pain is the same that she has had since December-Menstrual like.  She was flushed in her face/neck & her family was concerned that it was the Tylenol & took her to ED.  Informed pt that this may have been from the Steroids she received with treatment.  She denied any itching or trouble breathing. She reports that she usually has diarrhea since gall bladder surgery in 87.  Suggested she may need a stool softener/Lax to start day before/of treatment to keep things moving & can always back off when constipation relieved.  Informed pt that I would discuss with Dr Alvy Bimler.  Pt will see Dr Alvy Bimler tomorrow.   ?

## 2022-04-06 NOTE — Telephone Encounter (Signed)
-----   Message from Clyda Hurdle, RN sent at 04/03/2022  2:36 PM EDT ----- ?Regarding: 1st time Cisplatin-Dr Alvy Bimler ?1st Time Cisplatin follow up- Dr Calton Dach patient ? ?

## 2022-04-06 NOTE — Telephone Encounter (Signed)
-----   Message from Heath Lark, MD sent at 04/06/2022  2:51 PM EDT ----- ?Her LFT is high, could be due to chemo ?I recommend stopping lipitor for 1 week until we recheck her labs next week ? ?

## 2022-04-06 NOTE — Telephone Encounter (Signed)
Called regarding ED visit and offered appt today with Dr. Alvy Bimler. Appt added and she is aware of appt time. ?

## 2022-04-07 ENCOUNTER — Encounter: Payer: Self-pay | Admitting: Hematology and Oncology

## 2022-04-07 ENCOUNTER — Other Ambulatory Visit: Payer: Self-pay

## 2022-04-07 ENCOUNTER — Ambulatory Visit
Admission: RE | Admit: 2022-04-07 | Discharge: 2022-04-07 | Disposition: A | Discharge status: Home / Self Care | Payer: BC Managed Care – PPO | Source: Ambulatory Visit | Attending: Radiation Oncology | Admitting: Radiation Oncology

## 2022-04-07 ENCOUNTER — Inpatient Hospital Stay: Payer: BC Managed Care – PPO | Admitting: Hematology and Oncology

## 2022-04-07 DIAGNOSIS — Z889 Allergy status to unspecified drugs, medicaments and biological substances status: Secondary | ICD-10-CM | POA: Insufficient documentation

## 2022-04-07 DIAGNOSIS — C541 Malignant neoplasm of endometrium: Secondary | ICD-10-CM | POA: Diagnosis not present

## 2022-04-07 DIAGNOSIS — R110 Nausea: Secondary | ICD-10-CM | POA: Insufficient documentation

## 2022-04-07 LAB — RAD ONC ARIA SESSION SUMMARY
Course Elapsed Days: 1
Plan Fractions Treated to Date: 2
Plan Prescribed Dose Per Fraction: 1.8 Gy
Plan Total Fractions Prescribed: 25
Plan Total Prescribed Dose: 45 Gy
Reference Point Dosage Given to Date: 3.6 Gy
Reference Point Session Dosage Given: 1.8 Gy
Session Number: 2

## 2022-04-07 NOTE — Assessment & Plan Note (Signed)
She tolerated treatment well except for mild nausea and reaction at the site of port placement ?Her port does not look infected ?She is reassured ?She will continue her treatment with radiation as directed ?

## 2022-04-07 NOTE — Assessment & Plan Note (Signed)
She has erythematous changes/hives at the port insertion site ?I suspect she might be reacting to the glue used for port placement ?We will put special instruction on her chart to use sensitive dressing only in the future over her port ?I recommend blood draw from her arm instead of from the port just for this week to allow further recovery ?

## 2022-04-07 NOTE — Assessment & Plan Note (Signed)
She is directed to take antiemetics as needed ?

## 2022-04-07 NOTE — Progress Notes (Signed)
[facility] ?OFFICE PROGRESS NOTE ? ?Patient Care Team: ?Sasser, Paul W, MD as PCP - General (Cardiology) ? ?ASSESSMENT & PLAN:  ?Uterine cancer (HCC) ?She tolerated treatment well except for mild nausea and reaction at the site of port placement ?Her port does not look infected ?She is reassured ?She will continue her treatment with radiation as directed ? ?Allergy to drug ?She has erythematous changes/hives at the port insertion site ?I suspect she might be reacting to the glue used for port placement ?We will put special instruction on her chart to use sensitive dressing only in the future over her port ?I recommend blood draw from her arm instead of from the port just for this week to allow further recovery ? ?Nausea without vomiting ?She is directed to take antiemetics as needed ? ?No orders of the defined types were placed in this encounter. ? ? ?All questions were answered. The patient knows to call the clinic with any problems, questions or concerns. ?The total time spent in the appointment was 20 minutes encounter with patients including review of chart and various tests results, discussions about plan of care and coordination of care plan ?  ?Ni Gorsuch, MD ?04/07/2022 9:09 AM ? ?INTERVAL HISTORY: ?Please see below for problem oriented charting. ?she returns for evaluation due to concern for port infection ?She went to the ER recently and was evaluated ?She was given some antihistamines with improvement ?She denies fever or chills ?She had mild nausea with recent chemo but that has resolved ? ?REVIEW OF SYSTEMS:   ?Constitutional: Denies fevers, chills or abnormal weight loss ?Eyes: Denies blurriness of vision ?Ears, nose, mouth, throat, and face: Denies mucositis or sore throat ?Respiratory: Denies cough, dyspnea or wheezes ?Cardiovascular: Denies palpitation, chest discomfort or lower extremity swelling ?Gastrointestinal:  Denies nausea, heartburn or change in bowel habits ?Lymphatics:  Denies new lymphadenopathy or easy bruising ?Neurological:Denies numbness, tingling or new weaknesses ?Behavioral/Psych: Mood is stable, no new changes  ?All other systems were reviewed with the patient and are negative. ? ?I have reviewed the past medical history, past surgical history, social history and family history with the patient and they are unchanged from previous note. ? ?ALLERGIES:  has No Known Allergies. ? ?MEDICATIONS:  ?Current Outpatient Medications  ?Medication Sig Dispense Refill  ? acetaminophen (TYLENOL) 500 MG tablet Take 1,000 mg by mouth every 6 (six) hours as needed.    ? atorvastatin (LIPITOR) 10 MG tablet Take 5 mg by mouth daily.    ? buPROPion (WELLBUTRIN SR) 150 MG 12 hr tablet Take 150 mg by mouth 2 (two) times daily.    ? diphenhydrAMINE (BENADRYL) 25 MG tablet Take 1 tablet (25 mg total) by mouth every 6 (six) hours as needed. 30 tablet 0  ? famotidine (PEPCID) 20 MG tablet Take 1 tablet (20 mg total) by mouth 2 (two) times daily. 30 tablet 0  ? FLUoxetine (PROZAC) 40 MG capsule Take 40 mg by mouth daily.    ? fluticasone (FLONASE) 50 MCG/ACT nasal spray Place 2 sprays into the nose daily as needed. Sinus Pressure    ? levothyroxine (SYNTHROID, LEVOTHROID) 25 MCG tablet Take 25 mcg by mouth daily.    ? lidocaine-prilocaine (EMLA) cream Apply to affected area once 30 g 3  ? meloxicam (MOBIC) 15 MG tablet Take 15 mg by mouth daily.    ? ondansetron (ZOFRAN) 8 MG tablet Take 1 tablet (8 mg total) by mouth 2 (two) times daily as needed. Start on the third   day after cisplatin chemotherapy. 30 tablet 1  ? prochlorperazine (COMPAZINE) 10 MG tablet Take 1 tablet (10 mg total) by mouth every 6 (six) hours as needed (Nausea or vomiting). 30 tablet 1  ? ?No current facility-administered medications for this visit.  ? ? 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  ?Uterine cancer [facility])  ?01/20/2013  Pathology Results  ? Endometrium, biopsy ?- POLYPOID DEGENERATING SECRETORY-TYPE ENDOMETRIUM, SEE COMMENT ?  ?01/31/2013 Surgery  ? She underwent supracervical hysterectomy and bilateral salpingo-oophorectomy.  Uterus was ultimately morcellated for removal. ?  ?01/31/2013 Pathology Results  ? Uterus and bilateral fallopian tubes ?- ENDOMETRIUM: PROLIFERATIVE WITH BENIGN ENDOMETRIAL POLYP. NO HYPERPLASIA OR ?CARCINOMA. ?- MYOMETRIUM: ADENOMYOSIS. LEIOMYOMA. ?- UTERINE SEROSA: UNREMARKABLE. NO ENDOMETRIOSIS OR MALIGNANCY. ?- BILATERAL FALLOPIAN TUBES: UNREMARKABLE. ?  ?12/15/2021 Initial Diagnosis  ? The patient experienced postmenopausal bleeding. ?  ?01/30/2022 Imaging  ? Pelvic ultrasound exam was performed on 2/10 showing 18 x 18 x 16 cm masslike area within the cervix containing internal blood flow, question cervical fibroids or mass/neoplasm.   ?  ?01/30/2022 Pathology Results  ? 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  ?03/10/2022 PET scan  ? Hypermetabolic activity in uterine body consistent with primary uterine carcinoma ?  ?No evidence metastatic disease outside of the uterus ?  ?03/16/2022 Imaging  ? MRI pelvis ?3.6 x 2.8 x 2.9 cm cervical mass replaces and peripherally compresses normal cervical stroma stroma. There is marked thinning of the normal stroma posterolaterally on the right without definite parametrial extension on today's study. No involvement of the rectum. Assessment of the upper posterior bladder wall is limited by surgical scarring and susceptibility artifact from adjacent surgical clips in this region. Within this limitation, no focal bladder wall thickening or abnormal bladder wall enhancement is appreciated. Tumor extends posteriorly to the level of the external os, but no definite vaginal wall involvement appreciable by MRI. ?  ?No evidence  for pelvic sidewall lymphadenopathy. ?  ?  ?03/25/2022 Cancer Staging  ? Staging form: Corpus Uteri - Carcinoma and Carcinosarcoma, AJCC 8th Edition ?- Clinical stage from 03/25/2022: Stage II (cT2, cN0, cM0) - Signed by Heath Lark, MD on 03/25/2022 ?Stage prefix: Initial diagnosis ? ?  ?03/31/2022 Procedure  ? Successful placement of a right internal jugular approach power injectable Port-A-Cath. The catheter is ready for immediate use. ?  ?  ?04/03/2022 -  Chemotherapy  ? Patient is on Treatment Plan : Uterine Cisplatin days 1 and days 29 with radiation  ? ?  ?  ? ? ?PHYSICAL EXAMINATION: ?ECOG PERFORMANCE STATUS: 1 - Symptomatic but completely ambulatory ? ?Vitals:  ? 04/06/22 1241  ?BP: (!) 142/72  ?Pulse: 97  ?Resp: 18  ?Temp: (!) 97.5 ?F (36.4 ?C)  ?SpO2: 98%  ? ?Filed Weights  ? 04/06/22 1241  ?Weight: 163 lb 6.4 oz (74.1 kg)  ? ? ?GENERAL:alert, no distress and comfortable ?SKIN: No other erythematous changes over her port site not consistent with infection ?NEURO: alert & oriented x 3 with fluent speech, no focal motor/sensory deficits ? ?LABORATORY DATA:  ?I have reviewed the data as listed ?   ?Component Value Date/Time  ? NA 135 04/06/2022 1340  ? K 4.1 04/06/2022 1340  ? CL 97 (L) 04/06/2022 1340  ? CO2 28 04/06/2022 1340  ? GLUCOSE 117 (H) 04/06/2022  1340  ? BUN 19 04/06/2022 1340  ? CREATININE 1.03 (H) 04/06/2022 1340  ? CALCIUM 9.7 04/06/2022 1340  ? PROT 7.6 04/06/2022 1340  ? ALBUMIN 4.5 04/06/2022 1340  ? AST 30 04/06/2022 1340  ? ALT 131 (H) 04/06/2022 1340  ? ALKPHOS 226 (H) 04/06/2022 1340  ? BILITOT 0.7 04/06/2022 1340  ? GFRNONAA >60 04/06/2022 1340  ? GFRAA 75 (L) 01/27/2013 1521  ? ? ?No results found for: SPEP, UPEP ? ?Lab Results  ?Component Value Date  ? WBC 14.5 (H) 04/06/2022  ? NEUTROABS 11.7 (H) 04/06/2022  ? HGB 14.8 04/06/2022  ? HCT 43.9 04/06/2022  ? MCV 88.2 04/06/2022  ? PLT 339 04/06/2022  ? ? ?  Chemistry   ?   ?Component Value Date/Time  ? NA 135 04/06/2022 1340  ? K 4.1 04/06/2022  1340  ? CL 97 (L) 04/06/2022 1340  ? CO2 28 04/06/2022 1340  ? BUN 19 04/06/2022 1340  ? CREATININE 1.03 (H) 04/06/2022 1340  ?    ?Component Value Date/Time  ? CALCIUM 9.7 04/06/2022 1340  ? ALKPHOS 226 (H

## 2022-04-08 ENCOUNTER — Other Ambulatory Visit: Payer: Self-pay

## 2022-04-08 ENCOUNTER — Ambulatory Visit
Admission: RE | Admit: 2022-04-08 | Discharge: 2022-04-08 | Disposition: A | Discharge status: Home / Self Care | Payer: BC Managed Care – PPO | Source: Ambulatory Visit | Attending: Radiation Oncology | Admitting: Radiation Oncology

## 2022-04-08 DIAGNOSIS — C541 Malignant neoplasm of endometrium: Secondary | ICD-10-CM | POA: Diagnosis not present

## 2022-04-08 LAB — RAD ONC ARIA SESSION SUMMARY
Course Elapsed Days: 2
Plan Fractions Treated to Date: 3
Plan Prescribed Dose Per Fraction: 1.8 Gy
Plan Total Fractions Prescribed: 25
Plan Total Prescribed Dose: 45 Gy
Reference Point Dosage Given to Date: 5.4 Gy
Reference Point Session Dosage Given: 0.5783 Gy
Session Number: 3

## 2022-04-09 ENCOUNTER — Ambulatory Visit
Admission: RE | Admit: 2022-04-09 | Discharge: 2022-04-09 | Disposition: A | Discharge status: Home / Self Care | Payer: BC Managed Care – PPO | Source: Ambulatory Visit | Attending: Radiation Oncology | Admitting: Radiation Oncology

## 2022-04-09 ENCOUNTER — Other Ambulatory Visit: Payer: Self-pay

## 2022-04-09 DIAGNOSIS — C541 Malignant neoplasm of endometrium: Secondary | ICD-10-CM | POA: Diagnosis not present

## 2022-04-09 LAB — RAD ONC ARIA SESSION SUMMARY
Course Elapsed Days: 3
Plan Fractions Treated to Date: 4
Plan Prescribed Dose Per Fraction: 1.8 Gy
Plan Total Fractions Prescribed: 25
Plan Total Prescribed Dose: 45 Gy
Reference Point Dosage Given to Date: 7.2 Gy
Reference Point Session Dosage Given: 1.8 Gy
Session Number: 4

## 2022-04-10 ENCOUNTER — Ambulatory Visit
Admission: RE | Admit: 2022-04-10 | Discharge: 2022-04-10 | Disposition: A | Discharge status: Home / Self Care | Payer: BC Managed Care – PPO | Source: Ambulatory Visit | Attending: Radiation Oncology | Admitting: Radiation Oncology

## 2022-04-10 ENCOUNTER — Other Ambulatory Visit: Payer: Self-pay

## 2022-04-10 DIAGNOSIS — C541 Malignant neoplasm of endometrium: Secondary | ICD-10-CM | POA: Diagnosis not present

## 2022-04-10 LAB — RAD ONC ARIA SESSION SUMMARY
Course Elapsed Days: 4
Plan Fractions Treated to Date: 5
Plan Prescribed Dose Per Fraction: 1.8 Gy
Plan Total Fractions Prescribed: 25
Plan Total Prescribed Dose: 45 Gy
Reference Point Dosage Given to Date: 9 Gy
Reference Point Session Dosage Given: 1.8 Gy
Session Number: 5

## 2022-04-13 ENCOUNTER — Ambulatory Visit
Admission: RE | Admit: 2022-04-13 | Discharge: 2022-04-13 | Disposition: A | Discharge status: Home / Self Care | Payer: BC Managed Care – PPO | Source: Ambulatory Visit | Attending: Radiation Oncology | Admitting: Radiation Oncology

## 2022-04-13 ENCOUNTER — Other Ambulatory Visit: Payer: Self-pay

## 2022-04-13 ENCOUNTER — Inpatient Hospital Stay: Payer: BC Managed Care – PPO

## 2022-04-13 DIAGNOSIS — C50811 Malignant neoplasm of overlapping sites of right female breast: Secondary | ICD-10-CM

## 2022-04-13 DIAGNOSIS — C530 Malignant neoplasm of endocervix: Secondary | ICD-10-CM

## 2022-04-13 DIAGNOSIS — C541 Malignant neoplasm of endometrium: Secondary | ICD-10-CM | POA: Diagnosis not present

## 2022-04-13 LAB — COMPREHENSIVE METABOLIC PANEL
ALT: 44 U/L (ref 0–44)
AST: 18 U/L (ref 15–41)
Albumin: 4 g/dL (ref 3.5–5.0)
Alkaline Phosphatase: 167 U/L — ABNORMAL HIGH (ref 38–126)
Anion gap: 8 (ref 5–15)
BUN: 13 mg/dL (ref 8–23)
CO2: 28 mmol/L (ref 22–32)
Calcium: 8.9 mg/dL (ref 8.9–10.3)
Chloride: 104 mmol/L (ref 98–111)
Creatinine, Ser: 0.73 mg/dL (ref 0.44–1.00)
GFR, Estimated: >60 mL/min (ref >60)
Glucose, Bld: 101 mg/dL — ABNORMAL HIGH (ref 70–99)
Potassium: 3.9 mmol/L (ref 3.5–5.1)
Sodium: 140 mmol/L (ref 135–145)
Total Bilirubin: 0.4 mg/dL (ref 0.3–1.2)
Total Protein: 6.6 g/dL (ref 6.5–8.1)

## 2022-04-13 LAB — CBC WITH DIFFERENTIAL (CANCER CENTER ONLY)
Abs Immature Granulocytes: 0.03 10*3/uL (ref 0.00–0.07)
Basophils Absolute: 0 10*3/uL (ref 0.0–0.1)
Basophils Relative: 1 %
Eosinophils Absolute: 0.2 10*3/uL (ref 0.0–0.5)
Eosinophils Relative: 3 %
HCT: 37.8 % (ref 36.0–46.0)
Hemoglobin: 12.4 g/dL (ref 12.0–15.0)
Immature Granulocytes: 0 %
Lymphocytes Relative: 9 %
Lymphs Abs: 0.7 10*3/uL (ref 0.7–4.0)
MCH: 29.2 pg (ref 26.0–34.0)
MCHC: 32.8 g/dL (ref 30.0–36.0)
MCV: 89.2 fL (ref 80.0–100.0)
Monocytes Absolute: 0.4 10*3/uL (ref 0.1–1.0)
Monocytes Relative: 5 %
Neutro Abs: 5.8 10*3/uL (ref 1.7–7.7)
Neutrophils Relative %: 82 %
Platelet Count: 297 10*3/uL (ref 150–400)
RBC: 4.24 MIL/uL (ref 3.87–5.11)
RDW: 13.2 % (ref 11.5–15.5)
WBC Count: 7.1 10*3/uL (ref 4.0–10.5)
nRBC: 0 % (ref 0.0–0.2)

## 2022-04-13 LAB — RAD ONC ARIA SESSION SUMMARY
Course Elapsed Days: 7
Plan Fractions Treated to Date: 6
Plan Prescribed Dose Per Fraction: 1.8 Gy
Plan Total Fractions Prescribed: 25
Plan Total Prescribed Dose: 45 Gy
Reference Point Dosage Given to Date: 10.8 Gy
Reference Point Session Dosage Given: 1.8 Gy
Session Number: 6

## 2022-04-13 LAB — MAGNESIUM: Magnesium: 2 mg/dL (ref 1.7–2.4)

## 2022-04-13 MED ORDER — HEPARIN SOD (PORK) LOCK FLUSH 100 UNIT/ML IV SOLN: 500.0000 [IU] | Freq: Once | INTRAVENOUS | Status: DC

## 2022-04-13 MED ORDER — SODIUM CHLORIDE 0.9% FLUSH
10.0000 mL | Freq: Once | INTRAVENOUS | Status: AC
  Administered 2022-04-13: 10 mL

## 2022-04-14 ENCOUNTER — Encounter: Payer: Self-pay | Admitting: Hematology and Oncology

## 2022-04-14 ENCOUNTER — Ambulatory Visit
Admission: RE | Admit: 2022-04-14 | Discharge: 2022-04-14 | Disposition: A | Discharge status: Home / Self Care | Payer: BC Managed Care – PPO | Source: Ambulatory Visit | Attending: Radiation Oncology | Admitting: Radiation Oncology

## 2022-04-14 ENCOUNTER — Inpatient Hospital Stay: Payer: BC Managed Care – PPO | Admitting: Hematology and Oncology

## 2022-04-14 ENCOUNTER — Other Ambulatory Visit: Payer: Self-pay

## 2022-04-14 DIAGNOSIS — C539 Malignant neoplasm of cervix uteri, unspecified: Secondary | ICD-10-CM | POA: Diagnosis not present

## 2022-04-14 DIAGNOSIS — C541 Malignant neoplasm of endometrium: Secondary | ICD-10-CM | POA: Diagnosis not present

## 2022-04-14 LAB — RAD ONC ARIA SESSION SUMMARY
Course Elapsed Days: 8
Plan Fractions Treated to Date: 7
Plan Prescribed Dose Per Fraction: 1.8 Gy
Plan Total Fractions Prescribed: 25
Plan Total Prescribed Dose: 45 Gy
Reference Point Dosage Given to Date: 12.6 Gy
Reference Point Session Dosage Given: 1.8 Gy
Session Number: 7

## 2022-04-14 NOTE — Progress Notes (Signed)
[place] ?OFFICE PROGRESS NOTE ? ?Patient Care Team: ?[name], MD as PCP - General (Cardiology) ? ?ASSESSMENT & PLAN:  ?Uterine cancer ([place]) ?Overall, she tolerated recent treatment well ?She have less cramping and only occasional rare vaginal spotting ?Her blood work looks excellent ?Due to excellent performance status and relative lack of symptoms, I will cancel her appointment next week ?She will continue radiation therapy ?I will see her again before her next dose of treatment in 2 weeks ? ?No orders of the defined types were placed in this encounter. ? ? ?All questions were answered. The patient knows to call the clinic with any problems, questions or concerns. ?The total time spent in the appointment was 20 minutes encounter with patients including review of chart and various tests results, discussions about plan of care and coordination of care plan ?  ?Heath Lark, MD ?04/14/2022 10:59 AM ? ?INTERVAL HISTORY: ?Please see below for problem oriented charting. ?she returns for treatment follow-up ?She is here accompanied by her husband ?She is doing well ?She have less pelvic cramping and spotting ?The skin rash around her port site has resolved ?She have no other new symptoms ? ?REVIEW OF SYSTEMS:   ?Constitutional: Denies fevers, chills or abnormal weight loss ?Eyes: Denies blurriness of vision ?Ears, nose, mouth, throat, and face: Denies mucositis or sore throat ?Respiratory: Denies cough, dyspnea or wheezes ?Cardiovascular: Denies palpitation, chest discomfort or lower extremity swelling ?Gastrointestinal:  Denies nausea, heartburn or change in bowel habits ?Skin: Denies abnormal skin rashes ?Lymphatics: Denies new lymphadenopathy or easy bruising ?Neurological:Denies numbness, tingling or new weaknesses ?Behavioral/Psych: Mood is stable, no new changes  ?All other systems were reviewed with the patient and are negative. ? ?I have reviewed the past medical history, past surgical  history, social history and family history with the patient and they are unchanged from previous note. ? ?ALLERGIES:  has No Known Allergies. ? ?MEDICATIONS:  ?Current Outpatient Medications  ?Medication Sig Dispense Refill  ? acetaminophen (TYLENOL) 500 MG tablet Take 1,000 mg by mouth every 6 (six) hours as needed.    ? atorvastatin (LIPITOR) 10 MG tablet Take 5 mg by mouth daily.    ? buPROPion (WELLBUTRIN SR) 150 MG 12 hr tablet Take 150 mg by mouth 2 (two) times daily.    ? diphenhydrAMINE (BENADRYL) 25 MG tablet Take 1 tablet (25 mg total) by mouth every 6 (six) hours as needed. 30 tablet 0  ? famotidine (PEPCID) 20 MG tablet Take 1 tablet (20 mg total) by mouth 2 (two) times daily. 30 tablet 0  ? FLUoxetine (PROZAC) 40 MG capsule Take 40 mg by mouth daily.    ? fluticasone (FLONASE) 50 MCG/ACT nasal spray Place 2 sprays into the nose daily as needed. Sinus Pressure    ? levothyroxine (SYNTHROID, LEVOTHROID) 25 MCG tablet Take 25 mcg by mouth daily.    ? lidocaine-prilocaine (EMLA) cream Apply to affected area once 30 g 3  ? meloxicam (MOBIC) 15 MG tablet Take 15 mg by mouth daily.    ? ondansetron (ZOFRAN) 8 MG tablet Take 1 tablet (8 mg total) by mouth 2 (two) times daily as needed. Start on the third day after cisplatin chemotherapy. 30 tablet 1  ? prochlorperazine (COMPAZINE) 10 MG tablet Take 1 tablet (10 mg total) by mouth every 6 (six) hours as needed (Nausea or vomiting). 30 tablet 1  ? ?No current facility-administered medications for this visit.  ? ? 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  ?Uterine cancer [facility])  ?01/20/2013 Pathology Results  ? Endometrium, biopsy ?- POLYPOID DEGENERATING SECRETORY-TYPE ENDOMETRIUM, SEE COMMENT ?  ?01/31/2013 Surgery  ? She underwent supracervical hysterectomy and bilateral salpingo-oophorectomy.  Uterus was ultimately morcellated for removal. ?  ?01/31/2013 Pathology  Results  ? Uterus and bilateral fallopian tubes ?- ENDOMETRIUM: PROLIFERATIVE WITH BENIGN ENDOMETRIAL POLYP. NO HYPERPLASIA OR ?CARCINOMA. ?- MYOMETRIUM: ADENOMYOSIS. LEIOMYOMA. ?- UTERINE SEROSA: UNREMARKABLE. NO ENDOMETRIOSIS OR MALIGNANCY. ?- BILATERAL FALLOPIAN TUBES: UNREMARKABLE. ?  ?12/15/2021 Initial Diagnosis  ? The patient experienced postmenopausal bleeding. ?  ?01/30/2022 Imaging  ? Pelvic ultrasound exam was performed on 2/10 showing 18 x 18 x 16 cm masslike area within the cervix containing internal blood flow, question cervical fibroids or mass/neoplasm.   ?  ?01/30/2022 Pathology Results  ? 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  ?03/10/2022 PET scan  ? Hypermetabolic activity in uterine body consistent with primary uterine carcinoma ?  ?No evidence metastatic disease outside of the uterus ?  ?03/16/2022 Imaging  ? MRI pelvis ?3.6 x 2.8 x 2.9 cm cervical mass replaces and peripherally compresses normal cervical stroma stroma. There is marked thinning of the normal stroma posterolaterally on the right without definite parametrial extension on today's study. No involvement of the rectum. Assessment of the upper posterior bladder wall is limited by surgical scarring and susceptibility artifact from adjacent surgical clips in this region. Within this limitation, no focal bladder wall thickening or abnormal bladder wall enhancement is appreciated. Tumor extends posteriorly to the level of the external os, but no definite vaginal wall involvement appreciable by MRI. ?  ?No evidence for pelvic sidewall lymphadenopathy. ?  ?  ?03/25/2022 Cancer Staging  ? Staging form: Corpus Uteri - Carcinoma and Carcinosarcoma, AJCC 8th Edition ?- Clinical stage from 03/25/2022: Stage II (cT2, cN0, cM0) - Signed by Heath Lark, MD on 03/25/2022 ?Stage prefix: Initial  diagnosis ? ?  ?03/31/2022 Procedure  ? Successful placement of a right internal jugular approach power injectable Port-A-Cath. The catheter is ready for immediate use. ?  ?  ?04/03/2022 -  Chemotherapy  ? Patient is on Treatment Plan : Uterine Cisplatin days 1 and days 29 with radiation  ? ?  ?  ? ? ?PHYSICAL EXAMINATION: ?ECOG PERFORMANCE STATUS: 1 - Symptomatic but completely ambulatory ? ?Vitals:  ? 04/14/22 1008  ?BP: 139/79  ?Pulse: 85  ?Resp: 18  ?Temp: 98 ?F (36.7 ?C)  ?SpO2: 98%  ? ?Filed Weights  ? 04/14/22 1008  ?Weight: 166 lb 3.2 oz (75.4 kg)  ? ? ?GENERAL:alert, no distress and comfortable ? ?NEURO: alert & oriented x 3 with fluent speech, no focal motor/sensory deficits ? ?LABORATORY DATA:  ?I have reviewed the data as listed ?   ?Component Value Date/Time  ? NA 140 04/13/2022 0935  ? K 3.9 04/13/2022 0935  ? CL 104 04/13/2022 0935  ? CO2 28 04/13/2022 0935  ? GLUCOSE 101 (H) 04/13/2022 0935  ? BUN 13 04/13/2022 0935  ? CREATININE 0.73 04/13/2022 0935  ? CALCIUM 8.9 04/13/2022 0935  ? PROT 6.6 04/13/2022 0935  ? ALBUMIN 4.0 04/13/2022 0935  ? AST 18 04/13/2022 0935  ? ALT 44 04/13/2022 0935  ? ALKPHOS 167 (H) 04/13/2022 0935  ? BILITOT 0.4 04/13/2022 0935  ? GFRNONAA >60 04/13/2022 0935  ? GFRAA 75 (L) 01/27/2013  1521  ? ? ?No results found for: SPEP, UPEP ? ?Lab Results  ?Component Value Date  ? WBC 7.1 04/13/2022  ? NEUTROABS 5.8 04/13/2022  ? HGB 12.4 04/13/2022  ? HCT 37.8 04/13/2022  ? MCV 89.2 04/13/2022  ? PLT 297 04/13/2022  ? ? ?  Chemistry   ?   ?Component Value Date/Time  ? NA 140 04/13/2022 0935  ? K 3.9 04/13/2022 0935  ? CL 104 04/13/2022 0935  ? CO2 28 04/13/2022 0935  ? BUN 13 04/13/2022 0935  ? CREATININE 0.73 04/13/2022 0935  ?    ?Component Value Date/Time  ? CALCIUM 8.9 04/13/2022 0935  ? ALKPHOS 167 (H) 04/13/2022 0935  ? AST 18 04/13/2022 0935  ? ALT 44 04/13/2022 0935  ? BILITOT 0.4 04/13/2022 0935  ?  ? ? ? ?

## 2022-04-14 NOTE — Assessment & Plan Note (Signed)
Overall, she tolerated recent treatment well ?She have less cramping and only occasional rare vaginal spotting ?Her blood work looks excellent ?Due to excellent performance status and relative lack of symptoms, I will cancel her appointment next week ?She will continue radiation therapy ?I will see her again before her next dose of treatment in 2 weeks ?

## 2022-04-15 ENCOUNTER — Other Ambulatory Visit: Payer: Self-pay

## 2022-04-15 ENCOUNTER — Ambulatory Visit
Admission: RE | Admit: 2022-04-15 | Discharge: 2022-04-15 | Disposition: A | Discharge status: Home / Self Care | Payer: BC Managed Care – PPO | Source: Ambulatory Visit | Attending: Radiation Oncology | Admitting: Radiation Oncology

## 2022-04-15 DIAGNOSIS — C541 Malignant neoplasm of endometrium: Secondary | ICD-10-CM | POA: Diagnosis not present

## 2022-04-15 LAB — RAD ONC ARIA SESSION SUMMARY
Course Elapsed Days: 9
Plan Fractions Treated to Date: 8
Plan Prescribed Dose Per Fraction: 1.8 Gy
Plan Total Fractions Prescribed: 25
Plan Total Prescribed Dose: 45 Gy
Reference Point Dosage Given to Date: 14.4 Gy
Reference Point Session Dosage Given: 1.8 Gy
Session Number: 8

## 2022-04-16 ENCOUNTER — Other Ambulatory Visit: Payer: Self-pay

## 2022-04-16 ENCOUNTER — Ambulatory Visit
Admission: RE | Admit: 2022-04-16 | Discharge: 2022-04-16 | Disposition: A | Discharge status: Home / Self Care | Payer: BC Managed Care – PPO | Source: Ambulatory Visit | Attending: Radiation Oncology | Admitting: Radiation Oncology

## 2022-04-16 DIAGNOSIS — C541 Malignant neoplasm of endometrium: Secondary | ICD-10-CM | POA: Diagnosis not present

## 2022-04-16 LAB — RAD ONC ARIA SESSION SUMMARY
Course Elapsed Days: 10
Plan Fractions Treated to Date: 9
Plan Prescribed Dose Per Fraction: 1.8 Gy
Plan Total Fractions Prescribed: 25
Plan Total Prescribed Dose: 45 Gy
Reference Point Dosage Given to Date: 16.2 Gy
Reference Point Session Dosage Given: 1.8 Gy
Session Number: 9

## 2022-04-17 ENCOUNTER — Other Ambulatory Visit: Payer: Self-pay

## 2022-04-17 ENCOUNTER — Ambulatory Visit
Admission: RE | Admit: 2022-04-17 | Discharge: 2022-04-17 | Disposition: A | Discharge status: Home / Self Care | Payer: BC Managed Care – PPO | Source: Ambulatory Visit | Attending: Radiation Oncology | Admitting: Radiation Oncology

## 2022-04-17 DIAGNOSIS — C541 Malignant neoplasm of endometrium: Secondary | ICD-10-CM | POA: Diagnosis not present

## 2022-04-17 LAB — RAD ONC ARIA SESSION SUMMARY
Course Elapsed Days: 11
Plan Fractions Treated to Date: 10
Plan Prescribed Dose Per Fraction: 1.8 Gy
Plan Total Fractions Prescribed: 25
Plan Total Prescribed Dose: 45 Gy
Reference Point Dosage Given to Date: 18 Gy
Reference Point Session Dosage Given: 1.8 Gy
Session Number: 10

## 2022-04-20 ENCOUNTER — Inpatient Hospital Stay: Payer: BC Managed Care – PPO

## 2022-04-20 ENCOUNTER — Other Ambulatory Visit: Payer: Self-pay

## 2022-04-20 ENCOUNTER — Ambulatory Visit
Admission: RE | Admit: 2022-04-20 | Discharge: 2022-04-20 | Disposition: A | Discharge status: Home / Self Care | Payer: BC Managed Care – PPO | Source: Ambulatory Visit | Attending: Radiation Oncology | Admitting: Radiation Oncology

## 2022-04-20 DIAGNOSIS — C539 Malignant neoplasm of cervix uteri, unspecified: Secondary | ICD-10-CM | POA: Insufficient documentation

## 2022-04-20 LAB — RAD ONC ARIA SESSION SUMMARY
Course Elapsed Days: 14
Plan Fractions Treated to Date: 11
Plan Prescribed Dose Per Fraction: 1.8 Gy
Plan Total Fractions Prescribed: 25
Plan Total Prescribed Dose: 45 Gy
Reference Point Dosage Given to Date: 19.8 Gy
Reference Point Session Dosage Given: 1.8 Gy
Session Number: 11

## 2022-04-21 ENCOUNTER — Other Ambulatory Visit: Payer: Self-pay

## 2022-04-21 ENCOUNTER — Ambulatory Visit
Admission: RE | Admit: 2022-04-21 | Discharge: 2022-04-21 | Disposition: A | Discharge status: Home / Self Care | Payer: BC Managed Care – PPO | Source: Ambulatory Visit | Attending: Radiation Oncology | Admitting: Radiation Oncology

## 2022-04-21 ENCOUNTER — Inpatient Hospital Stay: Payer: BC Managed Care – PPO | Admitting: Hematology and Oncology

## 2022-04-21 DIAGNOSIS — C539 Malignant neoplasm of cervix uteri, unspecified: Secondary | ICD-10-CM | POA: Diagnosis not present

## 2022-04-21 LAB — RAD ONC ARIA SESSION SUMMARY
Course Elapsed Days: 15
Plan Fractions Treated to Date: 12
Plan Prescribed Dose Per Fraction: 1.8 Gy
Plan Total Fractions Prescribed: 25
Plan Total Prescribed Dose: 45 Gy
Reference Point Dosage Given to Date: 21.6 Gy
Reference Point Session Dosage Given: 1.8 Gy
Session Number: 12

## 2022-04-22 ENCOUNTER — Other Ambulatory Visit: Payer: Self-pay

## 2022-04-22 ENCOUNTER — Ambulatory Visit
Admission: RE | Admit: 2022-04-22 | Discharge: 2022-04-22 | Disposition: A | Discharge status: Home / Self Care | Payer: BC Managed Care – PPO | Source: Ambulatory Visit | Attending: Radiation Oncology | Admitting: Radiation Oncology

## 2022-04-22 DIAGNOSIS — C539 Malignant neoplasm of cervix uteri, unspecified: Secondary | ICD-10-CM | POA: Diagnosis not present

## 2022-04-22 LAB — RAD ONC ARIA SESSION SUMMARY
Course Elapsed Days: 16
Plan Fractions Treated to Date: 13
Plan Prescribed Dose Per Fraction: 1.8 Gy
Plan Total Fractions Prescribed: 25
Plan Total Prescribed Dose: 45 Gy
Reference Point Dosage Given to Date: 23.4 Gy
Reference Point Session Dosage Given: 1.8 Gy
Session Number: 13

## 2022-04-23 ENCOUNTER — Ambulatory Visit
Admission: RE | Admit: 2022-04-23 | Discharge: 2022-04-23 | Disposition: A | Discharge status: Home / Self Care | Payer: BC Managed Care – PPO | Source: Ambulatory Visit | Attending: Radiation Oncology | Admitting: Radiation Oncology

## 2022-04-23 ENCOUNTER — Other Ambulatory Visit: Payer: Self-pay

## 2022-04-23 DIAGNOSIS — C539 Malignant neoplasm of cervix uteri, unspecified: Secondary | ICD-10-CM | POA: Diagnosis not present

## 2022-04-23 LAB — RAD ONC ARIA SESSION SUMMARY
Course Elapsed Days: 17
Plan Fractions Treated to Date: 14
Plan Prescribed Dose Per Fraction: 1.8 Gy
Plan Total Fractions Prescribed: 25
Plan Total Prescribed Dose: 45 Gy
Reference Point Dosage Given to Date: 25.2 Gy
Reference Point Session Dosage Given: 1.8 Gy
Session Number: 14

## 2022-04-24 ENCOUNTER — Ambulatory Visit
Admission: RE | Admit: 2022-04-24 | Discharge: 2022-04-24 | Disposition: A | Discharge status: Home / Self Care | Payer: BC Managed Care – PPO | Source: Ambulatory Visit | Attending: Radiation Oncology | Admitting: Radiation Oncology

## 2022-04-24 ENCOUNTER — Other Ambulatory Visit: Payer: Self-pay

## 2022-04-24 DIAGNOSIS — C539 Malignant neoplasm of cervix uteri, unspecified: Secondary | ICD-10-CM | POA: Diagnosis not present

## 2022-04-24 LAB — RAD ONC ARIA SESSION SUMMARY
Course Elapsed Days: 18
Plan Fractions Treated to Date: 15
Plan Prescribed Dose Per Fraction: 1.8 Gy
Plan Total Fractions Prescribed: 25
Plan Total Prescribed Dose: 45 Gy
Reference Point Dosage Given to Date: 27 Gy
Reference Point Session Dosage Given: 1.8 Gy
Session Number: 15

## 2022-04-27 ENCOUNTER — Ambulatory Visit
Admission: RE | Admit: 2022-04-27 | Discharge: 2022-04-27 | Disposition: A | Discharge status: Home / Self Care | Payer: BC Managed Care – PPO | Source: Ambulatory Visit | Attending: Radiation Oncology | Admitting: Radiation Oncology

## 2022-04-27 ENCOUNTER — Inpatient Hospital Stay: Payer: BC Managed Care – PPO

## 2022-04-27 ENCOUNTER — Other Ambulatory Visit: Payer: Self-pay

## 2022-04-27 DIAGNOSIS — Z5111 Encounter for antineoplastic chemotherapy: Secondary | ICD-10-CM | POA: Insufficient documentation

## 2022-04-27 DIAGNOSIS — Z90710 Acquired absence of both cervix and uterus: Secondary | ICD-10-CM | POA: Insufficient documentation

## 2022-04-27 DIAGNOSIS — R197 Diarrhea, unspecified: Secondary | ICD-10-CM | POA: Insufficient documentation

## 2022-04-27 DIAGNOSIS — C50812 Malignant neoplasm of overlapping sites of left female breast: Secondary | ICD-10-CM

## 2022-04-27 DIAGNOSIS — C541 Malignant neoplasm of endometrium: Secondary | ICD-10-CM | POA: Insufficient documentation

## 2022-04-27 DIAGNOSIS — C530 Malignant neoplasm of endocervix: Secondary | ICD-10-CM

## 2022-04-27 DIAGNOSIS — C539 Malignant neoplasm of cervix uteri, unspecified: Secondary | ICD-10-CM | POA: Diagnosis not present

## 2022-04-27 DIAGNOSIS — Z90722 Acquired absence of ovaries, bilateral: Secondary | ICD-10-CM | POA: Insufficient documentation

## 2022-04-27 DIAGNOSIS — R748 Abnormal levels of other serum enzymes: Secondary | ICD-10-CM | POA: Insufficient documentation

## 2022-04-27 LAB — RAD ONC ARIA SESSION SUMMARY
Course Elapsed Days: 21
Plan Fractions Treated to Date: 16
Plan Prescribed Dose Per Fraction: 1.8 Gy
Plan Total Fractions Prescribed: 25
Plan Total Prescribed Dose: 45 Gy
Reference Point Dosage Given to Date: 28.8 Gy
Reference Point Session Dosage Given: 1.8 Gy
Session Number: 16

## 2022-04-27 LAB — CBC WITH DIFFERENTIAL (CANCER CENTER ONLY)
Abs Immature Granulocytes: 0.01 10*3/uL (ref 0.00–0.07)
Basophils Absolute: 0 10*3/uL (ref 0.0–0.1)
Basophils Relative: 1 %
Eosinophils Absolute: 0.2 10*3/uL (ref 0.0–0.5)
Eosinophils Relative: 5 %
HCT: 35.5 % — ABNORMAL LOW (ref 36.0–46.0)
Hemoglobin: 11.7 g/dL — ABNORMAL LOW (ref 12.0–15.0)
Immature Granulocytes: 0 %
Lymphocytes Relative: 9 %
Lymphs Abs: 0.4 10*3/uL — ABNORMAL LOW (ref 0.7–4.0)
MCH: 29.7 pg (ref 26.0–34.0)
MCHC: 33 g/dL (ref 30.0–36.0)
MCV: 90.1 fL (ref 80.0–100.0)
Monocytes Absolute: 0.4 10*3/uL (ref 0.1–1.0)
Monocytes Relative: 8 %
Neutro Abs: 3.8 10*3/uL (ref 1.7–7.7)
Neutrophils Relative %: 77 %
Platelet Count: 184 10*3/uL (ref 150–400)
RBC: 3.94 MIL/uL (ref 3.87–5.11)
RDW: 14.4 % (ref 11.5–15.5)
WBC Count: 4.8 10*3/uL (ref 4.0–10.5)
nRBC: 0 % (ref 0.0–0.2)

## 2022-04-27 LAB — COMPREHENSIVE METABOLIC PANEL
ALT: 84 U/L — ABNORMAL HIGH (ref 0–44)
AST: 46 U/L — ABNORMAL HIGH (ref 15–41)
Albumin: 3.9 g/dL (ref 3.5–5.0)
Alkaline Phosphatase: 170 U/L — ABNORMAL HIGH (ref 38–126)
Anion gap: 5 (ref 5–15)
BUN: 11 mg/dL (ref 8–23)
CO2: 31 mmol/L (ref 22–32)
Calcium: 8.9 mg/dL (ref 8.9–10.3)
Chloride: 104 mmol/L (ref 98–111)
Creatinine, Ser: 0.68 mg/dL (ref 0.44–1.00)
GFR, Estimated: >60 mL/min (ref >60)
Glucose, Bld: 98 mg/dL (ref 70–99)
Potassium: 3.8 mmol/L (ref 3.5–5.1)
Sodium: 140 mmol/L (ref 135–145)
Total Bilirubin: 0.6 mg/dL (ref 0.3–1.2)
Total Protein: 6.5 g/dL (ref 6.5–8.1)

## 2022-04-27 LAB — MAGNESIUM: Magnesium: 1.9 mg/dL (ref 1.7–2.4)

## 2022-04-27 MED ORDER — SODIUM CHLORIDE 0.9% FLUSH
10.0000 mL | Freq: Once | INTRAVENOUS | Status: AC
  Administered 2022-04-27: 10 mL

## 2022-04-27 MED ORDER — HEPARIN SOD (PORK) LOCK FLUSH 100 UNIT/ML IV SOLN
500.0000 [IU] | Freq: Once | INTRAVENOUS | Status: AC
  Administered 2022-04-27: 500 [IU]

## 2022-04-28 ENCOUNTER — Inpatient Hospital Stay: Payer: BC Managed Care – PPO | Admitting: Hematology and Oncology

## 2022-04-28 ENCOUNTER — Encounter: Payer: Self-pay | Admitting: Hematology and Oncology

## 2022-04-28 ENCOUNTER — Ambulatory Visit
Admission: RE | Admit: 2022-04-28 | Discharge: 2022-04-28 | Disposition: A | Discharge status: Home / Self Care | Payer: BC Managed Care – PPO | Source: Ambulatory Visit | Attending: Radiation Oncology | Admitting: Radiation Oncology

## 2022-04-28 ENCOUNTER — Other Ambulatory Visit: Payer: Self-pay

## 2022-04-28 DIAGNOSIS — R748 Abnormal levels of other serum enzymes: Secondary | ICD-10-CM

## 2022-04-28 DIAGNOSIS — C539 Malignant neoplasm of cervix uteri, unspecified: Secondary | ICD-10-CM | POA: Diagnosis not present

## 2022-04-28 DIAGNOSIS — R197 Diarrhea, unspecified: Secondary | ICD-10-CM

## 2022-04-28 LAB — RAD ONC ARIA SESSION SUMMARY
Course Elapsed Days: 22
Plan Fractions Treated to Date: 17
Plan Prescribed Dose Per Fraction: 1.8 Gy
Plan Total Fractions Prescribed: 25
Plan Total Prescribed Dose: 45 Gy
Reference Point Dosage Given to Date: 30.6 Gy
Reference Point Session Dosage Given: 1.8 Gy
Session Number: 17

## 2022-04-28 NOTE — Progress Notes (Signed)
[name] ?OFFICE PROGRESS NOTE ? ?Patient Care Team: ?[name], MD as PCP - General (Cardiology) ? ?ASSESSMENT & PLAN:  ?Uterine cancer ([place]) ?Her recent vaginal bleeding could be still reviewed as a positive response to treatment ?Her blood counts are stable ?We will proceed with chemotherapy as scheduled ?When I see her next week, I plan to order repeat CT imaging within the month of completion of treatment with follow-up to see GYN surgeon for assessment and evaluation ? ?Elevated liver enzymes ?She has mild intermittent elevated liver enzymes, likely due to fatty liver change ?We will proceed with treatment without delay ? ?Diarrhea ?She has intermittent loose stool related to treatment ?Recommend Imodium and increase hydration as tolerated ? ?No orders of the defined types were placed in this encounter. ? ? ?All questions were answered. The patient knows to call the clinic with any problems, questions or concerns. ?The total time spent in the appointment was 20 minutes encounter with patients including review of chart and various tests results, discussions about plan of care and coordination of care plan ?  ?Heath Lark, MD ?04/28/2022 10:09 AM ? ?INTERVAL HISTORY: ?Please see below for problem oriented charting. ?she returns for treatment follow-up with her husband, seen prior to day 29 of treatment ?She tolerated treatment well so far except for intermittent loose stool ?Last week, she had significant vaginal bleeding with passage of clots but it spontaneously subsided since then ?Denies recent nausea ? ?REVIEW OF SYSTEMS:   ?Constitutional: Denies fevers, chills or abnormal weight loss ?Eyes: Denies blurriness of vision ?Ears, nose, mouth, throat, and face: Denies mucositis or sore throat ?Respiratory: Denies cough, dyspnea or wheezes ?Cardiovascular: Denies palpitation, chest discomfort or lower extremity swelling ? ?Skin: Denies abnormal skin rashes ?Lymphatics: Denies new  lymphadenopathy or easy bruising ?Neurological:Denies numbness, tingling or new weaknesses ?Behavioral/Psych: Mood is stable, no new changes  ?All other systems were reviewed with the patient and are negative. ? ?I have reviewed the past medical history, past surgical history, social history and family history with the patient and they are unchanged from previous note. ? ?ALLERGIES:  has No Known Allergies. ? ?MEDICATIONS:  ?Current Outpatient Medications  ?Medication Sig Dispense Refill  ? acetaminophen (TYLENOL) 500 MG tablet Take 1,000 mg by mouth every 6 (six) hours as needed.    ? atorvastatin (LIPITOR) 10 MG tablet Take 5 mg by mouth daily.    ? buPROPion (WELLBUTRIN SR) 150 MG 12 hr tablet Take 150 mg by mouth 2 (two) times daily.    ? diphenhydrAMINE (BENADRYL) 25 MG tablet Take 1 tablet (25 mg total) by mouth every 6 (six) hours as needed. 30 tablet 0  ? famotidine (PEPCID) 20 MG tablet Take 1 tablet (20 mg total) by mouth 2 (two) times daily. 30 tablet 0  ? FLUoxetine (PROZAC) 40 MG capsule Take 40 mg by mouth daily.    ? fluticasone (FLONASE) 50 MCG/ACT nasal spray Place 2 sprays into the nose daily as needed. Sinus Pressure    ? levothyroxine (SYNTHROID, LEVOTHROID) 25 MCG tablet Take 25 mcg by mouth daily.    ? lidocaine-prilocaine (EMLA) cream Apply to affected area once 30 g 3  ? meloxicam (MOBIC) 15 MG tablet Take 15 mg by mouth daily.    ? ondansetron (ZOFRAN) 8 MG tablet Take 1 tablet (8 mg total) by mouth 2 (two) times daily as needed. Start on the third day after cisplatin chemotherapy. 30 tablet 1  ? prochlorperazine (COMPAZINE) 10 MG tablet  Take 1 tablet (10 mg total) by mouth every 6 (six) hours as needed (Nausea or vomiting). 30 tablet 1  ? ?No current facility-administered medications for this visit.  ? ? 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  ?Uterine cancer [facility])  ?01/20/2013 Pathology  Results  ? Endometrium, biopsy ?- POLYPOID DEGENERATING SECRETORY-TYPE ENDOMETRIUM, SEE COMMENT ?  ?01/31/2013 Surgery  ? She underwent supracervical hysterectomy and bilateral salpingo-oophorectomy.  Uterus was ultimately morcellated for removal. ?  ?01/31/2013 Pathology Results  ? Uterus and bilateral fallopian tubes ?- ENDOMETRIUM: PROLIFERATIVE WITH BENIGN ENDOMETRIAL POLYP. NO HYPERPLASIA OR ?CARCINOMA. ?- MYOMETRIUM: ADENOMYOSIS. LEIOMYOMA. ?- UTERINE SEROSA: UNREMARKABLE. NO ENDOMETRIOSIS OR MALIGNANCY. ?- BILATERAL FALLOPIAN TUBES: UNREMARKABLE. ?  ?12/15/2021 Initial Diagnosis  ? The patient experienced postmenopausal bleeding. ?  ?01/30/2022 Imaging  ? Pelvic ultrasound exam was performed on 2/10 showing 18 x 18 x 16 cm masslike area within the cervix containing internal blood flow, question cervical fibroids or mass/neoplasm.   ?  ?01/30/2022 Pathology Results  ? 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  ?03/10/2022 PET scan  ? Hypermetabolic activity in uterine body consistent with primary uterine carcinoma ?  ?No evidence metastatic disease outside of the uterus ?  ?03/16/2022 Imaging  ? MRI pelvis ?3.6 x 2.8 x 2.9 cm cervical mass replaces and peripherally compresses normal cervical stroma stroma. There is marked thinning of the normal stroma posterolaterally on the right without definite parametrial extension on today's study. No involvement of the rectum. Assessment of the upper posterior bladder wall is limited by surgical scarring and susceptibility artifact from adjacent surgical clips in this region. Within this limitation, no focal bladder wall thickening or abnormal bladder wall enhancement is appreciated. Tumor extends posteriorly to the level of the external os, but no definite vaginal wall involvement appreciable by MRI. ?  ?No evidence for  pelvic sidewall lymphadenopathy. ?  ?  ?03/25/2022 Cancer Staging  ? Staging form: Corpus Uteri - Carcinoma and Carcinosarcoma, AJCC 8th Edition ?- Clinical stage from 03/25/2022: Stage II (cT2, cN0, cM0) - Signed by Heath Lark, MD on 03/25/2022 ?Stage prefix: Initial diagnosis ? ?  ?03/31/2022 Procedure  ? Successful placement of a right internal jugular approach power injectable Port-A-Cath. The catheter is ready for immediate use. ?  ?  ?04/03/2022 -  Chemotherapy  ? Patient is on Treatment Plan : Uterine Cisplatin days 1 and days 29 with radiation  ? ?  ?  ? ? ?PHYSICAL EXAMINATION: ?ECOG PERFORMANCE STATUS: 1 - Symptomatic but completely ambulatory ? ?Vitals:  ? 04/28/22 0951  ?BP: 138/73  ?Pulse: 84  ?Resp: 18  ?Temp: 98.1 ?F (36.7 ?C)  ?SpO2: 97%  ? ?Filed Weights  ? 04/28/22 0951  ?Weight: 168 lb (76.2 kg)  ? ? ?GENERAL:alert, no distress and comfortable ?NEURO: alert & oriented x 3 with fluent speech, no focal motor/sensory deficits ? ?LABORATORY DATA:  ?I have reviewed the data as listed ?   ?Component Value Date/Time  ? NA 140 04/27/2022 0933  ? K 3.8 04/27/2022 0933  ? CL 104 04/27/2022 0933  ? CO2 31 04/27/2022 0933  ? GLUCOSE 98 04/27/2022 0933  ? BUN 11 04/27/2022 0933  ? CREATININE 0.68 04/27/2022 0933  ? CALCIUM 8.9 04/27/2022 0933  ? PROT 6.5 04/27/2022 0933  ? ALBUMIN 3.9 04/27/2022 0933  ?  AST 46 (H) 04/27/2022 0933  ? ALT 84 (H) 04/27/2022 0933  ? ALKPHOS 170 (H) 04/27/2022 0933  ? BILITOT 0.6 04/27/2022 0933  ? GFRNONAA >60 04/27/2022 0933  ? GFRAA 75 (L) 01/27/2013 1521  ? ? ?No results found for: SPEP, UPEP ? ?Lab Results  ?Component Value Date  ? WBC 4.8 04/27/2022  ? NEUTROABS 3.8 04/27/2022  ? HGB 11.7 (L) 04/27/2022  ? HCT 35.5 (L) 04/27/2022  ? MCV 90.1 04/27/2022  ? PLT 184 04/27/2022  ? ? ?  Chemistry   ?   ?Component Value Date/Time  ? NA 140 04/27/2022 0933  ? K 3.8 04/27/2022 0933  ? CL 104 04/27/2022 0933  ? CO2 31 04/27/2022 0933  ? BUN 11 04/27/2022 0933  ? CREATININE 0.68 04/27/2022  0933  ?    ?Component Value Date/Time  ? CALCIUM 8.9 04/27/2022 0933  ? ALKPHOS 170 (H) 04/27/2022 0933  ? AST 46 (H) 04/27/2022 0933  ? ALT 84 (H) 04/27/2022 0933  ? BILITOT 0.6 04/27/2022 0933  ?  ? ? ?

## 2022-04-28 NOTE — Assessment & Plan Note (Signed)
She has intermittent loose stool related to treatment ?Recommend Imodium and increase hydration as tolerated ?

## 2022-04-28 NOTE — Assessment & Plan Note (Signed)
Her recent vaginal bleeding could be still reviewed as a positive response to treatment ?Her blood counts are stable ?We will proceed with chemotherapy as scheduled ?When I see her next week, I plan to order repeat CT imaging within the month of completion of treatment with follow-up to see GYN surgeon for assessment and evaluation ?

## 2022-04-28 NOTE — Assessment & Plan Note (Signed)
She has mild intermittent elevated liver enzymes, likely due to fatty liver change ?We will proceed with treatment without delay ?

## 2022-04-29 ENCOUNTER — Other Ambulatory Visit: Payer: Self-pay

## 2022-04-29 ENCOUNTER — Ambulatory Visit
Admission: RE | Admit: 2022-04-29 | Discharge: 2022-04-29 | Disposition: A | Discharge status: Home / Self Care | Payer: BC Managed Care – PPO | Source: Ambulatory Visit | Attending: Radiation Oncology | Admitting: Radiation Oncology

## 2022-04-29 DIAGNOSIS — C539 Malignant neoplasm of cervix uteri, unspecified: Secondary | ICD-10-CM | POA: Diagnosis not present

## 2022-04-29 LAB — RAD ONC ARIA SESSION SUMMARY
Course Elapsed Days: 23
Plan Fractions Treated to Date: 18
Plan Prescribed Dose Per Fraction: 1.8 Gy
Plan Total Fractions Prescribed: 25
Plan Total Prescribed Dose: 45 Gy
Reference Point Dosage Given to Date: 32.4 Gy
Reference Point Session Dosage Given: 1.8 Gy
Session Number: 18

## 2022-04-30 ENCOUNTER — Telehealth: Payer: Self-pay | Admitting: Oncology

## 2022-04-30 ENCOUNTER — Other Ambulatory Visit: Payer: Self-pay

## 2022-04-30 ENCOUNTER — Ambulatory Visit
Admission: RE | Admit: 2022-04-30 | Discharge: 2022-04-30 | Disposition: A | Discharge status: Home / Self Care | Payer: BC Managed Care – PPO | Source: Ambulatory Visit | Attending: Radiation Oncology | Admitting: Radiation Oncology

## 2022-04-30 ENCOUNTER — Other Ambulatory Visit: Payer: Self-pay | Admitting: Oncology

## 2022-04-30 ENCOUNTER — Encounter: Payer: Self-pay | Admitting: Oncology

## 2022-04-30 DIAGNOSIS — C530 Malignant neoplasm of endocervix: Secondary | ICD-10-CM

## 2022-04-30 DIAGNOSIS — C539 Malignant neoplasm of cervix uteri, unspecified: Secondary | ICD-10-CM | POA: Diagnosis not present

## 2022-04-30 LAB — RAD ONC ARIA SESSION SUMMARY
Course Elapsed Days: 24
Plan Fractions Treated to Date: 19
Plan Prescribed Dose Per Fraction: 1.8 Gy
Plan Total Fractions Prescribed: 25
Plan Total Prescribed Dose: 45 Gy
Reference Point Dosage Given to Date: 34.2 Gy
Reference Point Session Dosage Given: 1.8 Gy
Session Number: 19

## 2022-04-30 NOTE — Progress Notes (Signed)
MRI Pelvis order placed. ?

## 2022-04-30 NOTE — Telephone Encounter (Signed)
Called Elizabeth [facility]lett and advised her of MRI pelvis appointment on 05/08/22 at 10:00 following her final radiation treatment.  She verbalized understanding and agreement. ?

## 2022-05-01 ENCOUNTER — Inpatient Hospital Stay: Payer: BC Managed Care – PPO

## 2022-05-01 ENCOUNTER — Other Ambulatory Visit: Payer: Self-pay

## 2022-05-01 ENCOUNTER — Other Ambulatory Visit (HOSPITAL_COMMUNITY): Payer: Self-pay | Admitting: Radiation Oncology

## 2022-05-01 ENCOUNTER — Ambulatory Visit
Admission: RE | Admit: 2022-05-01 | Discharge: 2022-05-01 | Disposition: A | Discharge status: Home / Self Care | Payer: BC Managed Care – PPO | Source: Ambulatory Visit | Attending: Radiation Oncology | Admitting: Radiation Oncology

## 2022-05-01 ENCOUNTER — Other Ambulatory Visit: Payer: Self-pay | Admitting: Radiation Oncology

## 2022-05-01 VITALS — BP 149/84 | HR 76 | Temp 98.4°F | Resp 18 | Wt 166.8 lb

## 2022-05-01 DIAGNOSIS — C539 Malignant neoplasm of cervix uteri, unspecified: Secondary | ICD-10-CM

## 2022-05-01 DIAGNOSIS — C530 Malignant neoplasm of endocervix: Secondary | ICD-10-CM

## 2022-05-01 LAB — RAD ONC ARIA SESSION SUMMARY
Course Elapsed Days: 25
Plan Fractions Treated to Date: 20
Plan Prescribed Dose Per Fraction: 1.8 Gy
Plan Total Fractions Prescribed: 25
Plan Total Prescribed Dose: 45 Gy
Reference Point Dosage Given to Date: 36 Gy
Reference Point Session Dosage Given: 1.8 Gy
Session Number: 20

## 2022-05-01 MED ORDER — SODIUM CHLORIDE 0.9 % IV SOLN
50.0000 mg/m2 | Freq: Once | INTRAVENOUS | Status: AC
  Administered 2022-05-01: 90 mg via INTRAVENOUS
  Filled 2022-05-01: qty 90

## 2022-05-01 MED ORDER — HEPARIN SOD (PORK) LOCK FLUSH 100 UNIT/ML IV SOLN
500.0000 [IU] | Freq: Once | INTRAVENOUS | Status: AC | PRN
  Administered 2022-05-01: 500 [IU]

## 2022-05-01 MED ORDER — SODIUM CHLORIDE 0.9 % IV SOLN
150.0000 mg | Freq: Once | INTRAVENOUS | Status: AC
  Administered 2022-05-01: 150 mg via INTRAVENOUS
  Filled 2022-05-01: qty 150

## 2022-05-01 MED ORDER — POTASSIUM CHLORIDE IN NACL 20-0.9 MEQ/L-% IV SOLN
Freq: Once | INTRAVENOUS | Status: AC
  Filled 2022-05-01: qty 1000

## 2022-05-01 MED ORDER — SODIUM CHLORIDE 0.9 % IV SOLN
10.0000 mg | Freq: Once | INTRAVENOUS | Status: AC
  Administered 2022-05-01: 10 mg via INTRAVENOUS
  Filled 2022-05-01: qty 10

## 2022-05-01 MED ORDER — PALONOSETRON HCL INJECTION 0.25 MG/5ML
0.2500 mg | Freq: Once | INTRAVENOUS | Status: AC
  Administered 2022-05-01: 0.25 mg via INTRAVENOUS
  Filled 2022-05-01: qty 5

## 2022-05-01 MED ORDER — SODIUM CHLORIDE 0.9 % IV SOLN: Freq: Once | INTRAVENOUS | Status: AC

## 2022-05-01 MED ORDER — SODIUM CHLORIDE 0.9% FLUSH
10.0000 mL | INTRAVENOUS | Status: DC | PRN
  Administered 2022-05-01: 10 mL

## 2022-05-01 MED ORDER — MAGNESIUM SULFATE 2 GM/50ML IV SOLN
2.0000 g | Freq: Once | INTRAVENOUS | Status: AC
  Administered 2022-05-01: 2 g via INTRAVENOUS
  Filled 2022-05-01: qty 50

## 2022-05-01 NOTE — Patient Instructions (Signed)
[facility]COL[facility]  Discharge Instructions: Thank you for choosing [facility] to provide your oncology and hematology care.   If you have a lab appointment with the Cancer Center, please go directly to the Cancer Center and check in at the registration area.   Wear comfortable clothing and clothing appropriate for easy access to any Portacath or PICC line.   We strive to give you quality time with your provider. You may need to reschedule your appointment if you arrive late (15 or more minutes).  Arriving late affects you and other patients whose appointments are after yours.  Also, if you miss three or more appointments without notifying the office, you may be dismissed from the clinic at the provider's discretion.      For prescription refill requests, have your pharmacy contact our office and allow 72 hours for refills to be completed.    Today you received the following chemotherapy and/or immunotherapy agents : Cisplatin    To help prevent nausea and vomiting after your treatment, we encourage you to take your nausea medication as directed.  BELOW ARE SYMPTOMS THAT SHOULD BE REPORTED IMMEDIATELY: *FEVER GREATER THAN 100.4 F (38 C) OR HIGHER *CHILLS OR SWEATING *NAUSEA AND VOMITING THAT IS NOT CONTROLLED WITH YOUR NAUSEA MEDICATION *UNUSUAL SHORTNESS OF BREATH *UNUSUAL BRUISING OR BLEEDING *URINARY PROBLEMS (pain or burning when urinating, or frequent urination) *BOWEL PROBLEMS (unusual diarrhea, constipation, pain near the anus) TENDERNESS IN MOUTH AND THROAT WITH OR WITHOUT PRESENCE OF ULCERS (sore throat, sores in mouth, or a toothache) UNUSUAL RASH, SWELLING OR PAIN  UNUSUAL VAGINAL DISCHARGE OR ITCHING   Items with * indicate a potential emergency and should be followed up as soon as possible or go to the Emergency Department if any problems should occur.  Please show the CHEMOTHERAPY ALERT CARD or IMMUNOTHERAPY ALERT CARD at check-in to  the Emergency Department and triage nurse.  Should you have questions after your visit or need to cancel or reschedule your appointment, please contact [facility]  Dept: [phone]  and follow the prompts.  Office hours are 8:00 a.m. to 4:30 p.m. Monday - Friday. Please note that voicemails left after 4:00 p.m. may not be returned until the following business day.  We are closed weekends and major holidays. You have access to a nurse at all times for urgent questions. Please call the main number to the clinic Dept: [phone] and follow the prompts.   For any non-urgent questions, you may also contact your provider using MyChart. We now offer e-Visits for anyone 18 and older to request care online for non-urgent symptoms. For details visit [email or web address].   Also download the MyChart app! Go to the app store, search "MyChart", open the app, select [facility], and log in with your MyChart username and password.  Due to Covid, a mask is required upon entering the hospital/clinic. If you do not have a mask, one will be given to you upon arrival. For doctor visits, patients may have 1 support person aged 18 or older with them. For treatment visits, patients cannot have anyone with them due to current Covid guidelines and our immunocompromised population.   

## 2022-05-04 ENCOUNTER — Ambulatory Visit
Admission: RE | Admit: 2022-05-04 | Discharge: 2022-05-04 | Disposition: A | Discharge status: Home / Self Care | Payer: BC Managed Care – PPO | Source: Ambulatory Visit | Attending: Radiation Oncology | Admitting: Radiation Oncology

## 2022-05-04 ENCOUNTER — Inpatient Hospital Stay: Payer: BC Managed Care – PPO

## 2022-05-04 ENCOUNTER — Other Ambulatory Visit: Payer: Self-pay

## 2022-05-04 DIAGNOSIS — C530 Malignant neoplasm of endocervix: Secondary | ICD-10-CM

## 2022-05-04 DIAGNOSIS — C539 Malignant neoplasm of cervix uteri, unspecified: Secondary | ICD-10-CM | POA: Diagnosis not present

## 2022-05-04 DIAGNOSIS — C50811 Malignant neoplasm of overlapping sites of right female breast: Secondary | ICD-10-CM

## 2022-05-04 LAB — MAGNESIUM: Magnesium: 1.8 mg/dL (ref 1.7–2.4)

## 2022-05-04 LAB — RAD ONC ARIA SESSION SUMMARY
Course Elapsed Days: 28
Plan Fractions Treated to Date: 21
Plan Prescribed Dose Per Fraction: 1.8 Gy
Plan Total Fractions Prescribed: 25
Plan Total Prescribed Dose: 45 Gy
Reference Point Dosage Given to Date: 37.8 Gy
Reference Point Session Dosage Given: 1.8 Gy
Session Number: 21

## 2022-05-04 LAB — COMPREHENSIVE METABOLIC PANEL
ALT: 50 U/L — ABNORMAL HIGH (ref 0–44)
AST: 16 U/L (ref 15–41)
Albumin: 3.8 g/dL (ref 3.5–5.0)
Alkaline Phosphatase: 134 U/L — ABNORMAL HIGH (ref 38–126)
Anion gap: 6 (ref 5–15)
BUN: 11 mg/dL (ref 8–23)
CO2: 33 mmol/L — ABNORMAL HIGH (ref 22–32)
Calcium: 8.8 mg/dL — ABNORMAL LOW (ref 8.9–10.3)
Chloride: 100 mmol/L (ref 98–111)
Creatinine, Ser: 0.75 mg/dL (ref 0.44–1.00)
GFR, Estimated: >60 mL/min (ref >60)
Glucose, Bld: 99 mg/dL (ref 70–99)
Potassium: 3.7 mmol/L (ref 3.5–5.1)
Sodium: 139 mmol/L (ref 135–145)
Total Bilirubin: 0.5 mg/dL (ref 0.3–1.2)
Total Protein: 6.3 g/dL — ABNORMAL LOW (ref 6.5–8.1)

## 2022-05-04 LAB — CBC WITH DIFFERENTIAL (CANCER CENTER ONLY)
Abs Immature Granulocytes: 0.04 10*3/uL (ref 0.00–0.07)
Basophils Absolute: 0 10*3/uL (ref 0.0–0.1)
Basophils Relative: 1 %
Eosinophils Absolute: 0.1 10*3/uL (ref 0.0–0.5)
Eosinophils Relative: 1 %
HCT: 35.9 % — ABNORMAL LOW (ref 36.0–46.0)
Hemoglobin: 12 g/dL (ref 12.0–15.0)
Immature Granulocytes: 1 %
Lymphocytes Relative: 5 %
Lymphs Abs: 0.3 10*3/uL — ABNORMAL LOW (ref 0.7–4.0)
MCH: 30.2 pg (ref 26.0–34.0)
MCHC: 33.4 g/dL (ref 30.0–36.0)
MCV: 90.4 fL (ref 80.0–100.0)
Monocytes Absolute: 0.5 10*3/uL (ref 0.1–1.0)
Monocytes Relative: 7 %
Neutro Abs: 6.3 10*3/uL (ref 1.7–7.7)
Neutrophils Relative %: 85 %
Platelet Count: 272 10*3/uL (ref 150–400)
RBC: 3.97 MIL/uL (ref 3.87–5.11)
RDW: 15 % (ref 11.5–15.5)
WBC Count: 7.4 10*3/uL (ref 4.0–10.5)
nRBC: 0 % (ref 0.0–0.2)

## 2022-05-04 MED ORDER — HEPARIN SOD (PORK) LOCK FLUSH 100 UNIT/ML IV SOLN
500.0000 [IU] | Freq: Once | INTRAVENOUS | Status: AC
  Administered 2022-05-04: 500 [IU]

## 2022-05-04 MED ORDER — SODIUM CHLORIDE 0.9% FLUSH
10.0000 mL | Freq: Once | INTRAVENOUS | Status: AC
  Administered 2022-05-04: 10 mL

## 2022-05-05 ENCOUNTER — Encounter: Payer: Self-pay | Admitting: Hematology and Oncology

## 2022-05-05 ENCOUNTER — Ambulatory Visit
Admission: RE | Admit: 2022-05-05 | Discharge: 2022-05-05 | Disposition: A | Discharge status: Home / Self Care | Payer: BC Managed Care – PPO | Source: Ambulatory Visit | Attending: Radiation Oncology | Admitting: Radiation Oncology

## 2022-05-05 ENCOUNTER — Inpatient Hospital Stay (HOSPITAL_BASED_OUTPATIENT_CLINIC_OR_DEPARTMENT_OTHER): Payer: BC Managed Care – PPO | Admitting: Hematology and Oncology

## 2022-05-05 ENCOUNTER — Other Ambulatory Visit: Payer: Self-pay

## 2022-05-05 VITALS — BP 147/74 | HR 83 | Temp 98.4°F | Resp 18 | Ht 60.0 in | Wt 165.2 lb

## 2022-05-05 DIAGNOSIS — C530 Malignant neoplasm of endocervix: Secondary | ICD-10-CM | POA: Diagnosis not present

## 2022-05-05 DIAGNOSIS — R748 Abnormal levels of other serum enzymes: Secondary | ICD-10-CM | POA: Diagnosis not present

## 2022-05-05 DIAGNOSIS — C539 Malignant neoplasm of cervix uteri, unspecified: Secondary | ICD-10-CM | POA: Diagnosis not present

## 2022-05-05 LAB — RAD ONC ARIA SESSION SUMMARY
Course Elapsed Days: 29
Plan Fractions Treated to Date: 22
Plan Prescribed Dose Per Fraction: 1.8 Gy
Plan Total Fractions Prescribed: 25
Plan Total Prescribed Dose: 45 Gy
Reference Point Dosage Given to Date: 39.6 Gy
Reference Point Session Dosage Given: 1.8 Gy
Session Number: 22

## 2022-05-05 NOTE — Assessment & Plan Note (Signed)
She had completed recent treatment without major side effects ?I plan to order repeat CT imaging within the month of completion of treatment with follow-up to see GYN surgeon for assessment and evaluation ?

## 2022-05-05 NOTE — Progress Notes (Signed)
[facility] ?OFFICE PROGRESS NOTE ? ?Patient Care Team: ?Elizabeth Hilding, MD as PCP - General (Cardiology) ? ?ASSESSMENT & PLAN:  ?Uterine cancer ([place]) ?She had completed recent treatment without major side effects ?I plan to order repeat CT imaging within the month of completion of treatment with follow-up to see GYN surgeon for assessment and evaluation ? ?Elevated liver enzymes ?She has mild intermittent elevated liver enzymes, likely due to fatty liver change ?We will observe only ? ?Orders Placed This Encounter  ?Procedures  ? CT ABDOMEN PELVIS W CONTRAST  ?  Standing Status:   Future  ?  Standing Expiration Date:   05/06/2023  ?  Order Specific Question:   If indicated for the ordered procedure, I authorize the administration of contrast media per Radiology protocol  ?  Answer:   Yes  ?  Order Specific Question:   Preferred imaging location?  ?  Answer:   [facility]  ?  Order Specific Question:   Radiology Contrast Protocol - do NOT remove file path  ?  Answer:   [email or web address]  ? ? ?All questions were answered. The patient knows to call the clinic with any problems, questions or concerns. ?The total time spent in the appointment was 20 minutes encounter with patients including review of chart and various tests results, discussions about plan of care and coordination of care plan ?  ?Heath Lark, MD ?05/05/2022 11:18 AM ? ?INTERVAL HISTORY: ?Please see below for problem oriented charting. ?she returns for treatment follow-up with her husband ?She is doing well ?She denies major side effects from treatment recently ?Appetite is fair ?No severe nausea or vomiting ? ?REVIEW OF SYSTEMS:   ?Constitutional: Denies fevers, chills or abnormal weight loss ?Eyes: Denies blurriness of vision ?Ears, nose, mouth, throat, and face: Denies mucositis or sore throat ?Respiratory: Denies cough, dyspnea or wheezes ?Cardiovascular: Denies palpitation, chest discomfort  or lower extremity swelling ?Gastrointestinal:  Denies nausea, heartburn or change in bowel habits ?Skin: Denies abnormal skin rashes ?Lymphatics: Denies new lymphadenopathy or easy bruising ?Neurological:Denies numbness, tingling or new weaknesses ?Behavioral/Psych: Mood is stable, no new changes  ?All other systems were reviewed with the patient and are negative. ? ?I have reviewed the past medical history, past surgical history, social history and family history with the patient and they are unchanged from previous note. ? ?ALLERGIES:  has No Known Allergies. ? ?MEDICATIONS:  ?Current Outpatient Medications  ?Medication Sig Dispense Refill  ? acetaminophen (TYLENOL) 500 MG tablet Take 1,000 mg by mouth every 6 (six) hours as needed.    ? atorvastatin (LIPITOR) 10 MG tablet Take 5 mg by mouth daily.    ? buPROPion (WELLBUTRIN SR) 150 MG 12 hr tablet Take 150 mg by mouth 2 (two) times daily.    ? diphenhydrAMINE (BENADRYL) 25 MG tablet Take 1 tablet (25 mg total) by mouth every 6 (six) hours as needed. 30 tablet 0  ? famotidine (PEPCID) 20 MG tablet Take 1 tablet (20 mg total) by mouth 2 (two) times daily. 30 tablet 0  ? FLUoxetine (PROZAC) 40 MG capsule Take 40 mg by mouth daily.    ? fluticasone (FLONASE) 50 MCG/ACT nasal spray Place 2 sprays into the nose daily as needed. Sinus Pressure    ? levothyroxine (SYNTHROID, LEVOTHROID) 25 MCG tablet Take 25 mcg by mouth daily.    ? lidocaine-prilocaine (EMLA) cream Apply to affected area once 30 g 3  ? meloxicam (MOBIC) 15 MG tablet Take 15 mg by  mouth daily.    ? ondansetron (ZOFRAN) 8 MG tablet Take 1 tablet (8 mg total) by mouth 2 (two) times daily as needed. Start on the third day after cisplatin chemotherapy. 30 tablet 1  ? prochlorperazine (COMPAZINE) 10 MG tablet Take 1 tablet (10 mg total) by mouth every 6 (six) hours as needed (Nausea or vomiting). 30 tablet 1  ? ?No current facility-administered medications for this visit.  ? ? 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  ?Uterine cancer [facility])  ?01/20/2013 Pathology Results  ? Endometrium, biopsy ?- POLYPOID DEGENERATING SECRETORY-TYPE ENDOMETRIUM, SEE COMMENT ?  ?01/31/2013 Surgery  ? She underwent supracervical hysterectomy and bilateral salpingo-oophorectomy.  Uterus was ultimately morcellated for removal. ?  ?01/31/2013 Pathology Results  ? Uterus and bilateral fallopian tubes ?- ENDOMETRIUM: PROLIFERATIVE WITH BENIGN ENDOMETRIAL POLYP. NO HYPERPLASIA OR ?CARCINOMA. ?- MYOMETRIUM: ADENOMYOSIS. LEIOMYOMA. ?- UTERINE SEROSA: UNREMARKABLE. NO ENDOMETRIOSIS OR MALIGNANCY. ?- BILATERAL FALLOPIAN TUBES: UNREMARKABLE. ?  ?12/15/2021 Initial Diagnosis  ? The patient experienced postmenopausal bleeding. ?  ?01/30/2022 Imaging  ? Pelvic ultrasound exam was performed on 2/10 showing 18 x 18 x 16 cm masslike area within the cervix containing internal blood flow, question cervical fibroids or mass/neoplasm.   ?  ?01/30/2022 Pathology Results  ? 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  ?03/10/2022 PET scan  ? Hypermetabolic activity in uterine body consistent with primary uterine carcinoma ?  ?No evidence metastatic disease outside of the uterus ?  ?03/16/2022 Imaging  ? MRI pelvis ?3.6 x 2.8 x 2.9 cm cervical mass replaces and peripherally compresses normal cervical stroma stroma. There is marked thinning of the normal stroma posterolaterally on the right without definite parametrial extension on today's study. No involvement of the rectum. Assessment of the upper posterior bladder wall is limited by surgical scarring and susceptibility artifact from adjacent surgical clips in this region. Within this limitation, no focal bladder wall thickening or abnormal bladder wall  enhancement is appreciated. Tumor extends posteriorly to the level of the external os, but no definite vaginal wall involvement appreciable by MRI. ?  ?No evidence for pelvic sidewall lymphadenopathy. ?  ?  ?03/25/2022 Cancer Staging  ? Staging form: Corpus Uteri - Carcinoma and Carcinosarcoma, AJCC 8th Edition ?- Clinical stage from 03/25/2022: Stage II (cT2, cN0, cM0) - Signed by Heath Lark, MD on 03/25/2022 ?Stage prefix: Initial diagnosis ? ?  ?03/31/2022 Procedure  ? Successful placement of a right internal jugular approach power injectable Port-A-Cath. The catheter is ready for immediate use. ?  ?  ?04/03/2022 -  Chemotherapy  ? Patient is on Treatment Plan : Uterine Cisplatin days 1 and days 29 with radiation  ? ?   ? ? ?PHYSICAL EXAMINATION: ?ECOG PERFORMANCE STATUS: 1 - Symptomatic but completely ambulatory ? ?Vitals:  ? 05/05/22 1008  ?BP: (!) 147/74  ?Pulse: 83  ?Resp: 18  ?Temp: 98.4 ?F (36.9 ?C)  ?SpO2: 98%  ? ?Filed Weights  ? 05/05/22 1008  ?Weight: 165 lb 3.2 oz (74.9 kg)  ? ? ?GENERAL:alert, no distress and comfortable ?NEURO: alert & oriented x 3 with fluent speech, no focal motor/sensory deficits ? ?LABORATORY DATA:  ?I have reviewed the data as listed ?   ?Component Value Date/Time  ? NA 139 05/04/2022 0933  ? K 3.7 05/04/2022 0933  ? CL 100 05/04/2022  9485  ? CO2 33 (H) 05/04/2022 0933  ? GLUCOSE 99 05/04/2022 0933  ? BUN 11 05/04/2022 0933  ? CREATININE 0.75 05/04/2022 0933  ? CALCIUM 8.8 (L) 05/04/2022 0933  ? PROT 6.3 (L) 05/04/2022 0933  ? ALBUMIN 3.8 05/04/2022 0933  ? AST 16 05/04/2022 0933  ? ALT 50 (H) 05/04/2022 0933  ? ALKPHOS 134 (H) 05/04/2022 0933  ? BILITOT 0.5 05/04/2022 0933  ? GFRNONAA >60 05/04/2022 0933  ? GFRAA 75 (L) 01/27/2013 1521  ? ? ?No results found for: SPEP, UPEP ? ?Lab Results  ?Component Value Date  ? WBC 7.4 05/04/2022  ? NEUTROABS 6.3 05/04/2022  ? HGB 12.0 05/04/2022  ? HCT 35.9 (L) 05/04/2022  ? MCV 90.4 05/04/2022  ? PLT 272 05/04/2022  ? ? ?  Chemistry   ?    ?Component Value Date/Time  ? NA 139 05/04/2022 0933  ? K 3.7 05/04/2022 0933  ? CL 100 05/04/2022 0933  ? CO2 33 (H) 05/04/2022 0933  ? BUN 11 05/04/2022 0933  ? CREATININE 0.75 05/04/2022 0933  ?    ?Component Value Date/Ti

## 2022-05-05 NOTE — Assessment & Plan Note (Signed)
She has mild intermittent elevated liver enzymes, likely due to fatty liver change ?We will observe only ?

## 2022-05-05 NOTE — H&P (Signed)
?Radiation Oncology         (336) [phone] ?________________________________ ? ?Preoperative history and physical examination ? ?Name: Elizabeth Bartlett MRN: [id number]  ?Date: 05/01/2022  DOB: 1961-01-28 ? ? ? ? ? 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  (Likely originating in the residual lower uterine segment, after supracervical hysterectomy, with infiltration of the cervical region) ? ?HISTORY OF PRESENT ILLNESS: ? ?The patient presented to Dr. Adah Perl [facility]) on 01/20/22 with complaints of intermittent postmenopausal bleeding. The patient was also noted to report vaginal bleeding prior to christmas time, and noted that she had a hysterectomy years ago (detailed below). Physical exam performed during this visit revealed blood from the OS. Pap performed during this visit revealed atypical glandular cells.  ? ?Subsequent transvaginal US performed for further evaluation on 01/30/22 revealed a 18 x 18 x 16 mm diameter masslike area within the cervix containing internal blood flow, concerning for cervical fibroid vs cervical mass/neoplasm. ? ?Endocervical biopsies collected on 02/09/22 showed FIGO grade 2 endometrioid adenocarcinoma ? ?CT of the abdomen and pelvis with contrast on 02/10/22 showed the known soft tissue mass as stable in the interval, measuring 4.3 x 3.0 cm, and located just posterior and inferior to surgical clips within the pelvis. No findings of metastatic disease involving the abdomen or pelvis were otherwise appreciated. (The identified mass was noted to be either the patient's cervix if she has had a supracervical hysterectomy versus a vaginal cuff mass). ? ?Given biopsy findings, the patient was accordingly referred to Dr. Berline Lopes on 02/19/22 for further management. During this visit, the patient denied any symptoms other than intermittent vaginal bleeding. Ultimately, Dr. Berline Lopes recommended further work-up via Pap and PET scan, followed  by review of her case at the tumor board, prior to considering supracervical hysterectomy. Pap collected during this visit also revealed atypical grandular cells.  ? 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 ? ?Of note: Patient underwent a supracervical hysterectomy in 2014 for abnormal uterine bleeding and fibroids.  Uterus was ultimately morcellated for removal.  Fallopian tubes were also removed.  Patient has been under the impression that her cervix was removed until very recently.  ? 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 Left breast also treated with XRT, and anastrozole.   ? ?She has recently completed her external beam radiation therapy and is now to be taken to the operating room for exam under anesthesia and placement of brachytherapy equipment in preparation for high-dose-rate radiation therapy. ? ?PAST MEDICAL HISTORY:  ?Past Medical History:  ?Diagnosis Date  ? Anxiety   ? BMI 31.0-31.9,adult   ? Breast cancer ([place])   ? Depression   ? High cholesterol   ? Hypothyroidism   ? ? ?PAST SURGICAL HISTORY: ?Past Surgical History:  ?Procedure Laterality Date  ? BILATERAL SALPINGECTOMY Bilateral 01/31/2013  ? Procedure: BILATERAL SALPINGECTOMY;  Surgeon: Jonnie Kind, MD;  Location: [facility];  Service: Gynecology;  Laterality: Bilateral;  ? CESAREAN SECTION  02/19/1987  ? CHOLECYSTECTOMY  05/22/1987  ? open  ? IR IMAGING GUIDED PORT INSERTION  03/30/2022  ? LAPAROSCOPIC SUPRACERVICAL HYSTERECTOMY N/A 01/31/2013  ? Procedure: LAPAROSCOPIC SUPRACERVICAL HYSTERECTOMY;  Surgeon: Jonnie Kind, MD;  Location: [facility];  Service: Gynecology;  Laterality: N/A;  ? lumpectomy Bilateral   ? With lymph node dissection on the left  ? ? ?  FAMILY HISTORY:  ?Family History  ?Problem Relation Age of Onset  ? COPD Mother   ?  Cancer Father   ?     lung  ? Colon cancer Father   ? Breast cancer Paternal Aunt   ?     dx early 86s  ? Cirrhosis Maternal Grandmother   ?     non-alcoholic cirrhosis  ? Heart attack Maternal Grandfather   ? Diabetes Maternal Grandfather   ? Colon cancer Paternal Grandfather   ?     dx 19s  ? Lymphoma Cousin   ? Ovarian cancer Neg Hx   ? Endometrial cancer Neg Hx   ? Pancreatic cancer Neg Hx   ? Prostate cancer Neg Hx   ? ? ?SOCIAL HISTORY:  ?Social History  ? ?Tobacco Use  ? Smoking status: Never  ?Vaping Use  ? Vaping Use: Never used  ?Substance Use Topics  ? Alcohol use: No  ? Drug use: No  ? ? ?ALLERGIES: No Known Allergies ? ?MEDICATIONS:  ?No current facility-administered medications for this encounter.  ? ?Current Outpatient Medications  ?Medication Sig Dispense Refill  ? acetaminophen (TYLENOL) 500 MG tablet Take 1,000 mg by mouth every 6 (six) hours as needed.    ? atorvastatin (LIPITOR) 10 MG tablet Take 5 mg by mouth daily.    ? buPROPion (WELLBUTRIN SR) 150 MG 12 hr tablet Take 150 mg by mouth 2 (two) times daily.    ? diphenhydrAMINE (BENADRYL) 25 MG tablet Take 1 tablet (25 mg total) by mouth every 6 (six) hours as needed. 30 tablet 0  ? famotidine (PEPCID) 20 MG tablet Take 1 tablet (20 mg total) by mouth 2 (two) times daily. 30 tablet 0  ? FLUoxetine (PROZAC) 40 MG capsule Take 40 mg by mouth daily.    ? fluticasone (FLONASE) 50 MCG/ACT nasal spray Place 2 sprays into the nose daily as needed. Sinus Pressure    ? levothyroxine (SYNTHROID, LEVOTHROID) 25 MCG tablet Take 25 mcg by mouth daily.    ? lidocaine-prilocaine (EMLA) cream Apply to affected area once 30 g 3  ? meloxicam (MOBIC) 15 MG tablet Take 15 mg by mouth daily.    ? ondansetron (ZOFRAN) 8 MG tablet Take 1 tablet (8 mg total) by mouth 2 (two) times daily as needed. Start on the third day after cisplatin chemotherapy. 30 tablet 1  ? prochlorperazine (COMPAZINE) 10 MG tablet Take 1 tablet (10 mg total) by mouth every 6 (six) hours as  needed (Nausea or vomiting). 30 tablet 1  ? ? ?REVIEW OF SYSTEMS:  A 10+ POINT REVIEW OF SYSTEMS WAS OBTAINED including neurology, dermatology, psychiatry, cardiac, respiratory, lymph, extremities, GI, GU, musculoskeletal, constitutional, reproductive, HEENT.  She continues to have some intermittent sporadic vaginal bleeding but very low volume.  She has a mild crampy type sensation within the deep pelvis.  She denies any hematuria or rectal bleeding. ?  ?PHYSICAL EXAM:  ?General: Alert and oriented, in no acute distress ?HEENT: Head is normocephalic. Extraocular movements are intact. ?Neck: Neck is supple, no palpable cervical or supraclavicular lymphadenopathy. ?Heart: Regular in rate and rhythm with no murmurs, rubs, or gallops. ?Chest: Clear to auscultation bilaterally, with no rhonchi, wheezes, or rales. ?Abdomen: Soft, nontender, nondistended, with no rigidity or guarding. ?Extremities: No cyanosis or edema. ?Lymphatics: see Neck Exam ?Skin: No concerning lesions. ?Musculoskeletal: symmetric strength and muscle tone throughout. ?Neurologic: Cranial nerves II through XII are grossly intact. No obvious focalities. Speech is fluent. Coordination is intact. ?Psychiatric:  Judgment and insight are intact. Affect is appropriate. ?A pelvic exam is performed.  The external genitalia are unremarkable.  A speculum exam is performed.  No blood is noted in the vaginal vault.  The cervical os is identified which shows some erythema.  On bimanual and rectovaginal examination the cervix is firm and nodular throughout consistent with probable tumor infiltration.  No obvious parametrial extension on exam. ? ?ECOG = 1 ? ? ?LABORATORY DATA:  ?Lab Results  ?Component Value Date  ? WBC 7.4 05/04/2022  ? HGB 12.0 05/04/2022  ? HCT 35.9 (L) 05/04/2022  ? MCV 90.4 05/04/2022  ? PLT 272 05/04/2022  ? NEUTROABS 6.3 05/04/2022  ? ?Lab Results  ?Component Value Date  ? NA 139 05/04/2022  ? K 3.7 05/04/2022  ? CL 100 05/04/2022  ? CO2 33  (H) 05/04/2022  ? GLUCOSE 99 05/04/2022  ? CREATININE 0.75 05/04/2022  ? CALCIUM 8.8 (L) 05/04/2022  ? ? ?  ?RADIOGRAPHY: No results found. ?   ?IMPRESSION: FIGO grade 1-2 endometrioid adenocarcinoma, with squa

## 2022-05-06 ENCOUNTER — Other Ambulatory Visit: Payer: Self-pay

## 2022-05-06 ENCOUNTER — Ambulatory Visit
Admission: RE | Admit: 2022-05-06 | Discharge: 2022-05-06 | Disposition: A | Discharge status: Home / Self Care | Payer: BC Managed Care – PPO | Source: Ambulatory Visit | Attending: Radiation Oncology | Admitting: Radiation Oncology

## 2022-05-06 DIAGNOSIS — C539 Malignant neoplasm of cervix uteri, unspecified: Secondary | ICD-10-CM | POA: Diagnosis not present

## 2022-05-06 LAB — RAD ONC ARIA SESSION SUMMARY
Course Elapsed Days: 30
Plan Fractions Treated to Date: 23
Plan Prescribed Dose Per Fraction: 1.8 Gy
Plan Total Fractions Prescribed: 25
Plan Total Prescribed Dose: 45 Gy
Reference Point Dosage Given to Date: 41.4 Gy
Reference Point Session Dosage Given: 1.8 Gy
Session Number: 23

## 2022-05-07 ENCOUNTER — Ambulatory Visit
Admission: RE | Admit: 2022-05-07 | Discharge: 2022-05-07 | Disposition: A | Discharge status: Home / Self Care | Payer: BC Managed Care – PPO | Source: Ambulatory Visit | Attending: Radiation Oncology | Admitting: Radiation Oncology

## 2022-05-07 ENCOUNTER — Other Ambulatory Visit: Payer: Self-pay

## 2022-05-07 DIAGNOSIS — C539 Malignant neoplasm of cervix uteri, unspecified: Secondary | ICD-10-CM | POA: Diagnosis not present

## 2022-05-07 LAB — RAD ONC ARIA SESSION SUMMARY
Course Elapsed Days: 31
Plan Fractions Treated to Date: 24
Plan Prescribed Dose Per Fraction: 1.8 Gy
Plan Total Fractions Prescribed: 25
Plan Total Prescribed Dose: 45 Gy
Reference Point Dosage Given to Date: 43.2 Gy
Reference Point Session Dosage Given: 1.8 Gy
Session Number: 24

## 2022-05-08 ENCOUNTER — Other Ambulatory Visit: Payer: Self-pay

## 2022-05-08 ENCOUNTER — Ambulatory Visit (HOSPITAL_COMMUNITY)
Admission: RE | Admit: 2022-05-08 | Discharge: 2022-05-08 | Disposition: A | Discharge status: Home / Self Care | Payer: BC Managed Care – PPO | Source: Ambulatory Visit | Attending: Radiation Oncology | Admitting: Radiation Oncology

## 2022-05-08 ENCOUNTER — Inpatient Hospital Stay: Payer: BC Managed Care – PPO

## 2022-05-08 ENCOUNTER — Ambulatory Visit
Admission: RE | Admit: 2022-05-08 | Discharge: 2022-05-08 | Disposition: A | Discharge status: Home / Self Care | Payer: BC Managed Care – PPO | Source: Ambulatory Visit | Attending: Radiation Oncology | Admitting: Radiation Oncology

## 2022-05-08 DIAGNOSIS — C539 Malignant neoplasm of cervix uteri, unspecified: Secondary | ICD-10-CM | POA: Insufficient documentation

## 2022-05-08 DIAGNOSIS — C530 Malignant neoplasm of endocervix: Secondary | ICD-10-CM

## 2022-05-08 LAB — RAD ONC ARIA SESSION SUMMARY
Course Elapsed Days: 32
Plan Fractions Treated to Date: 25
Plan Prescribed Dose Per Fraction: 1.8 Gy
Plan Total Fractions Prescribed: 25
Plan Total Prescribed Dose: 45 Gy
Reference Point Dosage Given to Date: 45 Gy
Reference Point Session Dosage Given: 1.8 Gy
Session Number: 25

## 2022-05-08 IMAGING — MR MR PELVIS WO/W CM
17 series · 48 of 48 positions shown · IV contrast (gadavist)
Comparison: MR pelvis, [DATE], PET-CT, [DATE]

CLINICAL DATA: Status post supracervical hysterectomy with
endometrioid adenocarcinoma of the cervix, restaging

EXAM:
MRI PELVIS WITHOUT AND WITH CONTRAST
TECHNIQUE: Multiplanar multisequence MR imaging of the pelvis was performed
both before and after administration of intravenous contrast.
CONTRAST:  7mL GADAVIST GADOBUTROL 1 MMOL/ML IV SOLN

[Series 2: T2 · coronal · 5.0mm · 1.56mm/px · 2 of 40 slices shown (1 of 3)]
[im 1/40]
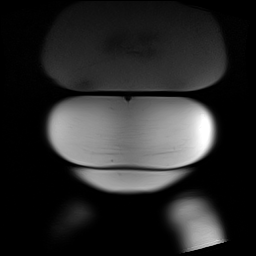
[im 40/40]
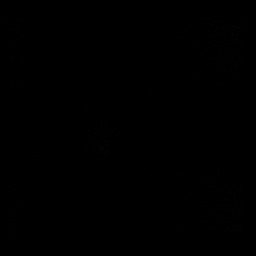

[Series 3: T2 · sagittal · 3.5mm · 0.47mm/px · 2 of 40 slices shown (2 of 3)]
[im 1/40]
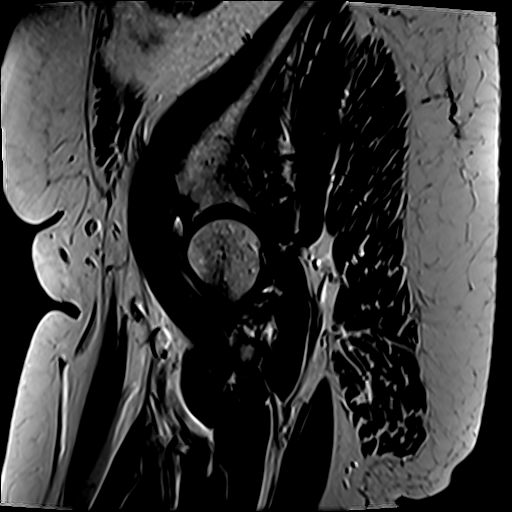
[im 40/40]
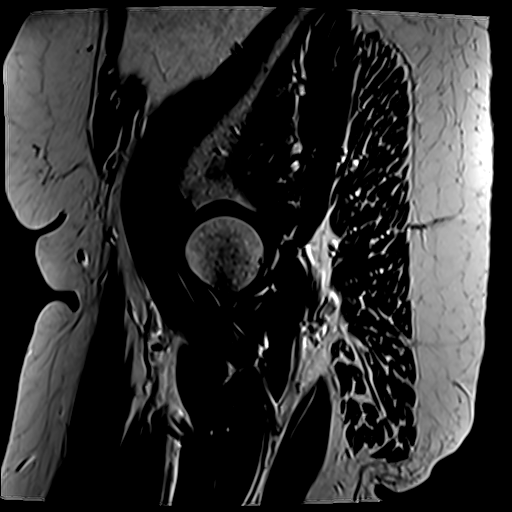

[Series 4: T2 fat-sat · axial · 3.5mm · 0.47mm/px · z∈[-66,+102]mm · 2 of 41 slices shown]
[im 1/41]
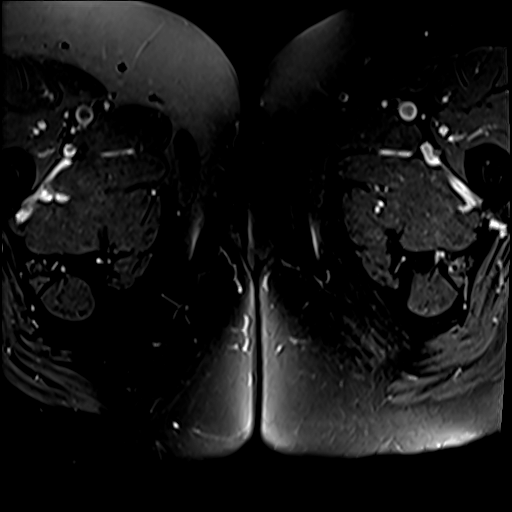
[im 41/41]
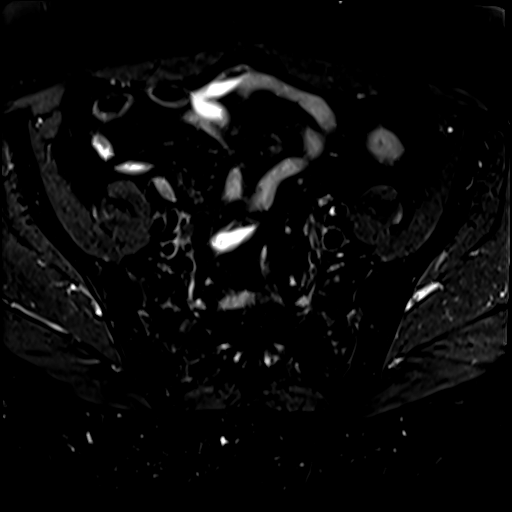

[Series 5: T1 · axial · 3.0mm · 0.84mm/px · z∈[-65,+148]mm · 4 of 72 slices shown (1 of 2)]
[im 1/72]
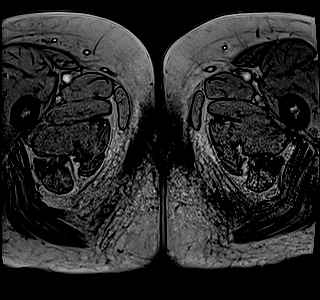
[im 24/72]
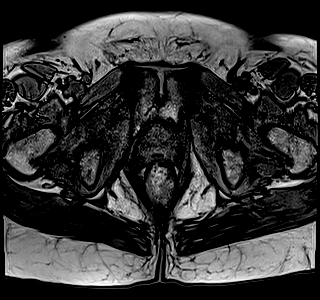
[im 48/72]
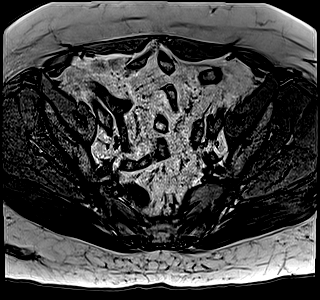
[im 72/72]
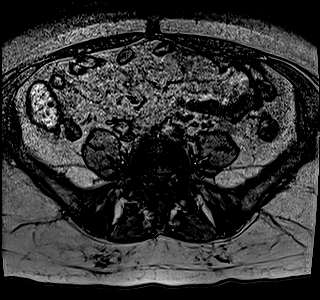

[Series 6: T1 · axial · 3.0mm · 0.84mm/px · z∈[-65,+148]mm · 4 of 72 slices shown (2 of 2)]
[im 1/72]
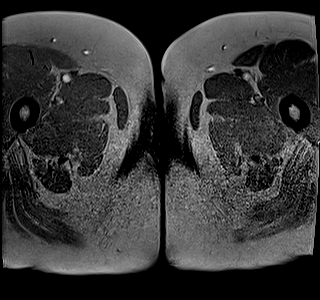
[im 24/72]
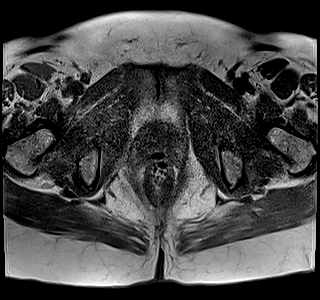
[im 48/72]
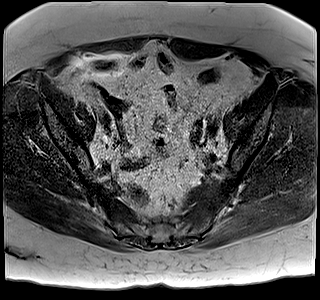
[im 72/72]
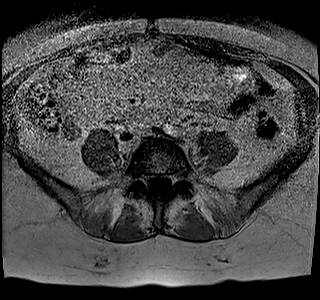

[Series 7: T2 · coronal · 3.0mm · 0.52mm/px · 2 of 31 slices shown (3 of 3)]
[im 1/31]
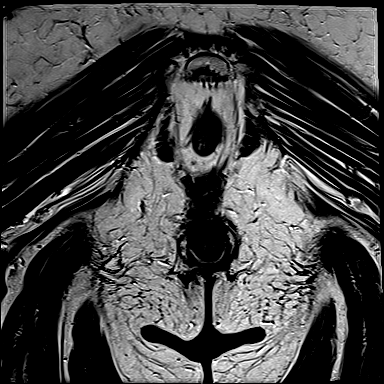
[im 31/31]
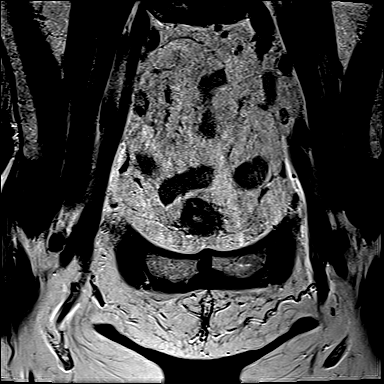

[Series 8: DWI · axial · 3.0mm · 2.19mm/px · z∈[-50,+52]mm · 6 of 101 slices shown (1 of 3)]
[im 1/101]
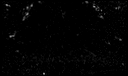
[im 21/101]
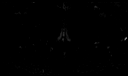
[im 41/101]
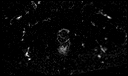
[im 61/101]
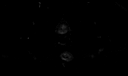
[im 81/101]
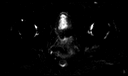
[im 101/101]
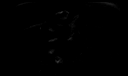

[Series 9: DWI · axial · 3.0mm · 2.19mm/px · z∈[-50,+52]mm · 2 of 35 slices shown (2 of 3)]
[im 1/35]
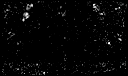
[im 35/35]
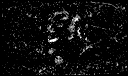

[Series 10: DWI · axial · 3.0mm · 2.19mm/px · z∈[-50,+52]mm · 2 of 31 slices shown (3 of 3)]
[im 1/31]
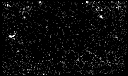
[im 31/31]
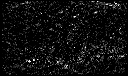

[Series 11: T1 dynamic · coronal · non-contrast · 3.0mm · 1.04mm/px · 2 of 32 slices shown (1 of 6)]
[im 1/32]
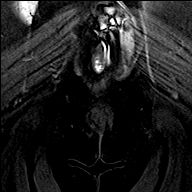
[im 32/32]
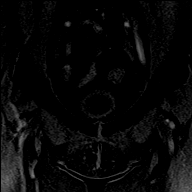

[Series 12: T1 dynamic · coronal · non-contrast · 3.0mm · 1.04mm/px · 2 of 32 slices shown (2 of 6)]
[im 1/32]
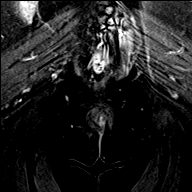
[im 32/32]
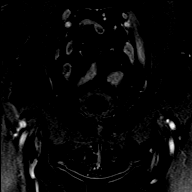

[Series 13: T1 dynamic · coronal · non-contrast · 3.0mm · 1.04mm/px · 2 of 32 slices shown (3 of 6)]
[im 1/32]
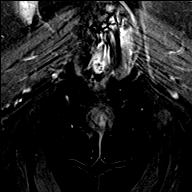
[im 32/32]
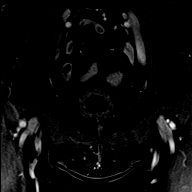

[Series 14: T1 dynamic · coronal · non-contrast · 3.0mm · 1.04mm/px · 2 of 32 slices shown (4 of 6)]
[im 1/32]
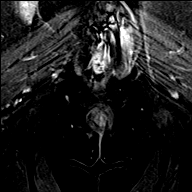
[im 32/32]
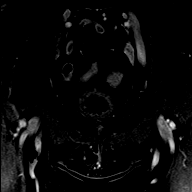

[Series 16: T1 dynamic post-contrast · sagittal · 3.0mm · 0.84mm/px · 4 of 80 slices shown]
[im 1/80]
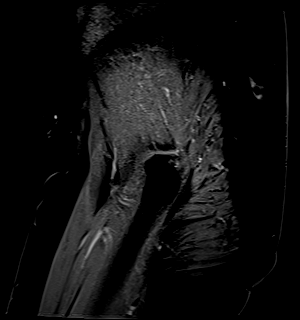
[im 27/80]
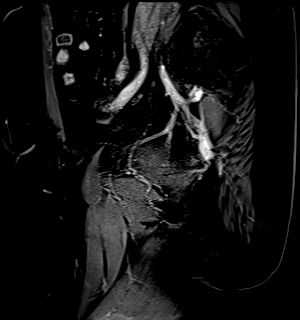
[im 53/80]
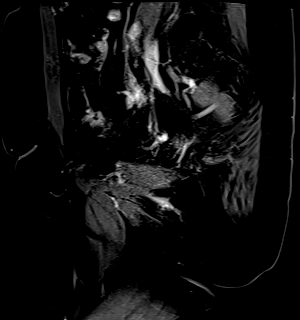
[im 80/80]
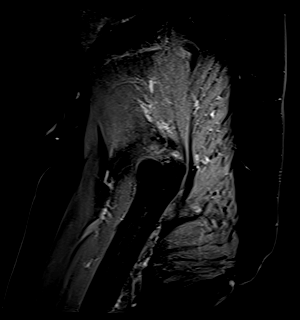

[Series 18: T1 dynamic · axial · 3.0mm · 0.83mm/px · z∈[-79,+158]mm · 4 of 80 slices shown (5 of 6)]
[im 1/80]
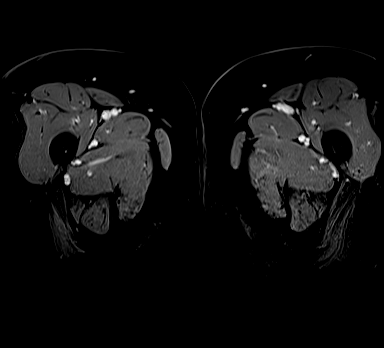
[im 27/80]
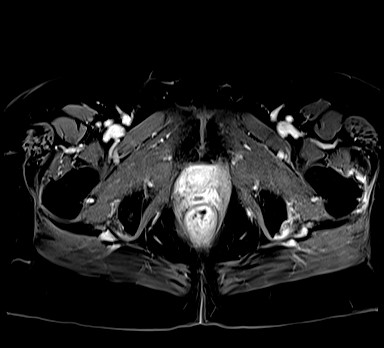
[im 53/80]
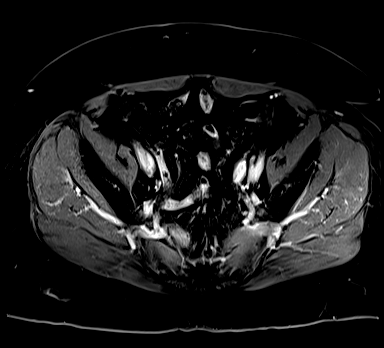
[im 80/80]
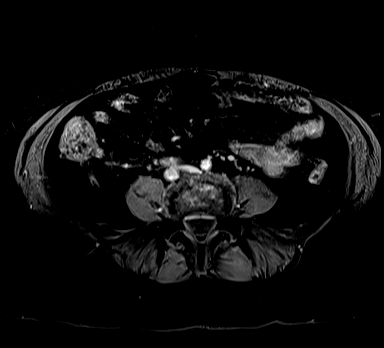

[Series 19: T2 post-contrast · sagittal · 3.5mm · 0.47mm/px · 2 of 40 slices shown]
[im 1/40]
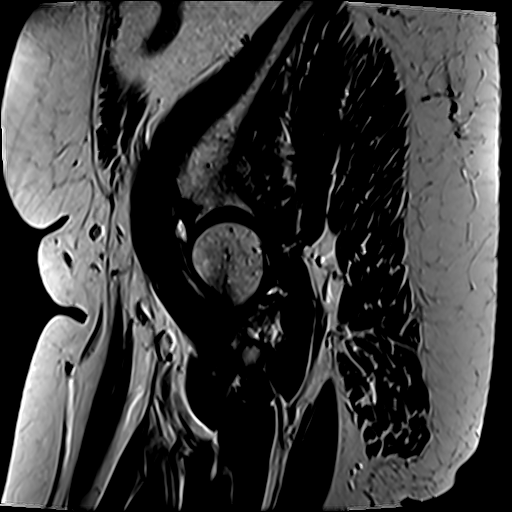
[im 40/40]
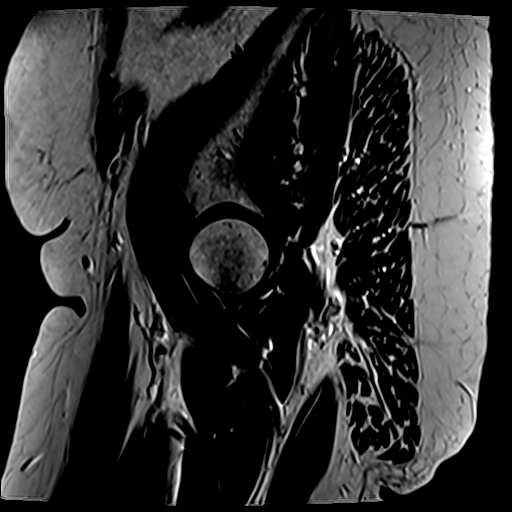

[Series 21: T1 dynamic · coronal · 3.0mm · 0.88mm/px · 4 of 72 slices shown (6 of 6)]
[im 1/72]
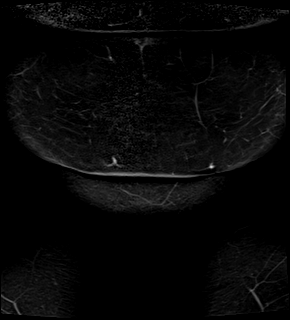
[im 24/72]
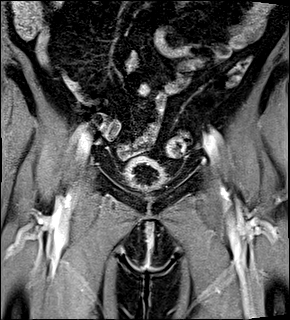
[im 48/72]
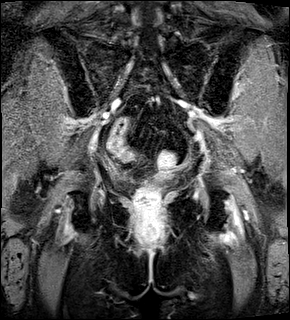
[im 72/72]
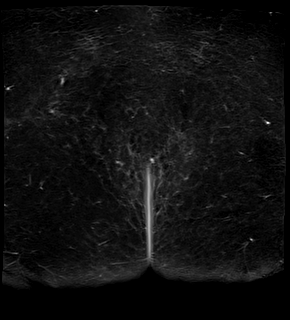

[48 of 48 positions shown; findings below may reference images not displayed]

FINDINGS: Urinary Tract: Unchanged, mild wall thickening of the urinary
bladder.

Bowel:  Unremarkable visualized pelvic bowel loops.

Vascular/Lymphatic: No pathologically enlarged lymph nodes. No
significant vascular abnormality seen.

Reproductive: Status post supracervical hysterectomy. No significant
change in a contrast enhancing mass of the cervical remnant
extending to near the cervical os measuring 3.6 x 2.5 x 2.2 cm,
previously 3.6 x 2.8 x 2.3 cm when measured similarly (series 18,
image 45, series 3, image 20). As previously noted, this is clearly
distinct from the rectum. Surgical clips again somewhat limit
assessment of bladder wall abutment, however there appears to be a
minimal persisting fat plane (series 3, image 20).

Other:  None.

Musculoskeletal: No suspicious bone lesions identified.
IMPRESSION: 1. No significant change in a contrast enhancing mass of the
cervical remnant.
2. No evidence of pelvic lymphadenopathy or metastatic disease.
3. Unchanged, mild wall thickening of the urinary bladder,
nonspecific.

## 2022-05-08 MED ORDER — GADOBUTROL 1 MMOL/ML IV SOLN
7.0000 mL | Freq: Once | INTRAVENOUS | Status: AC | PRN
  Administered 2022-05-08: 7 mL via INTRAVENOUS

## 2022-05-08 MED ORDER — SODIUM CHLORIDE 0.9% FLUSH
10.0000 mL | Freq: Once | INTRAVENOUS | Status: AC
  Administered 2022-05-08: 10 mL

## 2022-05-11 ENCOUNTER — Other Ambulatory Visit: Payer: Self-pay

## 2022-05-11 ENCOUNTER — Encounter (HOSPITAL_BASED_OUTPATIENT_CLINIC_OR_DEPARTMENT_OTHER): Payer: Self-pay | Admitting: Radiation Oncology

## 2022-05-11 NOTE — Progress Notes (Signed)
Spoke w/ via phone for pre-op interview--- pt Lab needs dos----  no             Lab results------ has current lab done 05-04-2022 CBCdiff/ CMP results in epic COVID test -----patient states asymptomatic no test needed Arrive at ------- 0530 on 05-12-2022 NPO after MN NO Solid Food.  Clear liquids from MN until--- 0430 Med rec completed Medications to take morning of surgery ----- wellbutrin, prozac, synthroid, pepcid Diabetic medication ----- n/a Patient instructed no nail polish to be worn day of surgery Patient instructed to bring photo id and insurance card day of surgery Patient aware to have Driver (ride ) / caregiver for 24 hours after surgery --[place], [place] Patient Special Instructions ----- reviewed process after surgery going to cancer center for radiation Pre-Op special Istructions ----- n/a Patient verbalized understanding of instructions that were given at this phone interview. Patient denies shortness of breath, chest pain, fever, cough at this phone interview.

## 2022-05-11 NOTE — Progress Notes (Signed)
  Radiation Oncology         (336) [phone] ________________________________  Name: Elizabeth Bartlett MRN: [id number]  Date: 05/12/2022  DOB: 1961/10/10  CC: [name], MD  Lafonda Mosses, MD  HDR BRACHYTHERAPY NOTE  DIAGNOSIS: The primary encounter diagnosis was Malignant neoplasm of uterus, unspecified site [facility]). A diagnosis of Gynecologic malignancy [facility]) was also pertinent to this visit.   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  (Likely originating in the residual lower uterine segment, after supracervical hysterectomy, with infiltration of the cervical region)  NARRATIVE: The patient was brought to the HDR suite. Identity was confirmed. All relevant records and images related to the planned course of therapy were reviewed. The patient freely provided informed written consent to proceed with treatment after reviewing the details related to the planned course of therapy. The consent form was witnessed and verified by the simulation staff. Then, the patient was set-up in a stable reproducible supine position for radiation therapy. The tandem ring system was accessed and fiducial markers were placed within the tandem and ring.   Simple treatment device note: On the operating room the patient had construction of her custom tandem ring system. She will be treated with a 45 tandem/ring system. The patient had placement of a 20 mm tandem. A cervical ring with a small shielding was used for her treatment. A rectal paddle was also part of her custom set up device.  Verification simulation note: An AP and lateral film was obtained through the pelvis area. This was compared to the patient's planning films documenting accurate position of the tandem/ring system for treatment.  High-dose-rate brachytherapy treatment note:   The remote afterloading device was accessed through catheter system and attached to the tandem ring system. Patient then proceeded to undergo  her first high-dose-rate treatment directed at the cervix. The patient was prescribed a dose of 5.5 gray to be delivered to the [place].Marland Kitchen Patient was treated with 2 channels using 17 dwell positions. Treatment time was 366.8 seconds. The patient tolerated the procedure well. After completion of her therapy, a radiation survey was performed documenting return of the iridium source into the [employer] safe. The patient was then transferred to the nursing suite.  She then had removal of the rectal paddle followed by the tandem and ring system. The patient tolerated the removal well except for discomfort with removal of the ring system  PLAN: After discussion with Dr. Jeral Pinch a decision will be made whether the patient will proceed with definitive radiation therapy or proceed with hysterectomy at this time. ________________________________   -----------------------------------  Blair Promise, PhD, MD  This document serves as a record of services personally performed by Gery Pray, MD. It was created on his behalf by Roney Mans, a trained medical scribe. The creation of this record is based on the scribe's personal observations and the provider's statements to them. This document has been checked and approved by the attending provider.

## 2022-05-12 ENCOUNTER — Encounter (HOSPITAL_BASED_OUTPATIENT_CLINIC_OR_DEPARTMENT_OTHER): Payer: Self-pay | Admitting: Radiation Oncology

## 2022-05-12 ENCOUNTER — Encounter: Payer: Self-pay | Admitting: Radiation Oncology

## 2022-05-12 ENCOUNTER — Ambulatory Visit (HOSPITAL_BASED_OUTPATIENT_CLINIC_OR_DEPARTMENT_OTHER)
Admission: RE | Admit: 2022-05-12 | Discharge: 2022-05-12 | Disposition: A | Discharge status: Other Acute Inpatient Hospital | Payer: BC Managed Care – PPO | Source: Ambulatory Visit | Attending: Radiation Oncology | Admitting: Radiation Oncology

## 2022-05-12 ENCOUNTER — Ambulatory Visit (HOSPITAL_BASED_OUTPATIENT_CLINIC_OR_DEPARTMENT_OTHER): Payer: BC Managed Care – PPO | Admitting: Anesthesiology

## 2022-05-12 ENCOUNTER — Other Ambulatory Visit: Payer: Self-pay

## 2022-05-12 ENCOUNTER — Ambulatory Visit
Admission: RE | Admit: 2022-05-12 | Discharge: 2022-05-12 | Disposition: A | Discharge status: Home / Self Care | Payer: BC Managed Care – PPO | Source: Ambulatory Visit | Attending: Radiation Oncology | Admitting: Radiation Oncology

## 2022-05-12 ENCOUNTER — Encounter (HOSPITAL_BASED_OUTPATIENT_CLINIC_OR_DEPARTMENT_OTHER)
Admission: RE | Disposition: A | Discharge status: Other Acute Inpatient Hospital | Payer: Self-pay | Source: Ambulatory Visit | Attending: Radiation Oncology

## 2022-05-12 ENCOUNTER — Ambulatory Visit (HOSPITAL_COMMUNITY)
Admission: RE | Admit: 2022-05-12 | Discharge: 2022-05-12 | Disposition: A | Discharge status: Home / Self Care | Payer: BC Managed Care – PPO | Source: Ambulatory Visit | Attending: Radiation Oncology | Admitting: Radiation Oncology

## 2022-05-12 ENCOUNTER — Encounter: Payer: Self-pay | Admitting: Hematology and Oncology

## 2022-05-12 VITALS — BP 145/79 | HR 96 | Temp 96.2°F | Resp 18

## 2022-05-12 DIAGNOSIS — C539 Malignant neoplasm of cervix uteri, unspecified: Secondary | ICD-10-CM

## 2022-05-12 DIAGNOSIS — Z79899 Other long term (current) drug therapy: Secondary | ICD-10-CM | POA: Diagnosis not present

## 2022-05-12 DIAGNOSIS — Z853 Personal history of malignant neoplasm of breast: Secondary | ICD-10-CM | POA: Diagnosis not present

## 2022-05-12 DIAGNOSIS — Z01818 Encounter for other preprocedural examination: Secondary | ICD-10-CM

## 2022-05-12 HISTORY — DX: Migraine, unspecified, not intractable, without status migrainosus: G43.909

## 2022-05-12 HISTORY — PX: TANDEM RING INSERTION: SHX6199

## 2022-05-12 HISTORY — DX: Presence of spectacles and contact lenses: Z97.3

## 2022-05-12 HISTORY — DX: Hyperlipidemia, unspecified: E78.5

## 2022-05-12 HISTORY — DX: Other specified abnormal findings of blood chemistry: R79.89

## 2022-05-12 HISTORY — PX: OPERATIVE ULTRASOUND: SHX5996

## 2022-05-12 HISTORY — DX: Nocturia: R35.1

## 2022-05-12 HISTORY — DX: Dysuria: R30.0

## 2022-05-12 HISTORY — DX: Obstructive sleep apnea (adult) (pediatric): G47.33

## 2022-05-12 HISTORY — DX: Personal history of urinary calculi: Z87.442

## 2022-05-12 HISTORY — DX: Personal history of irradiation: Z92.3

## 2022-05-12 HISTORY — DX: Allergic rhinitis, unspecified: J30.9

## 2022-05-12 HISTORY — DX: Unspecified osteoarthritis, unspecified site: M19.90

## 2022-05-12 LAB — RAD ONC ARIA SESSION SUMMARY
Course Elapsed Days: 36
Plan Fractions Treated to Date: 1
Plan Prescribed Dose Per Fraction: 5.5 Gy
Plan Total Fractions Prescribed: 1
Plan Total Prescribed Dose: 5.5 Gy
Reference Point Dosage Given to Date: 10.1228 Gy
Reference Point Dosage Given to Date: 10.5539 Gy
Reference Point Dosage Given to Date: 3.5165 Gy
Reference Point Dosage Given to Date: 3.5207 Gy
Reference Point Dosage Given to Date: 5.5 Gy
Reference Point Session Dosage Given: 10.1228 Gy
Reference Point Session Dosage Given: 10.5539 Gy
Reference Point Session Dosage Given: 3.5165 Gy
Reference Point Session Dosage Given: 3.5207 Gy
Reference Point Session Dosage Given: 5.5 Gy
Session Number: 26

## 2022-05-12 SURGERY — INSERTION, UTERINE TANDEM AND RING OR CYLINDER, FOR BRACHYTHERAPY
Anesthesia: General | Site: Pelvis

## 2022-05-12 MED ORDER — MIDAZOLAM HCL 5 MG/5ML IJ SOLN
INTRAMUSCULAR | Status: DC | PRN
  Administered 2022-05-12: 2 mg via INTRAVENOUS

## 2022-05-12 MED ORDER — ACETAMINOPHEN 500 MG PO TABS
1000.0000 mg | ORAL_TABLET | Freq: Once | ORAL | Status: AC
  Administered 2022-05-12: 1000 mg via ORAL

## 2022-05-12 MED ORDER — FENTANYL CITRATE (PF) 100 MCG/2ML IJ SOLN
INTRAMUSCULAR | Status: DC | PRN
  Administered 2022-05-12 (×4): 25 ug via INTRAVENOUS

## 2022-05-12 MED ORDER — OXYCODONE HCL 5 MG PO TABS: 5.0000 mg | ORAL_TABLET | Freq: Once | ORAL | Status: DC | PRN

## 2022-05-12 MED ORDER — PROPOFOL 10 MG/ML IV BOLUS
INTRAVENOUS | Status: DC | PRN
  Administered 2022-05-12: 150 mg via INTRAVENOUS
  Administered 2022-05-12: 50 mg via INTRAVENOUS

## 2022-05-12 MED ORDER — FENTANYL CITRATE (PF) 100 MCG/2ML IJ SOLN
INTRAMUSCULAR | Status: AC
Start: 2022-05-12 — End: ?
  Filled 2022-05-12: qty 2

## 2022-05-12 MED ORDER — LACTATED RINGERS IV SOLN: INTRAVENOUS | Status: DC

## 2022-05-12 MED ORDER — LACTATED RINGERS IV SOLN
INTRAVENOUS | Status: DC
  Filled 2022-05-12 (×2): qty 250

## 2022-05-12 MED ORDER — EPHEDRINE SULFATE (PRESSORS) 50 MG/ML IJ SOLN
INTRAMUSCULAR | Status: DC | PRN
  Administered 2022-05-12 (×2): 10 mg via INTRAVENOUS

## 2022-05-12 MED ORDER — HYDROMORPHONE HCL 1 MG/ML IJ SOLN
INTRAMUSCULAR | Status: AC
  Filled 2022-05-12: qty 1

## 2022-05-12 MED ORDER — HYDROMORPHONE HCL 1 MG/ML IJ SOLN
0.5000 mg | Freq: Once | INTRAMUSCULAR | Status: AC
  Administered 2022-05-12: 0.5 mg via INTRAVENOUS
  Filled 2022-05-12: qty 1

## 2022-05-12 MED ORDER — FENTANYL CITRATE (PF) 100 MCG/2ML IJ SOLN
25.0000 ug | INTRAMUSCULAR | Status: DC | PRN
  Administered 2022-05-12 (×2): 25 ug via INTRAVENOUS

## 2022-05-12 MED ORDER — DEXAMETHASONE SODIUM PHOSPHATE 4 MG/ML IJ SOLN
INTRAMUSCULAR | Status: DC | PRN
  Administered 2022-05-12: 10 mg via INTRAVENOUS

## 2022-05-12 MED ORDER — OXYCODONE HCL 5 MG/5ML PO SOLN: 5.0000 mg | Freq: Once | ORAL | Status: DC | PRN

## 2022-05-12 MED ORDER — MIDAZOLAM HCL 2 MG/2ML IJ SOLN
INTRAMUSCULAR | Status: AC
  Filled 2022-05-12: qty 2

## 2022-05-12 MED ORDER — EPHEDRINE 5 MG/ML INJ
INTRAVENOUS | Status: AC
  Filled 2022-05-12: qty 5

## 2022-05-12 MED ORDER — FENTANYL CITRATE (PF) 100 MCG/2ML IJ SOLN
INTRAMUSCULAR | Status: AC
  Filled 2022-05-12: qty 2

## 2022-05-12 MED ORDER — SODIUM CHLORIDE 0.9 % IR SOLN
Status: DC | PRN
  Administered 2022-05-12: 1000 mL via INTRAVESICAL

## 2022-05-12 MED ORDER — LIDOCAINE HCL (CARDIAC) PF 100 MG/5ML IV SOSY
PREFILLED_SYRINGE | INTRAVENOUS | Status: DC | PRN
Start: 2022-05-12 — End: 2022-05-12
  Administered 2022-05-12: 100 mg via INTRAVENOUS

## 2022-05-12 MED ORDER — ONDANSETRON HCL 4 MG/2ML IJ SOLN
INTRAMUSCULAR | Status: DC | PRN
  Administered 2022-05-12: 4 mg via INTRAVENOUS

## 2022-05-12 MED ORDER — KETOROLAC TROMETHAMINE 30 MG/ML IJ SOLN
INTRAMUSCULAR | Status: DC | PRN
  Administered 2022-05-12: 30 mg via INTRAVENOUS

## 2022-05-12 MED ORDER — ACETAMINOPHEN 500 MG PO TABS
ORAL_TABLET | ORAL | Status: AC
  Filled 2022-05-12: qty 2

## 2022-05-12 MED ORDER — ONDANSETRON HCL 4 MG/2ML IJ SOLN: 4.0000 mg | Freq: Once | INTRAMUSCULAR | Status: DC | PRN

## 2022-05-12 MED ORDER — DEXAMETHASONE SODIUM PHOSPHATE 10 MG/ML IJ SOLN
INTRAMUSCULAR | Status: AC
  Filled 2022-05-12: qty 1

## 2022-05-12 MED ORDER — AMISULPRIDE (ANTIEMETIC) 5 MG/2ML IV SOLN: 10.0000 mg | Freq: Once | INTRAVENOUS | Status: DC | PRN

## 2022-05-12 MED ORDER — PROPOFOL 10 MG/ML IV BOLUS
INTRAVENOUS | Status: AC
  Filled 2022-05-12: qty 20

## 2022-05-12 MED ORDER — LIDOCAINE HCL (PF) 2 % IJ SOLN
INTRAMUSCULAR | Status: AC
  Filled 2022-05-12: qty 5

## 2022-05-12 MED ORDER — KETOROLAC TROMETHAMINE 30 MG/ML IJ SOLN
INTRAMUSCULAR | Status: AC
  Filled 2022-05-12: qty 1

## 2022-05-12 MED ORDER — ONDANSETRON HCL 4 MG/2ML IJ SOLN
INTRAMUSCULAR | Status: AC
  Filled 2022-05-12: qty 2

## 2022-05-12 SURGICAL SUPPLY — 19 items
BNDG CONFORM 2 STRL LF (GAUZE/BANDAGES/DRESSINGS) IMPLANT
DILATOR CANAL MILEX (MISCELLANEOUS) IMPLANT
DRSG PAD ABDOMINAL 8X10 ST (GAUZE/BANDAGES/DRESSINGS) ×3 IMPLANT
DRSG TELFA 3X8 NADH (GAUZE/BANDAGES/DRESSINGS) ×3 IMPLANT
GAUZE 4X4 16PLY ~~LOC~~+RFID DBL (SPONGE) ×6 IMPLANT
GLOVE BIO SURGEON STRL SZ7.5 (GLOVE) ×6 IMPLANT
GOWN STRL REUS W/TWL LRG LVL3 (GOWN DISPOSABLE) ×3 IMPLANT
HOLDER FOLEY CATH W/STRAP (MISCELLANEOUS) ×3 IMPLANT
IV NS 1000ML (IV SOLUTION) ×3
IV NS 1000ML BAXH (IV SOLUTION) ×2 IMPLANT
IV SET EXTENSION GRAVITY 40 LF (IV SETS) ×3 IMPLANT
KIT TURNOVER CYSTO (KITS) ×3 IMPLANT
MAT PREVALON FULL STRYKER (MISCELLANEOUS) ×3 IMPLANT
PACK VAGINAL MINOR WOMEN LF (CUSTOM PROCEDURE TRAY) ×3 IMPLANT
PACKING VAGINAL (PACKING) IMPLANT
PAD DRESSING TELFA 3X8 NADH (GAUZE/BANDAGES/DRESSINGS) ×2 IMPLANT
TOWEL OR 17X26 10 PK STRL BLUE (TOWEL DISPOSABLE) ×3 IMPLANT
TRAY FOLEY W/BAG SLVR 14FR LF (SET/KITS/TRAYS/PACK) ×3 IMPLANT
WATER STERILE IRR 500ML POUR (IV SOLUTION) ×3 IMPLANT

## 2022-05-12 NOTE — Anesthesia Postprocedure Evaluation (Signed)
Anesthesia Post Note  Patient: Elizabeth Bartlett  Procedure(s) Performed: TANDEM RING INSERTION (Cervix) OPERATIVE ULTRASOUND (Pelvis)     Patient location during evaluation: PACU Anesthesia Type: General Level of consciousness: awake and alert Pain management: pain level controlled Vital Signs Assessment: post-procedure vital signs reviewed and stable Respiratory status: spontaneous breathing, nonlabored ventilation and respiratory function stable Cardiovascular status: blood pressure returned to baseline and stable Postop Assessment: no apparent nausea or vomiting Anesthetic complications: no   No notable events documented.  Last Vitals:  Vitals:   05/12/22 0900 05/12/22 0915  BP: 140/71 139/74  Pulse: 94 95  Resp: (!) 22 12  Temp:    SpO2: 94% 93%    Last Pain:  Vitals:   05/12/22 0915  TempSrc:   PainSc: 3                  Lidia Collum

## 2022-05-12 NOTE — Patient Instructions (Signed)
IMMEDIATELY FOLLOWING SURGERY: Do not drive or operate machinery for the first twenty four hours after surgery. Do not make any important decisions for twenty four hours after surgery or while taking narcotic pain medications or sedatives. If you develop intractable nausea and vomiting or a severe headache please notify your doctor immediately.   FOLLOW-UP: You do not need to follow up with anesthesia unless specifically instructed to do so.   WOUND CARE INSTRUCTIONS (if applicable): Expect some mild vaginal bleeding, but if large amount of bleeding occurs please contact Dr. Sondra Come at [phone] or the Radiation On-Call physician. Call for any fever greater than 101.0 degrees or increasing vaginal//abdominal pain or trouble urinating.   QUESTIONS?: Please feel free to call your physician or the hospital operator if you have any questions, and they will be happy to assist you. Resume all medications: as listed on your after visit summary. Your next appointment is:  Future Appointments  Date Time Provider [place]  06/01/2022 10:30 AM [facility] 1 [facility] [place]

## 2022-05-12 NOTE — Op Note (Signed)
05/12/2022  8:58 AM  PATIENT:  Elizabeth Bartlett  61 y.o. female  PRE-OPERATIVE DIAGNOSIS:  CERVICAL CANCER  POST-OPERATIVE DIAGNOSIS:  CERVICAL CANCER  PROCEDURE:  Procedure(s): TANDEM RING INSERTION (N/A) OPERATIVE ULTRASOUND (N/A)  SURGEON:  Surgeon(s) and Role:    * Gery Pray, MD - Primary    * Lafonda Mosses, MD - Assisting  PHYSICIAN ASSISTANT:   ASSISTANTS: none   ANESTHESIA:   general  EBL:  5 mL   BLOOD ADMINISTERED:none  DRAINS: Urinary Catheter (Foley)   LOCAL MEDICATIONS USED:  NONE  SPECIMEN:  No Specimen  DISPOSITION OF SPECIMEN:  N/A  COUNTS:  YES  TOURNIQUET:  * No tourniquets in log *  DICTATION: The patient was taken to outpatient OR room #1 at the [facility] long surgical center.  A general anesthetic was applied.  The patient then proceeded to undergo pelvic examination by Dr. Berline Lopes.  The remnant cervical mass had not decreased significantly in size based on intraoperative examination.  In addition on recent MRI of the pelvis the remnant mass had not changed appreciably in size. A Foley catheter placed.  The patient was prepped and draped in the usual sterile fashion placed in the dorsolithotomy position.  Timeout for the procedure, preoperative medications, allergies was performed prior to initiation of the procedure.   The bladder was backfilled with approximately 200 cc of sterile water for ultrasound imaging purposes.  The patient then underwent additional exam under anesthesia by myself.  The remnant lower uterine segment/cervical mass had not changed appreciably in size.  There was some erythema to the cervical region but no visible lesion on the ectocervix.  The remnant mass remained firm on examination.  Patient then proceeded to undergo transabdominal ultrasound.  The remnant mass was estimated to be 3.5 cm x 2.6 cm.  This was very similar to measurements obtained from the patient's MRI.  It was difficult to determine if there was any rectosigmoid  colon attached to the upper portion of the remnant mass due to artifact.  Patient then proceeded to undergo dilation and sounding of the remnant mass.  The mass sounded to approximately 4 cm.  Initially a 4 cm 45 degree tandem was placed but this was too long and the cervical sleeve was not fitting flush with the ectocervix.  She then had placement of a 20 mm cervical sleeve with a 20 mm 45 degree tandem.  This was then followed by placement of a 45 degree ring with a small shielding cap in place.  Posteriorly a rectal bladder was placed and attached to the above equipment securely.  Transabdominal ultrasound at completion of the procedure showed accurate placement of the tandem ring for anticipated high-dose-rate radiation therapy.  Patient tolerated the procedure well.  She was transported to the recovery room in stable condition.  Later in the morning the patient will be brought down to radiation oncology for planning and her high-dose-rate treatment.  She is to receive approximately 5.5 Pearline Cables to the high risk clinical target volume.  Iridium 192 will be the high-dose-rate source.  PLAN OF CARE:  Transfer to radiation oncology for planning and treatment  PATIENT DISPOSITION:  PACU - hemodynamically stable.   Delay start of Pharmacological VTE agent (>24hrs) due to surgical blood loss or risk of bleeding: not applicable

## 2022-05-12 NOTE — Anesthesia Preprocedure Evaluation (Signed)
Anesthesia Evaluation  Patient identified by MRN, date of birth, ID band Patient awake    Reviewed: Allergy & Precautions, NPO status , Patient's Chart, lab work & pertinent test results  History of Anesthesia Complications Negative for: history of anesthetic complications  Airway Mallampati: II  TM Distance: >3 FB Neck ROM: Full    Dental  (+) Teeth Intact, Dental Advisory Given   Pulmonary sleep apnea ,    Pulmonary exam normal        Cardiovascular negative cardio ROS Normal cardiovascular exam     Neuro/Psych Anxiety Depression negative neurological ROS     GI/Hepatic negative GI ROS, Neg liver ROS,   Endo/Other  Hypothyroidism   Renal/GU negative Renal ROS  negative genitourinary   Musculoskeletal  (+) Arthritis ,   Abdominal   Peds  Hematology negative hematology ROS (+) H/o breast cancer   Anesthesia Other Findings   Reproductive/Obstetrics Cervical cancer                            Anesthesia Physical Anesthesia Plan  ASA: 3  Anesthesia Plan: General   Post-op Pain Management: Tylenol PO (pre-op)* and Toradol IV (intra-op)*   Induction: Intravenous  PONV Risk Score and Plan: 3 and Ondansetron, Dexamethasone, Midazolam and Treatment may vary due to age or medical condition  Airway Management Planned: LMA  Additional Equipment: None  Intra-op Plan:   Post-operative Plan: Extubation in OR  Informed Consent: I have reviewed the patients History and Physical, chart, labs and discussed the procedure including the risks, benefits and alternatives for the proposed anesthesia with the patient or authorized representative who has indicated his/her understanding and acceptance.     Dental advisory given  Plan Discussed with:   Anesthesia Plan Comments:         Anesthesia Quick Evaluation

## 2022-05-12 NOTE — Discharge Instructions (Signed)
  Post Anesthesia Home Care Instructions  Activity: Get plenty of rest for the remainder of the day. A responsible individual must stay with you for 24 hours following the procedure.  For the next 24 hours, DO NOT: -Drive a car -Paediatric nurse -Drink alcoholic beverages -Take any medication unless instructed by your physician -Make any legal decisions or sign important papers.  Meals: Start with liquid foods such as gelatin or soup. Progress to regular foods as tolerated. Avoid greasy, spicy, heavy foods. If nausea and/or vomiting occur, drink only clear liquids until the nausea and/or vomiting subsides. Call your physician if vomiting continues.  Special Instructions/Symptoms: Your throat may feel dry or sore from the anesthesia or the breathing tube placed in your throat during surgery. If this causes discomfort, gargle with warm salt water. The discomfort should disappear within 24 hours.  No acetaminophen/Tylenol until after 1 pm today if needed.

## 2022-05-12 NOTE — Anesthesia Procedure Notes (Signed)
Procedure Name: LMA Insertion Date/Time: 05/12/2022 7:41 AM Performed by: Justice Rocher, CRNA Pre-anesthesia Checklist: Patient identified, Emergency Drugs available, Suction available, Patient being monitored and Timeout performed Patient Re-evaluated:Patient Re-evaluated prior to induction Oxygen Delivery Method: Circle system utilized Preoxygenation: Pre-oxygenation with 100% oxygen Induction Type: IV induction Ventilation: Mask ventilation without difficulty LMA: LMA inserted LMA Size: 4.0 Number of attempts: 1 Airway Equipment and Method: Bite block Placement Confirmation: positive ETCO2, breath sounds checked- equal and bilateral and CO2 detector Tube secured with: Tape Dental Injury: Teeth and Oropharynx as per pre-operative assessment

## 2022-05-12 NOTE — Transfer of Care (Signed)
Immediate Anesthesia Transfer of Care Note  Patient: Elizabeth Bartlett  Procedure(s) Performed: Procedure(s) (LRB): TANDEM RING INSERTION (N/A) OPERATIVE ULTRASOUND (N/A)  Patient Location: PACU  Anesthesia Type: General  Level of Consciousness: awake, sedated, patient cooperative and responds to stimulation  Airway & Oxygen Therapy: Patient Spontanous Breathing and Patient connected to face [place] 02   Post-op Assessment: Report given to PACU RN, Post -op Vital signs reviewed and stable and Patient moving all extremities  Post vital signs: Reviewed and stable  Complications: No apparent anesthesia complications

## 2022-05-12 NOTE — Interval H&P Note (Signed)
History and Physical Interval Note:  05/12/2022 7:18 AM  Eather Colas Man  has presented today for surgery, with the diagnosis of CERVICAL CANCER.  The various methods of treatment have been discussed with the patient and family. After consideration of risks, benefits and other options for treatment, the patient has consented to  Procedure(s): TANDEM RING INSERTION (N/A) OPERATIVE ULTRASOUND (N/A) as a surgical intervention.  The patient's history has been reviewed, patient examined, no change in status, stable for surgery.  I have reviewed the patient's chart and labs.  Questions were answered to the patient's satisfaction.     Gery Pray

## 2022-05-13 ENCOUNTER — Encounter (HOSPITAL_BASED_OUTPATIENT_CLINIC_OR_DEPARTMENT_OTHER): Payer: Self-pay | Admitting: Radiation Oncology

## 2022-05-14 ENCOUNTER — Telehealth: Payer: Self-pay

## 2022-05-14 ENCOUNTER — Telehealth: Payer: Self-pay | Admitting: Gynecologic Oncology

## 2022-05-14 NOTE — Telephone Encounter (Signed)
Called the patient again.   Discussed MRI findings, findings during surgery. Her cervical tumor unfortunately has not changed in size. My fear is that we may not be able to achieve complete response even with HDR.   From a treatment standpoint, we discussed multiple options. We discussed surgery again, which we would plan to do about 6 weeks after radiation. This would include a trachelectomy, possible bowel resection. Discussed findings of sigmoid colon in same place over the cervical stump, which may indicate that it is adherent to the cervix. Given radiation, if sigmoid adherent to the cervix, it may necessitate resection of the sigmoid in that area. Would ideally plan for reanastomosis if she required a bowel resection +/- diverting ostomy. There would be a risk that she would need an end ostomy. I also discussed imaging findings that show some concern for loss of plane between the bladder and the cervix.   We also discussed the possibility of continuing with HDR and likely need for systemic treatment after. Her MMR IHC was abnormal, MSI still appears not to have been performed (my office called outside hospital again today to request MSI testing). 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  After our discussion today, she is thinking she'd like to move forward with surgery. I offered that we could plan to attempt this robotically with need for laparotomy if bowel resection indicated. Alternatively, I could discuss with one of our colorectal surgeons the possibility of joint surgery (to be able to perform any bowel surgery robotically). She is interested in pursuing the second option if possible.   I've asked her to call my office on Tuesday to let me know her final decision with regard to treatment.  Jeral Pinch MD Gynecologic Oncology

## 2022-05-14 NOTE — Telephone Encounter (Signed)
Patient called to ask if she would be having any more tandem ring procedures. Patient requesting a call from Dr. Sondra Come.

## 2022-05-14 NOTE — Telephone Encounter (Signed)
Called patient to discuss treatment options moving forward. No answer. Left message with callback requested.  Jeral Pinch MD Gynecologic Oncology

## 2022-05-15 ENCOUNTER — Telehealth: Payer: Self-pay | Admitting: *Deleted

## 2022-05-15 NOTE — Telephone Encounter (Signed)
CALLED PATIENT TO ASK ABOUT SCHEDULING 1 MONTH FU, PATIENT AGREED TO COME ON 06-15-22 @ 11:15 AM

## 2022-05-20 ENCOUNTER — Other Ambulatory Visit: Payer: Self-pay | Admitting: Gynecologic Oncology

## 2022-05-20 ENCOUNTER — Telehealth: Payer: Self-pay

## 2022-05-20 DIAGNOSIS — C579 Malignant neoplasm of female genital organ, unspecified: Secondary | ICD-10-CM

## 2022-05-20 NOTE — Telephone Encounter (Signed)
Following up with patient regarding her decision for treatment. Patient reports she would like to proceed with surgery. She received a call yesterday to arrange her pre-admission appointment and labs on 06/15/22.  Advised patient we will also need to schedule a pre-op appointment with Joylene John, NP. This has been scheduled after the patients CT scan on 06/01/22 at 12:45pm. Patient is in agreement of appointment date and time. Instructed that our office will reach out to [facility] to possibly have a joint surgery with the colorectal surgeon. Patient verbalized understanding and did not voice any questions. Instructed to call with any needs.

## 2022-05-25 ENCOUNTER — Other Ambulatory Visit: Payer: Self-pay | Admitting: Hematology and Oncology

## 2022-06-01 ENCOUNTER — Encounter (HOSPITAL_COMMUNITY): Payer: Self-pay

## 2022-06-01 ENCOUNTER — Other Ambulatory Visit: Payer: Self-pay

## 2022-06-01 ENCOUNTER — Inpatient Hospital Stay: Payer: BC Managed Care – PPO | Attending: Gynecologic Oncology

## 2022-06-01 ENCOUNTER — Telehealth: Payer: Self-pay | Admitting: *Deleted

## 2022-06-01 ENCOUNTER — Inpatient Hospital Stay (HOSPITAL_BASED_OUTPATIENT_CLINIC_OR_DEPARTMENT_OTHER): Payer: BC Managed Care – PPO | Admitting: Gynecologic Oncology

## 2022-06-01 ENCOUNTER — Ambulatory Visit (HOSPITAL_COMMUNITY)
Admission: RE | Admit: 2022-06-01 | Discharge: 2022-06-01 | Disposition: A | Discharge status: Home / Self Care | Payer: BC Managed Care – PPO | Source: Ambulatory Visit | Attending: Hematology and Oncology | Admitting: Hematology and Oncology

## 2022-06-01 VITALS — BP 158/82 | HR 92 | Temp 97.9°F | Resp 18 | Ht 60.0 in | Wt 167.0 lb

## 2022-06-01 DIAGNOSIS — C530 Malignant neoplasm of endocervix: Secondary | ICD-10-CM | POA: Insufficient documentation

## 2022-06-01 DIAGNOSIS — C50811 Malignant neoplasm of overlapping sites of right female breast: Secondary | ICD-10-CM

## 2022-06-01 DIAGNOSIS — C539 Malignant neoplasm of cervix uteri, unspecified: Secondary | ICD-10-CM

## 2022-06-01 LAB — COMPREHENSIVE METABOLIC PANEL
ALT: 46 U/L — ABNORMAL HIGH (ref 0–44)
AST: 28 U/L (ref 15–41)
Albumin: 4 g/dL (ref 3.5–5.0)
Alkaline Phosphatase: 185 U/L — ABNORMAL HIGH (ref 38–126)
Anion gap: 7 (ref 5–15)
BUN: 12 mg/dL (ref 8–23)
CO2: 30 mmol/L (ref 22–32)
Calcium: 9.3 mg/dL (ref 8.9–10.3)
Chloride: 103 mmol/L (ref 98–111)
Creatinine, Ser: 0.76 mg/dL (ref 0.44–1.00)
GFR, Estimated: >60 mL/min (ref >60)
Glucose, Bld: 100 mg/dL — ABNORMAL HIGH (ref 70–99)
Potassium: 4 mmol/L (ref 3.5–5.1)
Sodium: 140 mmol/L (ref 135–145)
Total Bilirubin: 0.4 mg/dL (ref 0.3–1.2)
Total Protein: 6.5 g/dL (ref 6.5–8.1)

## 2022-06-01 IMAGING — CT CT ABD-PELV W/ CM
2 of 5 series · 16 of 46 positions shown, 18 images · IV contrast (OMNIPAQUE)
Comparison: PET-CT dated [DATE]; CT abdomen and pelvis
dated [DATE]

CLINICAL DATA: Uterine/cervical cancer, assess treatment response;
* Tracking Code: BO *

EXAM:
CT ABDOMEN AND PELVIS WITH CONTRAST
TECHNIQUE: Multidetector CT imaging of the abdomen and pelvis was performed
using the standard protocol following bolus administration of
intravenous contrast.

[Series 2: axial st · axial · 0.68mm/px · z∈[-458,-48]mm · 13 of 96 slices shown, 15 images]
[im 7/96  soft-tissue]
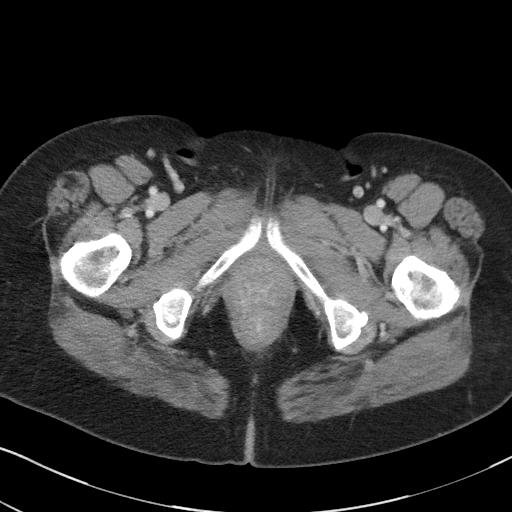
[im 7/96  bone]
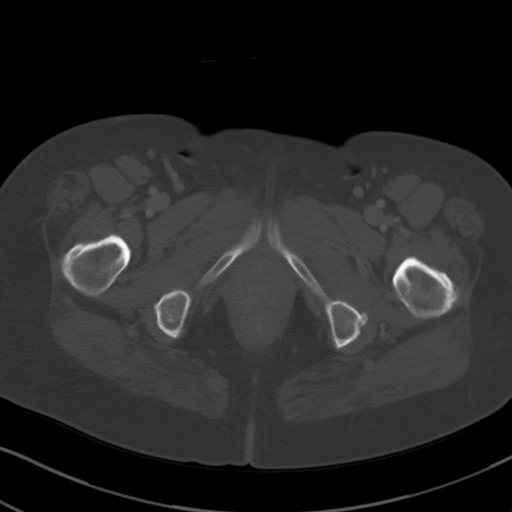
[im 14/96  soft-tissue]
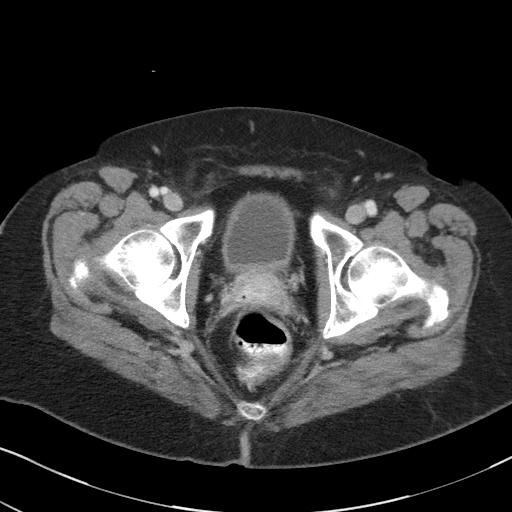
[im 21/96  soft-tissue]
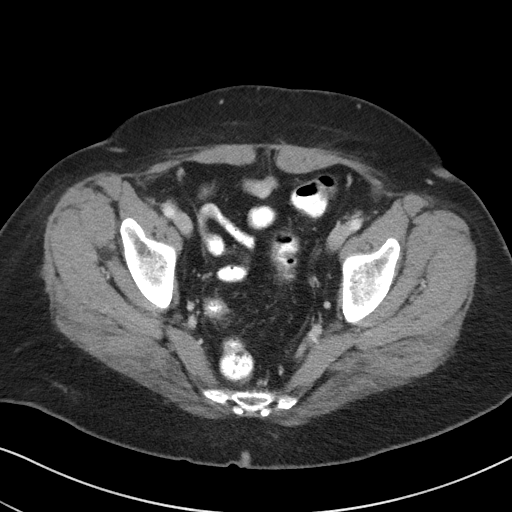
[im 28/96  soft-tissue]
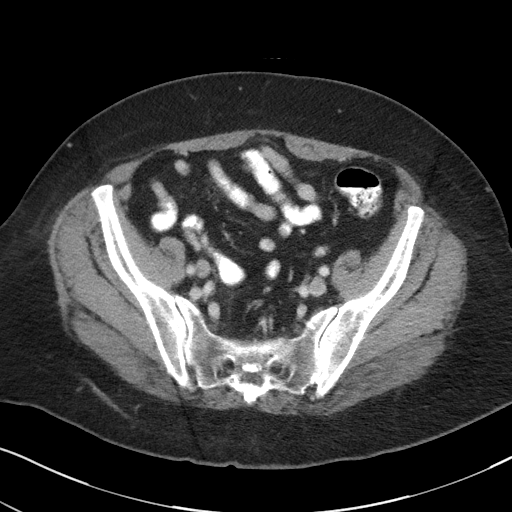
[im 34/96  soft-tissue]
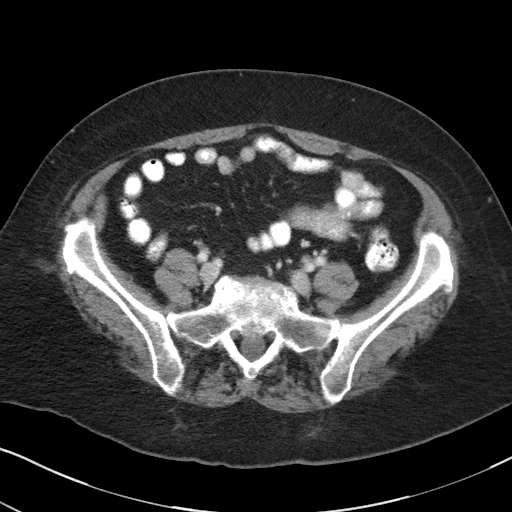
[im 41/96  soft-tissue]
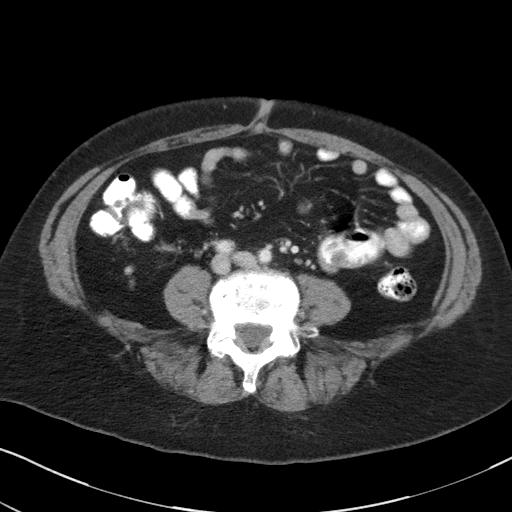
[im 48/96  soft-tissue]
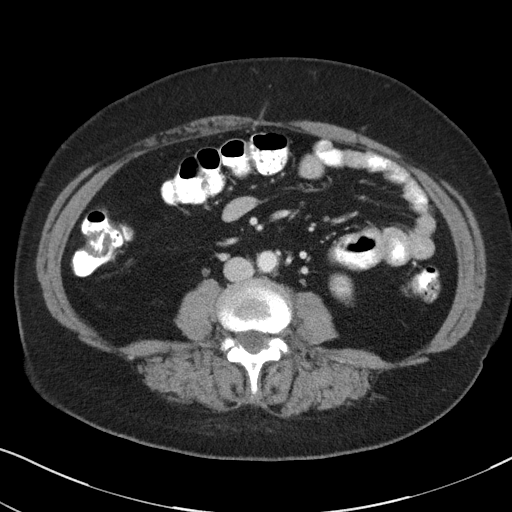
[im 55/96  soft-tissue]
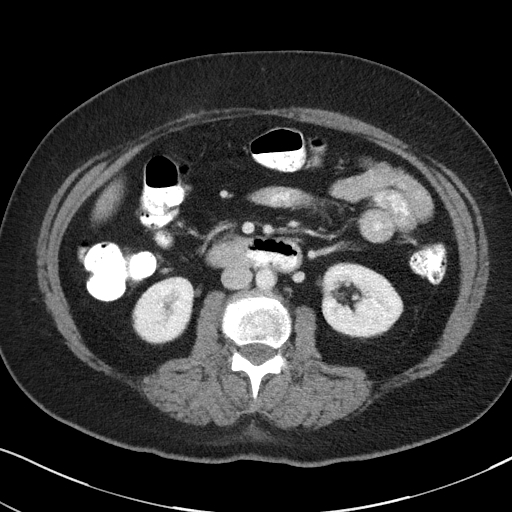
[im 62/96  soft-tissue]
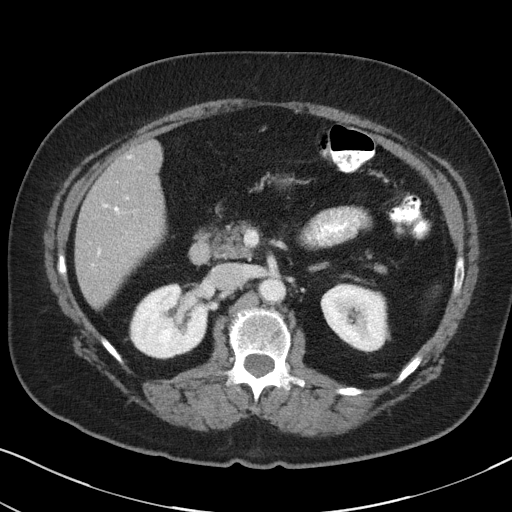
[im 62/96  bone]
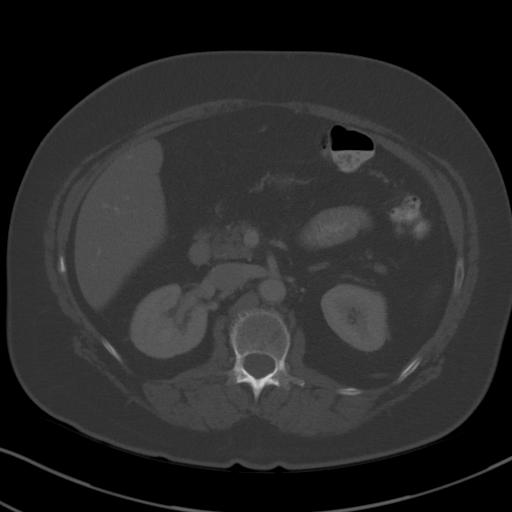
[im 68/96  soft-tissue]
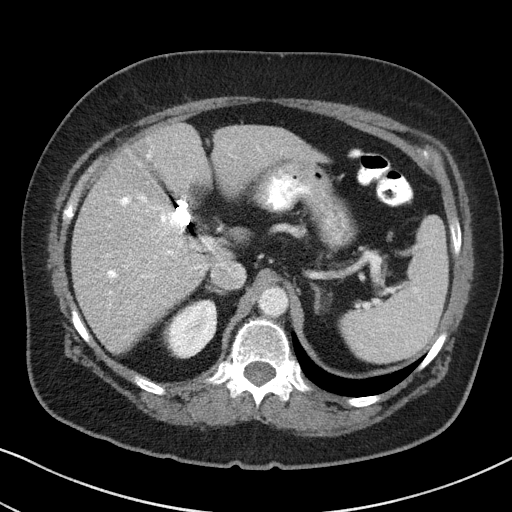
[im 75/96  soft-tissue]
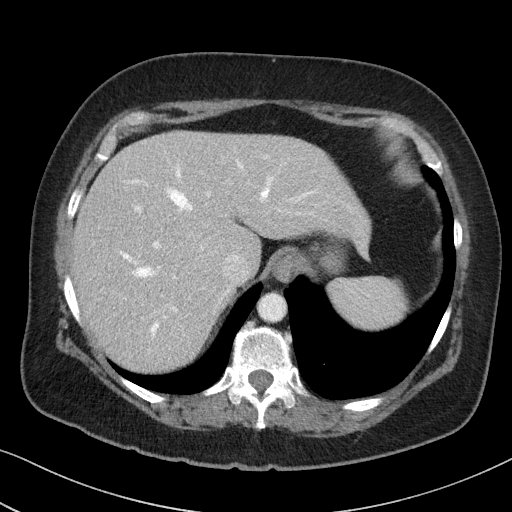
[im 82/96  soft-tissue]
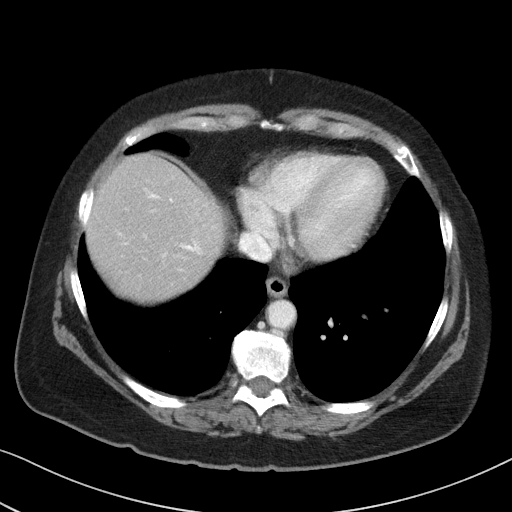
[im 89/96  soft-tissue]
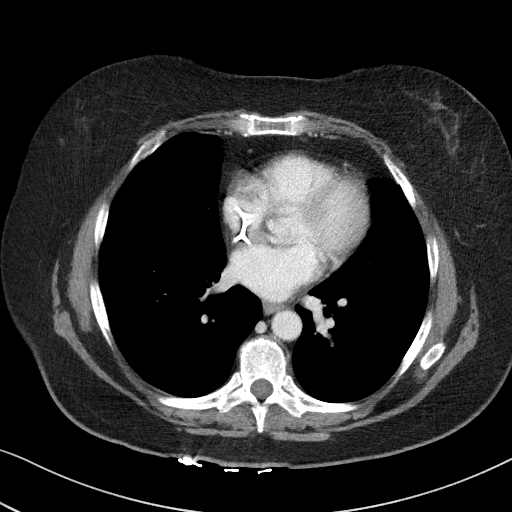

[Series 4: coronal st · coronal · 0.76mm/px · 3 of 82 slices shown]
[im 28/82  soft-tissue]
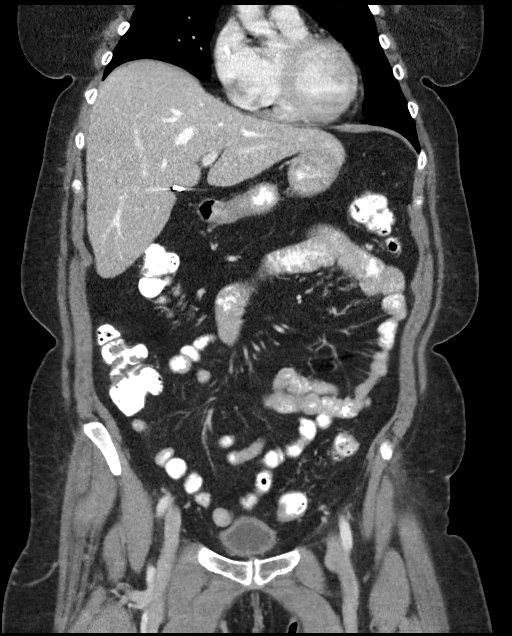
[im 37/82  soft-tissue]
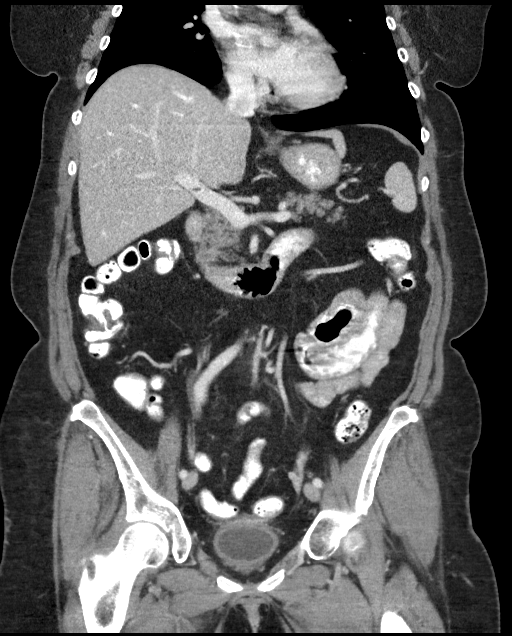
[im 46/82  soft-tissue]
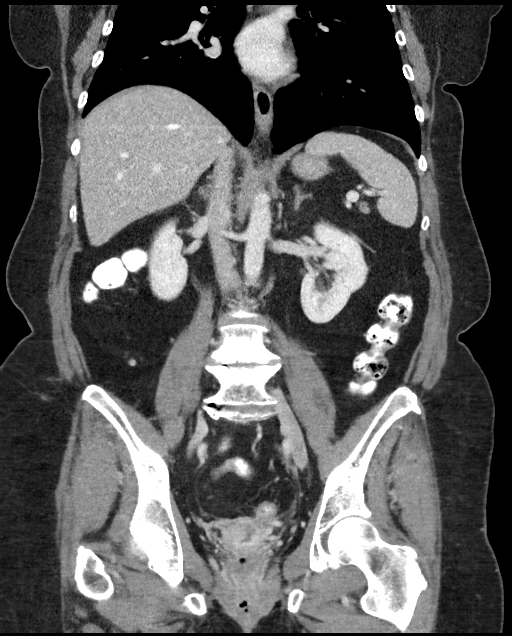

[16 of 46 positions shown; findings below may reference images not displayed]

RADIATION DOSE REDUCTION: This exam was performed according to the
departmental dose-optimization program which includes automated
exposure control, adjustment of the mA and/or kV according to
patient size and/or use of iterative reconstruction technique.

CONTRAST:  100mL OMNIPAQUE IOHEXOL 300 MG/ML  SOLN
FINDINGS: Lower chest: No acute abnormality.

Hepatobiliary: No focal liver abnormality is seen. Status post
cholecystectomy. No biliary dilatation.

Pancreas: Unremarkable. No pancreatic ductal dilatation or
surrounding inflammatory changes.

Spleen: Normal in size without focal abnormality.

Adrenals/Urinary Tract: Adrenal glands are unremarkable. Kidneys are
normal, without renal calculi, focal lesion, or hydronephrosis.
Bladder is unremarkable.

Stomach/Bowel: Stomach is within normal limits. Appendix appears
normal. No evidence of bowel wall thickening, distention, or
inflammatory changes.

Vascular/Lymphatic: Aortic atherosclerosis. No enlarged abdominal or
pelvic lymph nodes.

Reproductive: Prior hysterectomy. Mass of the cervical remnant
demonstrates new central low attenuation which is likely due to
necrosis. Mass measures 3.7 x 2.9 cm, unchanged when compared with
prior exam and remeasured in similar plane.

Other: No abdominal wall hernia or abnormality. No abdominopelvic
ascites.

Musculoskeletal: Stable small sclerotic lesions of the right iliac
bone and right sacrum, likely bone islands. Moderate degenerative
disc disease at L4-L5 endplate sclerosis. No aggressive appearing
osseous lesions.
IMPRESSION: 1. Mass of the cervical remnant is unchanged in size, but
demonstrates new central low attenuation which is likely due to
necrosis.
2. No evidence of metastatic disease in the abdomen or pelvis.
3.  Aortic Atherosclerosis ([U1]-[U1]).

## 2022-06-01 MED ORDER — SODIUM CHLORIDE 0.9% FLUSH
10.0000 mL | Freq: Once | INTRAVENOUS | Status: AC
  Administered 2022-06-01: 10 mL

## 2022-06-01 MED ORDER — SENNOSIDES-DOCUSATE SODIUM 8.6-50 MG PO TABS: 2.0000 | ORAL_TABLET | Freq: Every day | ORAL | 0 refills | Status: DC

## 2022-06-01 MED ORDER — HEPARIN SOD (PORK) LOCK FLUSH 100 UNIT/ML IV SOLN
500.0000 [IU] | Freq: Once | INTRAVENOUS | Status: AC
  Administered 2022-06-01: 500 [IU] via INTRAVENOUS

## 2022-06-01 MED ORDER — IOHEXOL 300 MG/ML  SOLN
100.0000 mL | Freq: Once | INTRAMUSCULAR | Status: AC | PRN
Start: 2022-06-01 — End: 2022-06-01
  Administered 2022-06-01: 100 mL via INTRAVENOUS

## 2022-06-01 MED ORDER — SODIUM CHLORIDE (PF) 0.9 % IJ SOLN
INTRAMUSCULAR | Status: AC
  Filled 2022-06-01: qty 50

## 2022-06-01 MED ORDER — HEPARIN SOD (PORK) LOCK FLUSH 100 UNIT/ML IV SOLN
INTRAVENOUS | Status: AC
  Filled 2022-06-01: qty 5

## 2022-06-01 MED ORDER — TRAMADOL HCL 50 MG PO TABS: 50.0000 mg | ORAL_TABLET | Freq: Four times a day (QID) | ORAL | 0 refills | Status: DC | PRN

## 2022-06-01 NOTE — Patient Instructions (Addendum)
Preparing for your Surgery  Plan for surgery on June 24, 2022 with Dr. Jeral Pinch at [place] will be scheduled for robotic assisted laparoscopic radical trachelectomy (removal of the cervix through small incisions), excision of pelvic mass, upper vaginectomy, possible sigmoid resection with Dr. Leighton Ruff.   Pre-operative Testing - (on 6/26) You will receive a phone call from presurgical testing at [facility] to arrange for a pre-operative appointment and lab work.  -Bring your insurance card, copy of an advanced directive if applicable, medication list  -At that visit, you will be asked to sign a consent for a possible blood transfusion in case a transfusion becomes necessary during surgery.  The need for a blood transfusion is rare but having consent is a necessary part of your care.     -You should not be taking blood thinners or aspirin at least ten days prior to surgery unless instructed by your surgeon.  -Do not take supplements such as fish oil (omega 3), red yeast rice, turmeric before your surgery. You want to avoid medications with aspirin in them including headache powders such as BC or Goody's), Excedrin migraine.   -YOU WILL NEED TO DECREASE MOBIC USE BEFORE SURGERY WITH THE GOAL FOR NONE AT LEAST 7 DAYS BEFORE. AFTER SURGERY, YOU CAN RESUME THIS.   Day Before Surgery at [facility] OUTLINED BY DR. Marcello Moores. You will be advised you can have clear liquids up until 3 hours before your surgery.    Your role in recovery Your role is to become active as soon as directed by your doctor, while still giving yourself time to heal.  Rest when you feel tired. You will be asked to do the following in order to speed your recovery:  PLAN FOR AN OVERNIGHT STAY AT MINIMUM.  - Cough and breathe deeply. This helps to clear and expand your lungs and can prevent pneumonia after surgery.  - [place]. Do mild physical activity. Walking  or moving your legs help your circulation and body functions return to normal. Do not try to get up or walk alone the first time after surgery.   -If you develop swelling on one leg or the other, pain in the back of your leg, redness/warmth in one of your legs, please call the office or go to the Emergency Room to have a doppler to rule out a blood clot. For shortness of breath, chest pain-seek care in the Emergency Room as soon as possible. - Actively manage your pain. Managing your pain lets you move in comfort. We will ask you to rate your pain on a scale of zero to 10. It is your responsibility to tell your doctor or nurse where and how much you hurt so your pain can be treated.  Special Considerations -If you are diabetic, you may be placed on insulin after surgery to have closer control over your blood sugars to promote healing and recovery.  This does not mean that you will be discharged on insulin.  If applicable, your oral antidiabetics will be resumed when you are tolerating a solid diet.  -Your final pathology results from surgery should be available around one week after surgery and the results will be relayed to you when available.  -Dr. Lahoma Crocker is the surgeon that assists your GYN Oncologist with surgery.  If you end up staying the night, the next day after your surgery you will either see Dr. Berline Lopes or Dr. Lattie Haw  [place].  -FMLA forms can be faxed to [phone] and please allow 5-7 business days for completion.  Pain Management After Surgery -You have been prescribed your pain medication and bowel regimen medications before surgery so that you can have these available when you are discharged from the hospital. The pain medication is for use ONLY AFTER surgery and a new prescription will not be given.   -Make sure that you have Tylenol and Ibuprofen (OR MOBIC, DO NOT TAKE TOGETHER) at home to use on a regular basis after surgery for pain control. We recommend  alternating the medications every hour to six hours since they work differently and are processed in the body differently for pain relief.  -Review the attached handout on narcotic use and their risks and side effects.   Bowel Regimen -You have been prescribed Sennakot-S to take nightly to prevent constipation especially if you are taking the narcotic pain medication intermittently.  It is important to prevent constipation and drink adequate amounts of liquids. You can stop taking this medication when you are not taking pain medication and you are back on your normal bowel routine.  Risks of Surgery Risks of surgery are low but include bleeding, infection, damage to surrounding structures, re-operation, blood clots, and very rarely death.   Blood Transfusion Information (For the consent to be signed before surgery)  We will be checking your blood type before surgery so in case of emergencies, we will know what type of blood you would need.                                            WHAT IS A BLOOD TRANSFUSION?  A transfusion is the replacement of blood or some of its parts. Blood is made up of multiple cells which provide different functions. Red blood cells carry oxygen and are used for blood loss replacement. White blood cells fight against infection. Platelets control bleeding. Plasma helps clot blood. Other blood products are available for specialized needs, such as hemophilia or other clotting disorders. BEFORE THE TRANSFUSION  Who gives blood for transfusions?  You may be able to donate blood to be used at a later date on yourself (autologous donation). Relatives can be asked to donate blood. This is generally not any safer than if you have received blood from a stranger. The same precautions are taken to ensure safety when a relative's blood is donated. Healthy volunteers who are fully evaluated to make sure their blood is safe. This is blood bank blood. Transfusion therapy is the  safest it has ever been in the practice of medicine. Before blood is taken from a donor, a complete history is taken to make sure that person has no history of diseases nor engages in risky social behavior (examples are intravenous drug use or sexual activity with multiple partners). The donor's travel history is screened to minimize risk of transmitting infections, such as malaria. The donated blood is tested for signs of infectious diseases, such as HIV and hepatitis. The blood is then tested to be sure it is compatible with you in order to minimize the chance of a transfusion reaction. If you or a relative donates blood, this is often done in anticipation of surgery and is not appropriate for emergency situations. It takes many days to process the donated blood. RISKS AND COMPLICATIONS Although transfusion therapy is very safe and saves many  lives, the main dangers of transfusion include:  Getting an infectious disease. Developing a transfusion reaction. This is an allergic reaction to something in the blood you were given. Every precaution is taken to prevent this. The decision to have a blood transfusion has been considered carefully by your caregiver before blood is given. Blood is not given unless the benefits outweigh the risks.  AFTER SURGERY INSTRUCTIONS  Return to work: 4-6 weeks if applicable  Activity: 1. Be up and out of the bed during the day.  Take a nap if needed.  You may walk up steps but be careful and use the hand rail.  Stair climbing will tire you more than you think, you may need to stop part way and rest.   2. No lifting or straining for 6 weeks over 10 pounds. No pushing, pulling, straining for 6 weeks.  3. No driving for around 1 week(s).  Do not drive if you are taking narcotic pain medicine and make sure that your reaction time has returned.   4. You can shower as soon as the next day after surgery. Shower daily.  Use your regular soap and water (not directly on the  incision) and pat your incision(s) dry afterwards; don't rub.  No tub baths or submerging your body in water until cleared by your surgeon. If you have the soap that was given to you by pre-surgical testing that was used before surgery, you do not need to use it afterwards because this can irritate your incisions.   5. No sexual activity and nothing in the vagina for 8 weeks.  6. You may experience a small amount of clear drainage from your incisions, which is normal.  If the drainage persists, increases, or changes color please call the office.  7. Do not use creams, lotions, or ointments such as neosporin on your incisions after surgery until advised by your surgeon because they can cause removal of the dermabond glue on your incisions.    8. You may experience vaginal spotting after surgery or around the 6-8 week mark from surgery when the stitches at the top of the vagina begin to dissolve.  The spotting is normal but if you experience heavy bleeding, call our office.  9. Take Tylenol or ibuprofen (OR MOBIC) first for pain if you are able to take these medications and only use narcotic pain medication for severe pain not relieved by the Tylenol or Ibuprofen.  Monitor your Tylenol intake to a max of 4,000 mg in a 24 hour period. You can alternate these medications after surgery.  Diet: 1. Low sodium Heart Healthy Diet is recommended but you are cleared to resume your normal (before surgery) diet after your procedure.  2. It is safe to use a laxative, such as Miralax or Colace, if you have difficulty moving your bowels. You have been prescribed Sennakot-S to take at bedtime every evening after surgery to keep bowel movements regular and to prevent constipation. IF YOU HAVE A BOWEL RESECTION, TAKE MEDICATIONS PER DR. THOMAS RECOMMENDATIONS.   Wound Care: 1. Keep clean and dry.  Shower daily.  Reasons to call the Doctor: Fever - Oral temperature greater than 100.4 degrees  Fahrenheit Foul-smelling vaginal discharge Difficulty urinating Nausea and vomiting Increased pain at the site of the incision that is unrelieved with pain medicine. Difficulty breathing with or without chest pain New calf pain especially if only on one side Sudden, continuing increased vaginal bleeding with or without clots.   Contacts: For questions  or concerns you should contact:  Dr. Jeral Pinch at [phone]  Joylene John, NP at [phone]  After Hours: call [phone] and have the GYN Oncologist paged/contacted (after 5 pm or on the weekends).  Messages sent via mychart are for non-urgent matters and are not responded to after hours so for urgent needs, please call the after hours number.

## 2022-06-01 NOTE — Telephone Encounter (Signed)
Spoke with pt to inform her that her alkaline phosphatase is elevated at 185. Per Elizabeth John, Np pt needs to limit her Tylenol use. She stated she only takes it PRN. She verbalized understanding.

## 2022-06-01 NOTE — Addendum Note (Signed)
Addended byVenora Maples on: 06/01/2022 02:56 PM   Modules accepted: Orders

## 2022-06-01 NOTE — Progress Notes (Signed)
Patient here with her husband for a pre-operative appointment prior to her scheduled surgery on June 24, 2022. She is scheduled for robotic assisted laparoscopic radical trachelectomy, excision of pelvic mass, upper vaginectomy, possible sigmoid resection with Dr. Leighton Ruff. The surgery was discussed in detail.  See after visit summary for additional details. Visual aids used to discuss items related to surgery including the incentive spirometer, sequential compression stockings, foley catheter, IV pump, multi-modal pain regimen including tylenol, photo of the surgical robot, female reproductive system to discuss surgery in detail.      Discussed post-op pain management in detail including the aspects of the enhanced recovery pathway.  Advised her that a new prescription would be sent in for tramadol and it is only to be used for after her upcoming surgery.  We discussed the use of tylenol post-op and to monitor for a maximum of 4,000 mg in a 24 hour period.  Also prescribed sennakot to be used after surgery and to hold if having loose stools.  Discussed bowel regimen in detail.     Discussed the use of heparin pre-op, SCDs, and measures to take at home to prevent DVT including frequent mobility.  Reportable signs and symptoms of DVT discussed. Post-operative instructions discussed and expectations for after surgery. Incisional care discussed as well including reportable signs and symptoms including erythema, drainage, wound separation.     5 minutes spent with the patient.  Verbalizing understanding of material discussed. No needs or concerns voiced at the end of the visit.   Advised patient and family to call for any needs.  Advised that her post-operative medications had been prescribed and could be picked up at any time.    This appointment is included in the global surgical bundle as pre-operative teaching and has no charge.

## 2022-06-04 ENCOUNTER — Telehealth: Payer: Self-pay

## 2022-06-04 NOTE — Telephone Encounter (Signed)
Spoke with patient and reviewed recent CT results. Per Joylene John, NP the cervical mass remains, but there is no evidence of metastatic disease in abdomen and pelvis. We will planned to proceed with surgery as scheduled on 06/24/22. Scan does show some plaque in arteries. Patient verbalizes understanding of above information and did not voice any questions. Instructed to call with any needs.

## 2022-06-09 ENCOUNTER — Ambulatory Visit: Payer: Self-pay | Admitting: General Surgery

## 2022-06-09 MED ORDER — DEXTROSE 5 % IV SOLN: 1.0000 g | INTRAVENOUS | Status: AC

## 2022-06-09 NOTE — H&P (Signed)
REFERRING PHYSICIAN:  Loni Dolly*   PROVIDER:  Monico Blitz, MD   MRN: [id number] DOB: Apr 21, 1961 DATE OF ENCOUNTER: 06/09/2022   Subjective    Chief Complaint: New Consultation       History of Present Illness: Elizabeth Bartlett is a 61 y.o. female who is seen today as an office consultation at the request of Dr. Berline Lopes for evaluation of New Consultation .  Patient presented with complaints of intermittent post menopausal bleeding.  She is status post supracervical hysterectomy in 2014.  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  CT scan showed a 4 x 3 cm mass located just posterior and inferior to the surgical clips in her pelvis.  Patient was referred to Dr. Berline Lopes in March for further management.  PET scan showed no signs of metastatic disease.  She completed external beam radiation therapy and subsequent brachiotherapy.  Repeat MRI performed after this treatment showed minimal improvement.  Surgical resection was offered by Dr. Berline Lopes.  I was asked to evaluate due to possible sigmoid involvement. Review of Systems: A complete review of systems was obtained from the patient.  I have reviewed this information and discussed as appropriate with the patient.  See HPI as well for other ROS.     Medical History: Past Medical History      Past Medical History:  Diagnosis Date   History of cancer     Hyperlipidemia     Thyroid disease          There is no problem list on file for this patient.     Past Surgical History       Past Surgical History:  Procedure Laterality Date   breast surgery       CESAREAN SECTION       CHOLECYSTECTOMY       HYSTERECTOMY            Allergies  No Known Allergies           Current Outpatient Medications on File Prior to Visit  Medication Sig Dispense Refill   buPROPion (WELLBUTRIN SR) 150 MG SR tablet Take 150 mg by mouth 2 (two) times daily       FLUoxetine (PROZAC) 40 MG capsule Take 40 mg by mouth once  daily       fluticasone propionate (FLONASE) 50 mcg/actuation nasal spray Place into one nostril       levothyroxine (SYNTHROID) 25 MCG tablet Take by mouth        No current facility-administered medications on file prior to visit.      Family History       Family History  Problem Relation Age of Onset   Colon cancer Father          Social History       Tobacco Use  Smoking Status Never  Smokeless Tobacco Never      Social History  Social History        Socioeconomic History   Marital status: Married  Tobacco Use   Smoking status: Never   Smokeless tobacco: Never  Vaping Use   Vaping Use: Never used  Substance and Sexual Activity   Alcohol use: Never   Drug use: Never        Objective:         Vitals:    06/09/22 0944  BP: 128/86  Pulse: 101  Temp: 36.5 C (97.7 F)  SpO2: 96%  Weight: 75 kg (165 lb 6.4 oz)  Height: 152.4  cm (5')      Exam Gen: NAD CV: RRR Lungs: CTA Abd: soft     Labs, Imaging and Diagnostic Testing: CT and MRI images reviewed.  Patient appears to have adherent to the mid sigmoid colon to the site of the tumor bed.  There does not appear to be any rectal involvement.   Assessment and Plan:  Diagnoses and all orders for this visit:   Endometrial cancer (CMS-HCC) -     polyethylene glycol (MIRALAX) powder; Take 233.75 g by mouth once for 1 dose Take according to your procedure prep instructions. -     bisacodyL (DULCOLAX) 5 mg EC tablet; Take 4 tablets (20 mg total) by mouth once for 1 dose -     metroNIDAZOLE (FLAGYL) 500 MG tablet; Take 2 tablets (1,000 mg total) by mouth 3 (three) times daily for 6 doses Take according to your procedure colon prep instructions     61 year old female with endometrial cancer that does not appear to be responsive to radiation.  A trachelectomy is being planned by Dr. Berline Lopes.  The sigmoid colon appears to draped over this area.  We discussed today the possibility of a sigmoid resection if  needed.  We discussed that she is at a slightly higher risk for anastomotic dehiscence due to recent history of radiation and we would most likely take wide margins of sigmoid colon to allow for healthy perfused tissue for anastomosis.  We also discussed the risk of damage to adjacent structures.  We will try to avoid this is much as possible.  We discussed a small chance of needing a temporary diverting ileostomy either at the time of surgery or afterwards if she developed a postoperative leak.  We also discussed the risk of bleeding and changes in bowel habits after surgery.  All questions were answered.  Bowel prep given to patient and verbal and written formats.  Preoperative antibiotics sent to pharmacy.   Rosario Adie, MD Colon and Rectal Surgery [facility]

## 2022-06-09 NOTE — H&P (View-Only) (Signed)
REFERRING PHYSICIAN:  Loni Dolly*   PROVIDER:  Monico Blitz, MD   MRN: [id number] DOB: [birth year] DATE OF ENCOUNTER: 06/09/2022   Subjective    Chief Complaint: New Consultation       History of Present Illness: Elizabeth Bartlett is a 61 y.o. female who is seen today as an office consultation at the request of Dr. Berline Lopes for evaluation of New Consultation .  Patient presented with complaints of intermittent post menopausal bleeding.  She is status post supracervical hysterectomy in 2014.  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  CT scan showed a 4 x 3 cm mass located just posterior and inferior to the surgical clips in her pelvis.  Patient was referred to Dr. Berline Lopes in March for further management.  PET scan showed no signs of metastatic disease.  She completed external beam radiation therapy and subsequent brachiotherapy.  Repeat MRI performed after this treatment showed minimal improvement.  Surgical resection was offered by Dr. Berline Lopes.  I was asked to evaluate due to possible sigmoid involvement. Review of Systems: A complete review of systems was obtained from the patient.  I have reviewed this information and discussed as appropriate with the patient.  See HPI as well for other ROS.     Medical History: Past Medical History      Past Medical History:  Diagnosis Date   History of cancer     Hyperlipidemia     Thyroid disease          There is no problem list on file for this patient.     Past Surgical History       Past Surgical History:  Procedure Laterality Date   breast surgery       CESAREAN SECTION       CHOLECYSTECTOMY       HYSTERECTOMY            Allergies  No Known Allergies           Current Outpatient Medications on File Prior to Visit  Medication Sig Dispense Refill   buPROPion (WELLBUTRIN SR) 150 MG SR tablet Take 150 mg by mouth 2 (two) times daily       FLUoxetine (PROZAC) 40 MG capsule Take 40 mg by mouth once  daily       fluticasone propionate (FLONASE) 50 mcg/actuation nasal spray Place into one nostril       levothyroxine (SYNTHROID) 25 MCG tablet Take by mouth        No current facility-administered medications on file prior to visit.      Family History       Family History  Problem Relation Age of Onset   Colon cancer Father          Social History       Tobacco Use  Smoking Status Never  Smokeless Tobacco Never      Social History  Social History        Socioeconomic History   Marital status: Married  Tobacco Use   Smoking status: Never   Smokeless tobacco: Never  Vaping Use   Vaping Use: Never used  Substance and Sexual Activity   Alcohol use: Never   Drug use: Never        Objective:         Vitals:    06/09/22 0944  BP: 128/86  Pulse: 101  Temp: 36.5 C (97.7 F)  SpO2: 96%  Weight: 75 kg (165 lb 6.4 oz)  Height: 152.4  cm (5')      Exam Gen: NAD CV: RRR Lungs: CTA Abd: soft     Labs, Imaging and Diagnostic Testing: CT and MRI images reviewed.  Patient appears to have adherent to the mid sigmoid colon to the site of the tumor bed.  There does not appear to be any rectal involvement.   Assessment and Plan:  Diagnoses and all orders for this visit:   Endometrial cancer (CMS-HCC) -     polyethylene glycol (MIRALAX) powder; Take 233.75 g by mouth once for 1 dose Take according to your procedure prep instructions. -     bisacodyL (DULCOLAX) 5 mg EC tablet; Take 4 tablets (20 mg total) by mouth once for 1 dose -     metroNIDAZOLE (FLAGYL) 500 MG tablet; Take 2 tablets (1,000 mg total) by mouth 3 (three) times daily for 6 doses Take according to your procedure colon prep instructions     62 year old female with endometrial cancer that does not appear to be responsive to radiation.  A trachelectomy is being planned by Dr. Berline Lopes.  The sigmoid colon appears to draped over this area.  We discussed today the possibility of a sigmoid resection if  needed.  We discussed that she is at a slightly higher risk for anastomotic dehiscence due to recent history of radiation and we would most likely take wide margins of sigmoid colon to allow for healthy perfused tissue for anastomosis.  We also discussed the risk of damage to adjacent structures.  We will try to avoid this is much as possible.  We discussed a small chance of needing a temporary diverting ileostomy either at the time of surgery or afterwards if she developed a postoperative leak.  We also discussed the risk of bleeding and changes in bowel habits after surgery.  All questions were answered.  Bowel prep given to patient and verbal and written formats.  Preoperative antibiotics sent to pharmacy.   Rosario Adie, MD Colon and Rectal Surgery [facility]

## 2022-06-10 ENCOUNTER — Encounter: Payer: Self-pay | Admitting: Radiation Oncology

## 2022-06-10 NOTE — Progress Notes (Incomplete)
  Radiation Oncology         [phone] ________________________________  Patient Name: Elizabeth Bartlett MRN: [id number] DOB: 1961/03/27 Referring Physician: Jeral Pinch Date of Service: 05/12/2022 [facility], [place]                                                        End Of Treatment Note  Diagnoses: C54.1-Malignant neoplasm of endometrium  Cancer Staging: The primary encounter diagnosis was Malignant neoplasm of uterus, unspecified site [facility]). A diagnosis of Gynecologic malignancy [facility]) was also pertinent to this visit.   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  (Likely originating in the residual lower uterine segment, after supracervical hysterectomy, with infiltration of the cervical region)  Intent: Curative  Radiation Treatment Dates: 04/06/2022 through 05/12/2022 Site Technique Total Dose (Gy) Dose per Fx (Gy) Completed Fx Beam Energies  Pelvis: Pelvis IMRT 45/45 1.8 25/25 6X  Cervix: Cervix_Bst HDR-brachy 5.5/5.5 5.5 1/1 Ir-192   Narrative: The patient tolerated radiation therapy relatively well. During her final weekly treatment check for IMRT on 05/05/22, the patient reported mild pelvic pain, fatigue, feeling queasy at times, ongoing diarrhea, and ongoing dysuria (encouraged patient to purchase AZO). She also reported no further vaginal bleeding since an episode which occurred several days prior to her final weekly treatment check. She otherwise tolerated cervical brachytherapy well.   Plan: The patient will follow-up with radiation oncology in one month .  ________________________________________________ -----------------------------------  Blair Promise, PhD, MD  This document serves as a record of services personally performed by Gery Pray, MD. It was created on his behalf by Roney Mans, a trained medical scribe. The creation of this record is based on the scribe's personal observations and the provider's  statements to them. This document has been checked and approved by the attending provider.

## 2022-06-15 ENCOUNTER — Encounter: Payer: Self-pay | Admitting: Radiation Oncology

## 2022-06-15 ENCOUNTER — Encounter (HOSPITAL_COMMUNITY): Payer: Self-pay

## 2022-06-15 ENCOUNTER — Encounter (HOSPITAL_COMMUNITY)
Admission: RE | Admit: 2022-06-15 | Discharge: 2022-06-15 | Disposition: A | Discharge status: Home / Self Care | Payer: BC Managed Care – PPO | Source: Ambulatory Visit | Attending: Gynecologic Oncology | Admitting: Gynecologic Oncology

## 2022-06-15 ENCOUNTER — Ambulatory Visit
Admission: RE | Admit: 2022-06-15 | Discharge: 2022-06-15 | Disposition: A | Discharge status: Home / Self Care | Payer: BC Managed Care – PPO | Source: Ambulatory Visit | Attending: Radiation Oncology | Admitting: Radiation Oncology

## 2022-06-15 ENCOUNTER — Other Ambulatory Visit: Payer: Self-pay

## 2022-06-15 DIAGNOSIS — C579 Malignant neoplasm of female genital organ, unspecified: Secondary | ICD-10-CM | POA: Insufficient documentation

## 2022-06-15 DIAGNOSIS — Z79899 Other long term (current) drug therapy: Secondary | ICD-10-CM | POA: Insufficient documentation

## 2022-06-15 DIAGNOSIS — M5136 Other intervertebral disc degeneration, lumbar region: Secondary | ICD-10-CM | POA: Diagnosis not present

## 2022-06-15 DIAGNOSIS — Z923 Personal history of irradiation: Secondary | ICD-10-CM | POA: Insufficient documentation

## 2022-06-15 DIAGNOSIS — C539 Malignant neoplasm of cervix uteri, unspecified: Secondary | ICD-10-CM

## 2022-06-15 DIAGNOSIS — Z79890 Hormone replacement therapy (postmenopausal): Secondary | ICD-10-CM | POA: Diagnosis not present

## 2022-06-15 DIAGNOSIS — Z01812 Encounter for preprocedural laboratory examination: Secondary | ICD-10-CM | POA: Diagnosis present

## 2022-06-15 DIAGNOSIS — R1900 Intra-abdominal and pelvic swelling, mass and lump, unspecified site: Secondary | ICD-10-CM | POA: Diagnosis not present

## 2022-06-15 DIAGNOSIS — C541 Malignant neoplasm of endometrium: Secondary | ICD-10-CM | POA: Insufficient documentation

## 2022-06-15 HISTORY — DX: Personal history of irradiation: Z92.3

## 2022-06-15 LAB — COMPREHENSIVE METABOLIC PANEL
ALT: 32 U/L (ref 0–44)
AST: 28 U/L (ref 15–41)
Albumin: 3.9 g/dL (ref 3.5–5.0)
Alkaline Phosphatase: 153 U/L — ABNORMAL HIGH (ref 38–126)
Anion gap: 8 (ref 5–15)
BUN: 14 mg/dL (ref 8–23)
CO2: 24 mmol/L (ref 22–32)
Calcium: 9 mg/dL (ref 8.9–10.3)
Chloride: 107 mmol/L (ref 98–111)
Creatinine, Ser: 0.8 mg/dL (ref 0.44–1.00)
GFR, Estimated: >60 mL/min (ref >60)
Glucose, Bld: 93 mg/dL (ref 70–99)
Potassium: 4 mmol/L (ref 3.5–5.1)
Sodium: 139 mmol/L (ref 135–145)
Total Bilirubin: 0.7 mg/dL (ref 0.3–1.2)
Total Protein: 6.8 g/dL (ref 6.5–8.1)

## 2022-06-15 LAB — CBC
HCT: 38.2 % (ref 36.0–46.0)
Hemoglobin: 12.4 g/dL (ref 12.0–15.0)
MCH: 31 pg (ref 26.0–34.0)
MCHC: 32.5 g/dL (ref 30.0–36.0)
MCV: 95.5 fL (ref 80.0–100.0)
Platelets: 320 10*3/uL (ref 150–400)
RBC: 4 MIL/uL (ref 3.87–5.11)
RDW: 15.4 % (ref 11.5–15.5)
WBC: 6.2 10*3/uL (ref 4.0–10.5)
nRBC: 0 % (ref 0.0–0.2)

## 2022-06-22 ENCOUNTER — Telehealth: Payer: Self-pay

## 2022-06-22 ENCOUNTER — Other Ambulatory Visit: Payer: Self-pay | Admitting: Urology

## 2022-06-22 NOTE — Anesthesia Preprocedure Evaluation (Signed)
Anesthesia Evaluation  Patient identified by MRN, date of birth, ID band Patient awake    Reviewed: Allergy & Precautions, NPO status , Patient's Chart, lab work & pertinent test results  History of Anesthesia Complications Negative for: history of anesthetic complications  Airway Mallampati: II  TM Distance: >3 FB Neck ROM: Full    Dental no notable dental hx.    Pulmonary sleep apnea ,    Pulmonary exam normal        Cardiovascular negative cardio ROS Normal cardiovascular exam     Neuro/Psych  Headaches, Anxiety Depression    GI/Hepatic Neg liver ROS, GERD  Medicated and Controlled,  Endo/Other  Hypothyroidism   Renal/GU negative Renal ROS  negative genitourinary   Musculoskeletal negative musculoskeletal ROS (+)   Abdominal   Peds  Hematology negative hematology ROS (+)   Anesthesia Other Findings Day of surgery medications reviewed with patient.  Reproductive/Obstetrics negative OB ROS                            Anesthesia Physical Anesthesia Plan  ASA: 3  Anesthesia Plan: General   Post-op Pain Management: Ketamine IV*, Gabapentin PO (pre-op)*, Celebrex PO (pre-op)* and Tylenol PO (pre-op)*   Induction: Intravenous  PONV Risk Score and Plan: 4 or greater and Midazolam, Treatment may vary due to age or medical condition, Scopolamine patch - Pre-op, Dexamethasone and Ondansetron  Airway Management Planned: Oral ETT  Additional Equipment: ClearSight  Intra-op Plan:   Post-operative Plan: Extubation in OR  Informed Consent: I have reviewed the patients History and Physical, chart, labs and discussed the procedure including the risks, benefits and alternatives for the proposed anesthesia with the patient or authorized representative who has indicated his/her understanding and acceptance.     Dental advisory given  Plan Discussed with: CRNA  Anesthesia Plan Comments:         Anesthesia Quick Evaluation

## 2022-06-22 NOTE — Telephone Encounter (Signed)
Telephone call to check on pre-operative status.  Patient compliant with pre-operative instructions.  Patient has diet instructions from Dr. Marcello Moores and has picked up the prep for it. Patient to arrive at 0630 am on Wednesday.  No questions or concerns voiced.  Instructed to call for any needs.

## 2022-06-24 ENCOUNTER — Encounter (HOSPITAL_COMMUNITY)
Admission: RE | Disposition: A | Discharge status: Home / Self Care | Payer: Self-pay | Source: Ambulatory Visit | Attending: Gynecologic Oncology

## 2022-06-24 ENCOUNTER — Other Ambulatory Visit: Payer: Self-pay

## 2022-06-24 ENCOUNTER — Ambulatory Visit (HOSPITAL_COMMUNITY): Payer: BC Managed Care – PPO | Admitting: Anesthesiology

## 2022-06-24 ENCOUNTER — Encounter (HOSPITAL_COMMUNITY): Payer: Self-pay | Admitting: Gynecologic Oncology

## 2022-06-24 ENCOUNTER — Ambulatory Visit (HOSPITAL_COMMUNITY): Payer: BC Managed Care – PPO | Admitting: Physician Assistant

## 2022-06-24 ENCOUNTER — Ambulatory Visit (HOSPITAL_COMMUNITY)
Admission: RE | Admit: 2022-06-24 | Discharge: 2022-06-25 | Disposition: A | Discharge status: Home / Self Care | Payer: BC Managed Care – PPO | Source: Ambulatory Visit | Attending: Gynecologic Oncology | Admitting: Gynecologic Oncology

## 2022-06-24 DIAGNOSIS — R87612 Low grade squamous intraepithelial lesion on cytologic smear of cervix (LGSIL): Secondary | ICD-10-CM | POA: Insufficient documentation

## 2022-06-24 DIAGNOSIS — C579 Malignant neoplasm of female genital organ, unspecified: Secondary | ICD-10-CM | POA: Diagnosis present

## 2022-06-24 DIAGNOSIS — C538 Malignant neoplasm of overlapping sites of cervix uteri: Secondary | ICD-10-CM | POA: Diagnosis not present

## 2022-06-24 DIAGNOSIS — C541 Malignant neoplasm of endometrium: Secondary | ICD-10-CM | POA: Diagnosis not present

## 2022-06-24 DIAGNOSIS — N893 Dysplasia of vagina, unspecified: Secondary | ICD-10-CM

## 2022-06-24 DIAGNOSIS — C539 Malignant neoplasm of cervix uteri, unspecified: Secondary | ICD-10-CM | POA: Insufficient documentation

## 2022-06-24 DIAGNOSIS — R1900 Intra-abdominal and pelvic swelling, mass and lump, unspecified site: Secondary | ICD-10-CM

## 2022-06-24 HISTORY — PX: VAGINECTOMY, PARTIAL: SHX6846

## 2022-06-24 HISTORY — PX: ROBOTIC ASSISTED TOTAL HYSTERECTOMY: SHX6085

## 2022-06-24 HISTORY — PX: CYSTOSCOPY WITH INJECTION: SHX1424

## 2022-06-24 LAB — TYPE AND SCREEN
ABO/RH(D): A POS
Antibody Screen: NEGATIVE

## 2022-06-24 SURGERY — HYSTERECTOMY, TOTAL, ROBOT-ASSISTED
Anesthesia: General

## 2022-06-24 MED ORDER — EPHEDRINE SULFATE (PRESSORS) 50 MG/ML IJ SOLN
INTRAMUSCULAR | Status: DC | PRN
  Administered 2022-06-24: 5 mg via INTRAVENOUS
  Administered 2022-06-24: 10 mg via INTRAVENOUS

## 2022-06-24 MED ORDER — ROCURONIUM BROMIDE 100 MG/10ML IV SOLN
INTRAVENOUS | Status: DC | PRN
  Administered 2022-06-24 (×3): 20 mg via INTRAVENOUS
  Administered 2022-06-24: 60 mg via INTRAVENOUS
  Administered 2022-06-24: 20 mg via INTRAVENOUS

## 2022-06-24 MED ORDER — ROCURONIUM BROMIDE 10 MG/ML (PF) SYRINGE
PREFILLED_SYRINGE | INTRAVENOUS | Status: AC
  Filled 2022-06-24: qty 10

## 2022-06-24 MED ORDER — LEVOTHYROXINE SODIUM 25 MCG PO TABS
25.0000 ug | ORAL_TABLET | Freq: Every day | ORAL | Status: DC
  Administered 2022-06-25: 25 ug via ORAL
  Filled 2022-06-24: qty 1

## 2022-06-24 MED ORDER — LACTATED RINGERS IV SOLN: INTRAVENOUS | Status: DC

## 2022-06-24 MED ORDER — STERILE WATER FOR IRRIGATION IR SOLN
Status: DC | PRN
  Administered 2022-06-24: 2000 mL

## 2022-06-24 MED ORDER — SODIUM CHLORIDE 0.9 % IR SOLN
Status: DC | PRN
  Administered 2022-06-24: 3000 mL via INTRAVESICAL

## 2022-06-24 MED ORDER — STERILE WATER FOR INJECTION IJ SOLN
INTRAMUSCULAR | Status: DC | PRN
  Administered 2022-06-24: 10 mL via INTRAVESICAL

## 2022-06-24 MED ORDER — BUPIVACAINE-EPINEPHRINE 0.25% -1:200000 IJ SOLN
INTRAMUSCULAR | Status: DC | PRN
  Administered 2022-06-24: 43 mL

## 2022-06-24 MED ORDER — ENOXAPARIN SODIUM 40 MG/0.4ML IJ SOSY
40.0000 mg | PREFILLED_SYRINGE | INTRAMUSCULAR | Status: DC
  Administered 2022-06-25: 40 mg via SUBCUTANEOUS
  Filled 2022-06-24: qty 0.4

## 2022-06-24 MED ORDER — FENTANYL CITRATE (PF) 100 MCG/2ML IJ SOLN
INTRAMUSCULAR | Status: AC
  Filled 2022-06-24: qty 2

## 2022-06-24 MED ORDER — MIDAZOLAM HCL 5 MG/5ML IJ SOLN
INTRAMUSCULAR | Status: DC | PRN
  Administered 2022-06-24 (×2): 1 mg via INTRAVENOUS

## 2022-06-24 MED ORDER — LIDOCAINE HCL (PF) 2 % IJ SOLN
INTRAMUSCULAR | Status: AC
  Filled 2022-06-24: qty 10

## 2022-06-24 MED ORDER — SENNOSIDES-DOCUSATE SODIUM 8.6-50 MG PO TABS
2.0000 | ORAL_TABLET | Freq: Every day | ORAL | Status: DC
  Administered 2022-06-24: 2 via ORAL
  Filled 2022-06-24: qty 2

## 2022-06-24 MED ORDER — HYDROMORPHONE HCL 1 MG/ML IJ SOLN: 0.2500 mg | INTRAMUSCULAR | Status: DC | PRN

## 2022-06-24 MED ORDER — EPHEDRINE 5 MG/ML INJ
INTRAVENOUS | Status: AC
  Filled 2022-06-24: qty 5

## 2022-06-24 MED ORDER — DROPERIDOL 2.5 MG/ML IJ SOLN: 0.6250 mg | Freq: Once | INTRAMUSCULAR | Status: DC | PRN

## 2022-06-24 MED ORDER — ONDANSETRON HCL 4 MG/2ML IJ SOLN
INTRAMUSCULAR | Status: AC
  Filled 2022-06-24: qty 2

## 2022-06-24 MED ORDER — ALVIMOPAN 12 MG PO CAPS
12.0000 mg | ORAL_CAPSULE | ORAL | Status: AC
  Administered 2022-06-24: 12 mg via ORAL
  Filled 2022-06-24: qty 1

## 2022-06-24 MED ORDER — DEXAMETHASONE SODIUM PHOSPHATE 4 MG/ML IJ SOLN
4.0000 mg | INTRAMUSCULAR | Status: AC
  Administered 2022-06-24: 4 mg via INTRAVENOUS

## 2022-06-24 MED ORDER — ONDANSETRON HCL 4 MG/2ML IJ SOLN: 4.0000 mg | Freq: Four times a day (QID) | INTRAMUSCULAR | Status: DC | PRN

## 2022-06-24 MED ORDER — SODIUM CHLORIDE 0.9 % IV SOLN
2.0000 g | INTRAVENOUS | Status: AC
  Administered 2022-06-24: 2 g via INTRAVENOUS
  Filled 2022-06-24 (×2): qty 2

## 2022-06-24 MED ORDER — LIDOCAINE HCL (PF) 2 % IJ SOLN
INTRAMUSCULAR | Status: AC
  Filled 2022-06-24: qty 5

## 2022-06-24 MED ORDER — KETAMINE HCL 10 MG/ML IJ SOLN
INTRAMUSCULAR | Status: DC | PRN
  Administered 2022-06-24 (×2): 10 mg via INTRAVENOUS
  Administered 2022-06-24: 30 mg via INTRAVENOUS
  Administered 2022-06-24: 10 mg via INTRAVENOUS

## 2022-06-24 MED ORDER — DICLOFENAC SODIUM 50 MG PO TBEC
50.0000 mg | DELAYED_RELEASE_TABLET | Freq: Three times a day (TID) | ORAL | Status: DC
  Administered 2022-06-24 – 2022-06-25 (×2): 50 mg via ORAL
  Filled 2022-06-24 (×3): qty 1

## 2022-06-24 MED ORDER — KCL IN DEXTROSE-NACL 20-5-0.45 MEQ/L-%-% IV SOLN
INTRAVENOUS | Status: DC
  Filled 2022-06-24 (×2): qty 1000

## 2022-06-24 MED ORDER — ONDANSETRON HCL 4 MG/2ML IJ SOLN
INTRAMUSCULAR | Status: DC | PRN
  Administered 2022-06-24: 4 mg via INTRAVENOUS

## 2022-06-24 MED ORDER — FENTANYL CITRATE (PF) 100 MCG/2ML IJ SOLN
INTRAMUSCULAR | Status: DC | PRN
  Administered 2022-06-24 (×3): 25 ug via INTRAVENOUS
  Administered 2022-06-24: 50 ug via INTRAVENOUS
  Administered 2022-06-24: 25 ug via INTRAVENOUS
  Administered 2022-06-24: 50 ug via INTRAVENOUS
  Administered 2022-06-24: 25 ug via INTRAVENOUS
  Administered 2022-06-24: 50 ug via INTRAVENOUS

## 2022-06-24 MED ORDER — FAMOTIDINE 20 MG PO TABS
20.0000 mg | ORAL_TABLET | Freq: Two times a day (BID) | ORAL | Status: DC
  Administered 2022-06-24 – 2022-06-25 (×2): 20 mg via ORAL
  Filled 2022-06-24 (×2): qty 1

## 2022-06-24 MED ORDER — PROPOFOL 10 MG/ML IV BOLUS
INTRAVENOUS | Status: AC
  Filled 2022-06-24: qty 20

## 2022-06-24 MED ORDER — ALBUTEROL SULFATE HFA 108 (90 BASE) MCG/ACT IN AERS
INHALATION_SPRAY | RESPIRATORY_TRACT | Status: AC
  Filled 2022-06-24: qty 6.7

## 2022-06-24 MED ORDER — SCOPOLAMINE 1 MG/3DAYS TD PT72
1.0000 | MEDICATED_PATCH | Freq: Once | TRANSDERMAL | Status: DC
  Administered 2022-06-24: 1.5 mg via TRANSDERMAL
  Filled 2022-06-24: qty 1

## 2022-06-24 MED ORDER — LIDOCAINE HCL (PF) 2 % IJ SOLN
INTRAMUSCULAR | Status: DC | PRN
  Administered 2022-06-24: 1.5 mg/kg/h via INTRADERMAL

## 2022-06-24 MED ORDER — BUPIVACAINE HCL 0.25 % IJ SOLN
INTRAMUSCULAR | Status: AC
Start: 2022-06-24 — End: ?
  Filled 2022-06-24: qty 1

## 2022-06-24 MED ORDER — DEXAMETHASONE SODIUM PHOSPHATE 10 MG/ML IJ SOLN
INTRAMUSCULAR | Status: AC
  Filled 2022-06-24: qty 1

## 2022-06-24 MED ORDER — MIDAZOLAM HCL 2 MG/2ML IJ SOLN
INTRAMUSCULAR | Status: AC
  Filled 2022-06-24: qty 2

## 2022-06-24 MED ORDER — LACTATED RINGERS IV SOLN: INTRAVENOUS | Status: DC | PRN

## 2022-06-24 MED ORDER — ACETAMINOPHEN 500 MG PO TABS
1000.0000 mg | ORAL_TABLET | ORAL | Status: AC
  Administered 2022-06-24: 1000 mg via ORAL
  Filled 2022-06-24: qty 2

## 2022-06-24 MED ORDER — LIDOCAINE HCL (CARDIAC) PF 100 MG/5ML IV SOSY
PREFILLED_SYRINGE | INTRAVENOUS | Status: DC | PRN
  Administered 2022-06-24: 60 mg via INTRAVENOUS

## 2022-06-24 MED ORDER — HYDROMORPHONE HCL 1 MG/ML IJ SOLN: 0.5000 mg | INTRAMUSCULAR | Status: DC | PRN

## 2022-06-24 MED ORDER — SUGAMMADEX SODIUM 200 MG/2ML IV SOLN
INTRAVENOUS | Status: DC | PRN
  Administered 2022-06-24: 50 mg via INTRAVENOUS
  Administered 2022-06-24: 200 mg via INTRAVENOUS

## 2022-06-24 MED ORDER — ACETAMINOPHEN 500 MG PO TABS
1000.0000 mg | ORAL_TABLET | Freq: Four times a day (QID) | ORAL | Status: DC
  Administered 2022-06-24 – 2022-06-25 (×3): 1000 mg via ORAL
  Filled 2022-06-24 (×3): qty 2

## 2022-06-24 MED ORDER — KETAMINE HCL 10 MG/ML IJ SOLN
INTRAMUSCULAR | Status: AC
  Filled 2022-06-24: qty 1

## 2022-06-24 MED ORDER — FENTANYL CITRATE (PF) 250 MCG/5ML IJ SOLN
INTRAMUSCULAR | Status: AC
  Filled 2022-06-24: qty 5

## 2022-06-24 MED ORDER — OXYCODONE HCL 5 MG PO TABS: 5.0000 mg | ORAL_TABLET | ORAL | Status: DC | PRN

## 2022-06-24 MED ORDER — ONDANSETRON HCL 4 MG PO TABS: 4.0000 mg | ORAL_TABLET | Freq: Four times a day (QID) | ORAL | Status: DC | PRN

## 2022-06-24 MED ORDER — LACTATED RINGERS IR SOLN
Status: DC | PRN
  Administered 2022-06-24: 1000 mL

## 2022-06-24 MED ORDER — ORAL CARE MOUTH RINSE: 15.0000 mL | Freq: Once | OROMUCOSAL | Status: AC

## 2022-06-24 MED ORDER — GABAPENTIN 300 MG PO CAPS
300.0000 mg | ORAL_CAPSULE | ORAL | Status: AC
  Administered 2022-06-24: 300 mg via ORAL
  Filled 2022-06-24: qty 1

## 2022-06-24 MED ORDER — CELECOXIB 200 MG PO CAPS
200.0000 mg | ORAL_CAPSULE | ORAL | Status: AC
  Administered 2022-06-24: 200 mg via ORAL
  Filled 2022-06-24: qty 1

## 2022-06-24 MED ORDER — BUPROPION HCL ER (SR) 150 MG PO TB12
150.0000 mg | ORAL_TABLET | Freq: Two times a day (BID) | ORAL | Status: DC
  Administered 2022-06-24 – 2022-06-25 (×2): 150 mg via ORAL
  Filled 2022-06-24 (×2): qty 1

## 2022-06-24 MED ORDER — ACETAMINOPHEN 500 MG PO TABS: 1000.0000 mg | ORAL_TABLET | Freq: Once | ORAL | Status: DC

## 2022-06-24 MED ORDER — HEPARIN SODIUM (PORCINE) 5000 UNIT/ML IJ SOLN
5000.0000 [IU] | INTRAMUSCULAR | Status: AC
  Administered 2022-06-24: 5000 [IU] via SUBCUTANEOUS
  Filled 2022-06-24: qty 1

## 2022-06-24 MED ORDER — CHLORHEXIDINE GLUCONATE 0.12 % MT SOLN
15.0000 mL | Freq: Once | OROMUCOSAL | Status: AC
  Administered 2022-06-24: 15 mL via OROMUCOSAL

## 2022-06-24 MED ORDER — PHENYLEPHRINE 80 MCG/ML (10ML) SYRINGE FOR IV PUSH (FOR BLOOD PRESSURE SUPPORT)
PREFILLED_SYRINGE | INTRAVENOUS | Status: AC
  Filled 2022-06-24: qty 20

## 2022-06-24 MED ORDER — PROPOFOL 10 MG/ML IV BOLUS
INTRAVENOUS | Status: DC | PRN
  Administered 2022-06-24: 150 mg via INTRAVENOUS

## 2022-06-24 MED ORDER — FLUOXETINE HCL 20 MG PO CAPS
40.0000 mg | ORAL_CAPSULE | Freq: Every day | ORAL | Status: DC
  Administered 2022-06-25: 40 mg via ORAL
  Filled 2022-06-24: qty 2

## 2022-06-24 SURGICAL SUPPLY — 127 items
ADH SKN CLS APL DERMABOND .7 (GAUZE/BANDAGES/DRESSINGS) ×1
BACTOSHIELD CHG 4% 4OZ (MISCELLANEOUS) ×1
BAG COUNTER SPONGE SURGICOUNT (BAG) ×1 IMPLANT
BAG SPNG CNTER NS LX DISP (BAG)
BAG URO CATCHER STRL LF (MISCELLANEOUS) ×1 IMPLANT
BLADE EXTENDED COATED 6.5IN (ELECTRODE) IMPLANT
CANNULA REDUC XI 12-8 STAPL (CANNULA)
CANNULA REDUCER 12-8 DVNC XI (CANNULA) IMPLANT
CELLS DAT CNTRL 66122 CELL SVR (MISCELLANEOUS) IMPLANT
CLOTH BEACON ORANGE TIMEOUT ST (SAFETY) ×1 IMPLANT
COVER SURGICAL LIGHT HANDLE (MISCELLANEOUS) ×3 IMPLANT
COVER TIP SHEARS 8 DVNC (MISCELLANEOUS) ×1 IMPLANT
COVER TIP SHEARS 8MM DA VINCI (MISCELLANEOUS) ×2
DERMABOND ADVANCED (GAUZE/BANDAGES/DRESSINGS) ×1
DERMABOND ADVANCED .7 DNX12 (GAUZE/BANDAGES/DRESSINGS) IMPLANT
DRAIN CHANNEL 19F RND (DRAIN) IMPLANT
DRAPE ARM DVNC X/XI (DISPOSABLE) ×4 IMPLANT
DRAPE COLUMN DVNC XI (DISPOSABLE) ×1 IMPLANT
DRAPE DA VINCI XI ARM (DISPOSABLE) ×8
DRAPE DA VINCI XI COLUMN (DISPOSABLE) ×2
DRAPE SURG IRRIG POUCH 19X23 (DRAPES) ×2 IMPLANT
DRSG OPSITE POSTOP 4X10 (GAUZE/BANDAGES/DRESSINGS) IMPLANT
DRSG OPSITE POSTOP 4X6 (GAUZE/BANDAGES/DRESSINGS) IMPLANT
DRSG OPSITE POSTOP 4X8 (GAUZE/BANDAGES/DRESSINGS) IMPLANT
ELECT PENCIL ROCKER SW 15FT (MISCELLANEOUS) ×1 IMPLANT
ELECT REM PT RETURN 15FT ADLT (MISCELLANEOUS) ×2 IMPLANT
ENDOLOOP SUT PDS II  0 18 (SUTURE)
ENDOLOOP SUT PDS II 0 18 (SUTURE) IMPLANT
EVACUATOR SILICONE 100CC (DRAIN) IMPLANT
GLOVE BIO SURGEON STRL SZ 6.5 (GLOVE) ×6 IMPLANT
GLOVE BIOGEL PI IND STRL 7.0 (GLOVE) ×2 IMPLANT
GLOVE BIOGEL PI INDICATOR 7.0 (GLOVE) ×1
GLOVE INDICATOR 6.5 STRL GRN (GLOVE) ×1 IMPLANT
GLOVE SURG LX 7.5 STRW (GLOVE) ×1
GLOVE SURG LX STRL 7.5 STRW (GLOVE) ×1 IMPLANT
GOWN SRG XL LVL 4 BRTHBL STRL (GOWNS) ×1 IMPLANT
GOWN STRL NON-REIN XL LVL4 (GOWNS)
GOWN STRL REUS W/ TWL LRG LVL3 (GOWN DISPOSABLE) ×2 IMPLANT
GOWN STRL REUS W/ TWL XL LVL3 (GOWN DISPOSABLE) ×4 IMPLANT
GOWN STRL REUS W/TWL LRG LVL3 (GOWN DISPOSABLE) ×4
GOWN STRL REUS W/TWL XL LVL3 (GOWN DISPOSABLE) ×8
GRASPER SUT TROCAR 14GX15 (MISCELLANEOUS) IMPLANT
GUIDEWIRE ANG ZIPWIRE 038X150 (WIRE) ×1 IMPLANT
HOLDER FOLEY CATH W/STRAP (MISCELLANEOUS) ×1 IMPLANT
IRRIG SUCT STRYKERFLOW 2 WTIP (MISCELLANEOUS) ×2
IRRIGATION SUCT STRKRFLW 2 WTP (MISCELLANEOUS) ×1 IMPLANT
IV LACTATED RINGERS 1000ML (IV SOLUTION) ×1 IMPLANT
IV NS IRRIG 3000ML ARTHROMATIC (IV SOLUTION) ×1 IMPLANT
KIT PROCEDURE DA VINCI SI (MISCELLANEOUS) ×2
KIT PROCEDURE DVNC SI (MISCELLANEOUS) IMPLANT
KIT TURNOVER KIT A (KITS) ×1 IMPLANT
MANIFOLD NEPTUNE II (INSTRUMENTS) ×2 IMPLANT
NDL ASPIRATION 22 (NEEDLE) ×1 IMPLANT
NDL HYPO 21X1.5 SAFETY (NEEDLE) IMPLANT
NDL INSUFFLATION 14GA 120MM (NEEDLE) ×1 IMPLANT
NDL SAFETY ECLIPSE 18X1.5 (NEEDLE) IMPLANT
NEEDLE ASPIRATION 22 (NEEDLE) IMPLANT
NEEDLE HYPO 18GX1.5 SHARP (NEEDLE)
NEEDLE HYPO 21X1.5 SAFETY (NEEDLE) ×2 IMPLANT
NEEDLE INSUFFLATION 14GA 120MM (NEEDLE) IMPLANT
PACK CARDIOVASCULAR III (CUSTOM PROCEDURE TRAY) ×1 IMPLANT
PACK COLON (CUSTOM PROCEDURE TRAY) ×1 IMPLANT
PACK CYSTO (CUSTOM PROCEDURE TRAY) ×2 IMPLANT
PAD POSITIONING PINK XL (MISCELLANEOUS) ×2 IMPLANT
PENCIL SMOKE EVACUATOR (MISCELLANEOUS) IMPLANT
RELOAD STAPLE 60 3.5 BLU DVNC (STAPLE) IMPLANT
RELOAD STAPLE 60 4.3 GRN DVNC (STAPLE) IMPLANT
RELOAD STAPLER 3.5X60 BLU DVNC (STAPLE) IMPLANT
RELOAD STAPLER 4.3X60 GRN DVNC (STAPLE) IMPLANT
RETRACTOR WND ALEXIS 18 MED (MISCELLANEOUS) IMPLANT
RTRCTR WOUND ALEXIS 18CM MED (MISCELLANEOUS)
SCISSORS LAP 5X35 DISP (ENDOMECHANICALS) IMPLANT
SCRUB CHG 4% DYNA-HEX 4OZ (MISCELLANEOUS) IMPLANT
SEAL CANN UNIV 5-8 DVNC XI (MISCELLANEOUS) ×3 IMPLANT
SEAL XI 5MM-8MM UNIVERSAL (MISCELLANEOUS) ×8
SEALER VESSEL DA VINCI XI (MISCELLANEOUS)
SEALER VESSEL EXT DVNC XI (MISCELLANEOUS) ×1 IMPLANT
SET TRI-LUMEN FLTR TB AIRSEAL (TUBING) ×1 IMPLANT
SOLUTION ELECTROLUBE (MISCELLANEOUS) ×1 IMPLANT
SPIKE FLUID TRANSFER (MISCELLANEOUS) ×1 IMPLANT
STAPLER 60 DA VINCI SURE FORM (STAPLE)
STAPLER 60 SUREFORM DVNC (STAPLE) IMPLANT
STAPLER CANNULA SEAL DVNC XI (STAPLE) IMPLANT
STAPLER CANNULA SEAL XI (STAPLE)
STAPLER ECHELON POWER CIR 29 (STAPLE) IMPLANT
STAPLER ECHELON POWER CIR 31 (STAPLE) IMPLANT
STAPLER RELOAD 3.5X60 BLU DVNC (STAPLE)
STAPLER RELOAD 3.5X60 BLUE (STAPLE)
STAPLER RELOAD 4.3X60 GREEN (STAPLE)
STAPLER RELOAD 4.3X60 GRN DVNC (STAPLE)
STOPCOCK 4 WAY LG BORE MALE ST (IV SETS) ×2 IMPLANT
SUT ETHILON 2 0 PS N (SUTURE) IMPLANT
SUT NOVA NAB DX-16 0-1 5-0 T12 (SUTURE) ×2 IMPLANT
SUT PROLENE 2 0 KS (SUTURE) IMPLANT
SUT SILK 2 0 (SUTURE)
SUT SILK 2 0 SH CR/8 (SUTURE) IMPLANT
SUT SILK 2-0 18XBRD TIE 12 (SUTURE) ×1 IMPLANT
SUT SILK 3 0 (SUTURE)
SUT SILK 3 0 SH CR/8 (SUTURE) ×1 IMPLANT
SUT SILK 3-0 18XBRD TIE 12 (SUTURE) IMPLANT
SUT V-LOC BARB 180 2/0GR6 GS22 (SUTURE)
SUT VIC AB 2-0 SH 18 (SUTURE) IMPLANT
SUT VIC AB 2-0 SH 27 (SUTURE) ×2
SUT VIC AB 2-0 SH 27X BRD (SUTURE) IMPLANT
SUT VIC AB 3-0 SH 18 (SUTURE) IMPLANT
SUT VIC AB 4-0 PS2 27 (SUTURE) ×4 IMPLANT
SUT VICRYL 0 UR6 27IN ABS (SUTURE) ×2 IMPLANT
SUT VLOC 180 0 9IN  GS21 (SUTURE) ×2
SUT VLOC 180 0 9IN GS21 (SUTURE) IMPLANT
SUT VLOC 180 ABS0 18IN GS21 (SUTURE) ×1 IMPLANT
SUTURE V-LC BRB 180 2/0GR6GS22 (SUTURE) IMPLANT
SYR 10ML ECCENTRIC (SYRINGE) ×1 IMPLANT
SYR CONTROL 10ML LL (SYRINGE) IMPLANT
SYS LAPSCP GELPORT 120MM (MISCELLANEOUS)
SYS RETRIEVAL 5MM INZII UNIV (BASKET) ×2
SYS WOUND ALEXIS 18CM MED (MISCELLANEOUS)
SYSTEM LAPSCP GELPORT 120MM (MISCELLANEOUS) IMPLANT
SYSTEM RETRIEVL 5MM INZII UNIV (BASKET) IMPLANT
SYSTEM WOUND ALEXIS 18CM MED (MISCELLANEOUS) IMPLANT
TOWEL OR 17X26 10 PK STRL BLUE (TOWEL DISPOSABLE) IMPLANT
TOWEL OR NON WOVEN STRL DISP B (DISPOSABLE) ×2 IMPLANT
TRAY FOLEY MTR SLVR 16FR STAT (SET/KITS/TRAYS/PACK) ×2 IMPLANT
TROCAR ADV FIXATION 5X100MM (TROCAR) ×1 IMPLANT
TUBING CONNECTING 10 (TUBING) ×2 IMPLANT
TUBING INSUFFLATION 10FT LAP (TUBING) ×1 IMPLANT
WATER STERILE IRR 1000ML POUR (IV SOLUTION) ×2 IMPLANT
WATER STERILE IRR 3000ML UROMA (IV SOLUTION) ×1 IMPLANT

## 2022-06-24 NOTE — Interval H&P Note (Signed)
History and Physical Interval Note:  06/24/2022 8:15 AM  Elizabeth Bartlett  has presented today for surgery, with the diagnosis of Malignant pelvic mass.  The various methods of treatment have been discussed with the patient and family. After consideration of risks, benefits and other options for treatment, the patient has consented to  Procedure(s): XI ROBOTIC ASSISTED RADICAL TRACHELECTOMY, EXCISION OF A PELVIC MASS, (N/A) UPPER VAGINECTOMY (N/A) POSSIBLE XI ROBOT ASSISTED LAPAROSCOPIC PARTIAL COLECTOMY (N/A) CYSTOSCOPY WITH INJECTION INDOCYANINE GREEN DYE (N/A) as a surgical intervention.  The patient's history has been reviewed, patient examined, no change in status, stable for surgery.  I have reviewed the patient's chart and labs.  Questions were answered to the patient's satisfaction.     Rosario Adie, MD  Col[facility]

## 2022-06-24 NOTE — H&P (Signed)
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  Uterus was ultimately morcellated for removal.   01/31/2013 Pathology Results   Uterus and bilateral fallopian tubes - ENDOMETRIUM: PROLIFERATIVE WITH BENIGN ENDOMETRIAL POLYP. NO HYPERPLASIA OR CARCINOMA. - MYOMETRIUM: ADENOMYOSIS. LEIOMYOMA. - UTERINE SEROSA: UNREMARKABLE. NO ENDOMETRIOSIS OR MALIGNANCY. - BILATERAL FALLOPIAN TUBES: UNREMARKABLE.   12/15/2021 Initial Diagnosis   The patient experienced postmenopausal bleeding.   01/30/2022 Imaging   Pelvic ultrasound exam was performed on 2/10 showing 18 x 18 x 16 cm masslike area within the cervix containing internal blood flow, question cervical fibroids or mass/neoplasm.     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 There is marked thinning of the normal stroma posterolaterally on the right without definite parametrial extension on today's study. No involvement of the rectum. Assessment of the upper posterior bladder wall is limited by surgical scarring and susceptibility artifact from adjacent surgical clips in this region. Within this limitation, no focal bladder  wall thickening or abnormal bladder wall enhancement is appreciated. Tumor extends posteriorly to the level of the external os, but no definite vaginal wall involvement appreciable by MRI.   No evidence for pelvic sidewall lymphadenopathy.     03/25/2022 Cancer Staging   Staging form: Corpus Uteri - Carcinoma and Carcinosarcoma, AJCC 8th Edition - Clinical stage from 03/25/2022: Stage II (cT2, cN0, cM0) - Signed by Heath Lark, MD on 03/25/2022 Stage prefix: Initial diagnosis   03/31/2022 Procedure   Successful placement of a right internal jugular approach power injectable Port-A-Cath. The catheter is ready for immediate use.     04/03/2022 - 05/01/2022 Chemotherapy   Patient is on Treatment Plan : Uterine Cisplatin days 1 and days 29 with radiation     06/02/2022 Imaging   1. Mass of the cervical remnant is unchanged in size, but demonstrates new central low attenuation which is likely due to necrosis.  2. No evidence of metastatic disease in the abdomen or pelvis. 3.  Aortic Atherosclerosis (ICD10-I70.0).     Interval History: Doing well. Denies vaginal bleeding or pain. Reports regular bowel function until started prep in preparation for surgery.  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;  completed radiation both breast 08/ 2016 and completed anastozole 04/ 2021   Depression    Dysuria    Elevated LFTs    FIGO stage II endometrial cancer ([place]) 01/2022   oncologist--- dr gorsuch/ radiation oncology-- dr Sondra Come;   s/p supracervical hysterectomy 02/ 2014 with retained ovaries;  PMB cervical bx endometroid adenocarcinoma ; started chemo 04-03-2022 and completed external beam radiation 05-08-2022 planned  brachytherapy high dose radiation on 05-11-2022   History of external beam radiation therapy    completed 08/ 2016 both breast ;    IMRT completed 05-08-2022 for endometrial carinoma/ cervix   History of kidney stones    History of radiation therapy    Cervix-04/06/22-05/12/22- Dr. Gery Pray   Hyperlipidemia    Hypothyroidism    followed by pcp   Migraines    Nocturia more than twice per night    OSA (obstructive sleep apnea)    05-11-2022  per pt last not used in past 6-8 months (approx 10/ 2022) due to nocutria   Wears glasses     Past Surgical History:  Procedure Laterality Date   BILATERAL SALPINGECTOMY Bilateral 01/31/2013   Procedure: BILATERAL SALPINGECTOMY;  Surgeon: Jonnie Kind, MD;  Location: [facility];  Service: Gynecology;  Laterality: Bilateral;   BREAST LUMPECTOMY WITH AXILLARY LYMPH NODE BIOPSY Bilateral 04/15/2015   @ [facility] in [place];   x2 sites on left, x2 site on right,  left axilla node dissection   CESAREAN SECTION  02/19/1987   CHOLECYSTECTOMY OPEN  05/22/1987   IR IMAGING GUIDED PORT INSERTION  03/30/2022   LAPAROSCOPIC SUPRACERVICAL HYSTERECTOMY N/A 01/31/2013   Procedure: LAPAROSCOPIC SUPRACERVICAL HYSTERECTOMY;  Surgeon: Jonnie Kind, MD;  Location: [facility];  Service: Gynecology;  Laterality: N/A;   OPERATIVE ULTRASOUND N/A 05/12/2022   Procedure: OPERATIVE ULTRASOUND;  Surgeon: Gery Pray, MD;  Location: [facility];  Service: Urology;  Laterality: N/A;   TANDEM RING INSERTION N/A 05/12/2022   Procedure: TANDEM RING INSERTION;  Surgeon: Gery Pray, MD;  Location: [facility];  Service: Urology;  Laterality: N/A;    Family History  Problem Relation Age of Onset   COPD Mother    Cancer Father        lung   Colon cancer Father    Breast cancer Paternal Aunt        dx early 30s   Cirrhosis Maternal Grandmother        non-alcoholic cirrhosis   Heart attack Maternal Grandfather    Diabetes Maternal Grandfather     Colon cancer Paternal Grandfather        dx 73s   Lymphoma Cousin    Ovarian cancer Neg Hx    Endometrial cancer Neg Hx    Pancreatic cancer Neg Hx    Prostate cancer Neg Hx     Social History   Socioeconomic History   Marital status: Married    Spouse name: Not on file   Number of children: Not on file   Years of education: Not on file   Highest education level: Not on file  Occupational History   Occupation: substitute teacher  Tobacco Use   Smoking status: Never   Smokeless tobacco: Never  Vaping Use   Vaping Use: Never used  Substance and Sexual Activity   Alcohol use: No   Drug use: Never   Sexual activity: Not Currently    Partners: Male  Other Topics Concern   Not on file  Social History Narrative   Not on file   Social Determinants of Health   Financial Resource Strain: Not on file  Food Insecurity: Not on file  Transportation Needs: Not on file  Physical Activity: Not on file  Stress: Not on file  Social Connections: Not on file    Current Medications:  Current  Facility-Administered Medications:    acetaminophen (TYLENOL) tablet 1,000 mg, 1,000 mg, Oral, On Call to OR, Cross, Melissa D, NP   alvimopan (ENTEREG) capsule 12 mg, 12 mg, Oral, On Call to OR, Leighton Ruff, MD   cefoTEtan (CEFOTAN) 2 g in sodium chloride 0.9 % 100 mL IVPB, 2 g, Intravenous, On Call to OR, Cross, Melissa D, NP   celecoxib (CELEBREX) capsule 200 mg, 200 mg, Oral, On Call to OR, Cross, Melissa D, NP   chlorhexidine (PERIDEX) 0.12 % solution 15 mL, 15 mL, Mouth/Throat, Once **OR** Oral care mouth rinse, 15 mL, Mouth Rinse, Once, Norton Blizzard R, MD   dexamethasone (DECADRON) injection 4 mg, 4 mg, Intravenous, On Call to OR, Cross, Melissa D, NP   gabapentin (NEURONTIN) capsule 300 mg, 300 mg, Oral, On Call to OR, Cross, Melissa D, NP   heparin injection 5,000 Units, 5,000 Units, Subcutaneous, 120 min pre-op, Cross, Melissa D, NP   lactated ringers infusion, , Intravenous,  Continuous, Norton Blizzard R, MD   scopolamine (TRANSDERM-SCOP) 1 MG/3DAYS 1.5 mg, 1 patch, Transdermal, Once, Brennan Bailey, MD  Review of Systems: Denies appetite changes, fevers, chills, fatigue, unexplained weight changes. Denies hearing loss, neck lumps or masses, mouth sores, ringing in ears or voice changes. Denies cough or wheezing.  Denies shortness of breath. Denies chest pain or palpitations. Denies leg swelling. Denies abdominal distention, pain, blood in stools, constipation, diarrhea, nausea, vomiting, or early satiety. Denies pain with intercourse, dysuria, frequency, hematuria or incontinence. Denies hot flashes, pelvic pain, vaginal bleeding or vaginal discharge.   Denies joint pain, back pain or muscle pain/cramps. Denies itching, rash, or wounds. Denies dizziness, headaches, numbness or seizures. Denies swollen lymph nodes or glands, denies easy bruising or bleeding. Denies anxiety, depression, confusion, or decreased concentration.  Physical Exam: Ht 5' (1.524 m)   Wt 164 lb 3.9 oz (74.5 kg)   LMP 01/27/2013   BMI 32.08 kg/m  General: Alert, oriented, no acute distress.  HEENT: Normocephalic, atraumatic. Sclera anicteric.  Chest: Clear to auscultation bilaterally. No wheezes, rhonchi, or rales. Cardiovascular: Regular rate and rhythm, no murmurs, rubs, or gallops.  Abdomen: Normoactive bowel sounds. Soft, nondistended, nontender to palpation. No masses or hepatosplenomegaly appreciated. No palpable fluid wave.  Extremities: Grossly normal range of motion. Warm, well perfused. No edema bilaterally.  Skin: No rashes or lesions.   Laboratory & Radiologic Studies:    Latest Ref Rng & Units 06/15/2022   10:41 AM 05/04/2022    9:33 AM 04/27/2022    9:33 AM  CBC  WBC 4.0 - 10.5 K/uL 6.2  7.4  4.8   Hemoglobin 12.0 - 15.0 g/dL 12.4  12.0  11.7   Hematocrit 36.0 - 46.0 % 38.2  35.9  35.5   Platelets 150 - 400 K/uL 320  272  184       Latest Ref Rng & Units  06/15/2022   10:41 AM 06/01/2022    9:33 AM 05/04/2022    9:33 AM  BMP  Glucose 70 - 99 mg/dL 93  100  99   BUN 8 - 23 mg/dL 14  12  11    Creatinine 0.44 - 1.00 mg/dL 0.80  0.76  0.75   Sodium 135 - 145 mmol/L 139  140  139   Potassium 3.5 - 5.1 mmol/L 4.0  4.0  3.7   Chloride 98 - 111 mmol/L 107  103  100   CO2 22 - 32 mmol/L 24  30  33   Calcium  8.9 - 10.3 mg/dL 9.0  9.3  8.8    Assessment & Plan: Elizabeth Bartlett is a 61 y.o. 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  Plan for robotic trachelectomy, bilateral oophorectomy, possible bowel resection, and any other indicated procedures. Reviewed surgical plan again today. Discussed possible risks of surgery which include but are not limited to infection; wound separation; hernia; vaginal cuff separation (discussed increased risk of this in the setting of recent radiation), injury to adjacent organs such as bowel, bladder, blood vessels, ureters and nerves; bleeding which may require blood transfusion; anesthesia risk; thromboembolic events; possible death; unforeseen complications; possible need for re-exploration; medical complications such as heart attack, stroke, pleural effusion and pneumonia. The patient will receive DVT and antibiotic prophylaxis as indicated. She voiced a clear understanding.   Jeral Pinch, MD  Division of Gynecologic Oncology  Department of Obstetrics and Gynecology  [facility]

## 2022-06-24 NOTE — Anesthesia Postprocedure Evaluation (Signed)
Anesthesia Post Note  Patient: Elizabeth Bartlett  Procedure(s) Performed: XI ROBOTIC ASSISTED MODIFIED RADICAL TRACHELECTOMY AND CYSTOSCOPY UPPER VAGINECTOMY CYSTOSCOPY WITH INJECTION INDOCYANINE GREEN DYE     Patient location during evaluation: PACU Anesthesia Type: General Level of consciousness: awake and alert Pain management: pain level controlled Vital Signs Assessment: post-procedure vital signs reviewed and stable Respiratory status: spontaneous breathing, nonlabored ventilation and respiratory function stable Cardiovascular status: blood pressure returned to baseline Postop Assessment: no apparent nausea or vomiting Anesthetic complications: no   No notable events documented.  Last Vitals:  Vitals:   06/24/22 1415 06/24/22 1500  BP:  (!) 151/84  Pulse: 93 94  Resp: 17 17  Temp:    SpO2: 97% 97%    Last Pain:  Vitals:   06/24/22 1500  TempSrc:   PainSc: 0-No pain                 Marthenia Rolling

## 2022-06-24 NOTE — Anesthesia Procedure Notes (Addendum)
Procedure Name: Intubation Date/Time: 06/24/2022 8:33 AM  Performed by: Garrel Ridgel, CRNAPre-anesthesia Checklist: Patient identified, Emergency Drugs available, Suction available and Patient being monitored Patient Re-evaluated:Patient Re-evaluated prior to induction Oxygen Delivery Method: Circle system utilized Preoxygenation: Pre-oxygenation with 100% oxygen Induction Type: IV induction Ventilation: Mask ventilation without difficulty Laryngoscope Size: Mac and 3 Grade View: Grade II Tube type: Oral Tube size: 7.0 mm Number of attempts: 1 Airway Equipment and Method: Stylet and Oral airway Placement Confirmation: ETT inserted through vocal cords under direct vision, positive ETCO2 and breath sounds checked- equal and bilateral Secured at: 22 cm Tube secured with: Tape Dental Injury: Teeth and Oropharynx as per pre-operative assessment

## 2022-06-24 NOTE — Discharge Instructions (Addendum)
AFTER SURGERY INSTRUCTIONS   Return to work: 4-6 weeks if applicable  Plan to have the foley catheter in place for one week. We will have you come back to the office in one week for removal and for a voiding trial.    Activity: 1. Be up and out of the bed during the day.  Take a nap if needed.  You may walk up steps but be careful and use the hand rail.  Stair climbing will tire you more than you think, you may need to stop part way and rest.    2. No lifting or straining for 6 weeks over 10 pounds. No pushing, pulling, straining for 6 weeks.   3. No driving for around 1 week(s).  Do not drive if you are taking narcotic pain medicine and make sure that your reaction time has returned.    4. You can shower as soon as the next day after surgery. Shower daily.  Use your regular soap and water (not directly on the incision) and pat your incision(s) dry afterwards; don't rub.  No tub baths or submerging your body in water until cleared by your surgeon. If you have the soap that was given to you by pre-surgical testing that was used before surgery, you do not need to use it afterwards because this can irritate your incisions.    5. No sexual activity and nothing in the vagina for 8 weeks.   6. You may experience a small amount of clear drainage from your incisions, which is normal.  If the drainage persists, increases, or changes color please call the office.   7. Do not use creams, lotions, or ointments such as neosporin on your incisions after surgery until advised by your surgeon because they can cause removal of the dermabond glue on your incisions.     8. You may experience vaginal spotting after surgery or around the 6-8 week mark from surgery when the stitches at the top of the vagina begin to dissolve.  The spotting is normal but if you experience heavy bleeding, call our office.   9. Take Tylenol or ibuprofen (OR MOBIC, do not take together) first for pain if you are able to take these  medications and only use narcotic pain medication for severe pain not relieved by the Tylenol or Ibuprofen.  Monitor your Tylenol intake to a max of 4,000 mg in a 24 hour period. You can alternate these medications after surgery.   Diet: 1. Low sodium Heart Healthy Diet is recommended but you are cleared to resume your normal (before surgery) diet after your procedure.   2. It is safe to use a laxative, such as Miralax or Colace, if you have difficulty moving your bowels. You have been prescribed Sennakot-S to take at bedtime every evening after surgery to keep bowel movements regular and to prevent constipation.    Wound Care: 1. Keep clean and dry.  Shower daily.   Reasons to call the Doctor: Fever - Oral temperature greater than 100.4 degrees Fahrenheit Foul-smelling vaginal discharge Difficulty urinating Nausea and vomiting Increased pain at the site of the incision that is unrelieved with pain medicine. Difficulty breathing with or without chest pain New calf pain especially if only on one side Sudden, continuing increased vaginal bleeding with or without clots.   Contacts: For questions or concerns you should contact:   Dr. Jeral Pinch at [phone]   Joylene John, NP at [phone]   After Hours: call [phone] and have the  GYN Oncologist paged/contacted (after 5 pm or on the weekends).   Messages sent via mychart are for non-urgent matters and are not responded to after hours so for urgent needs, please call the after hours number.

## 2022-06-24 NOTE — Progress Notes (Signed)
Patient declines nocturnal CPAP tonight and denies home use for the past 2 years.

## 2022-06-24 NOTE — Op Note (Signed)
Preoperative diagnosis:  1. Ureteral identification for pelvic surgery  Postoperative diagnosis: 1. same  Procedure(s): 1. Cystoscopy with bilateral ureteral catheterization for firefly injection  Surgeon: Dr. Donald Pore  Anesthesia: general  Complications: none  EBL: <1 cc  Intraoperative findings: unremarkable appearing bladder  Indication: identification of ureters for abdominal/pelvic surgery  Description of procedure:   With appropriate consent having been obtained, the patient was brought to the operative suite. Anesthesia was induced and the patient was prepped and draped in the usual sterile fashion. A timeout was performed  Thereafter the rigid cystoscope was carefully inserted into the bladder. The bladder was inspected thoroughly and no abnormalities were identified.   The right ureteral orifice was identified and cannulated with a 6 fr open ended ureteral catheter. It was easily advanced to the level of the collecting system. The ureteral catheter was then slowly withdrawn while simultaneously injecting the firefly solution over the length of the ureter. Approximately 8 cc total were injected.  The procedure was then repeated in an identical fashion on the left.   A 16 fr foley was then placed in a sterile fashion with immediate return of clear blue/green urine, and the balloon was inflated with 10 cc sterile water.   Donald Pore MD 06/24/2022, 9:08 AM  [facility]  Pager: [phone]

## 2022-06-24 NOTE — Op Note (Signed)
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   Anesthesia: GET  Urine Output: 300cc  Operative Findings:  On exam under anesthesia, mild firmness of the cervix noted.  No vaginal involvement or parametrial nodularity.  On intra-abdominal entry, normal upper abdominal survey although some adhesions of the omentum to the anterior abdominal wall in the right upper quadrant consistent with prior cholecystectomy history. Normal-appearing small and large bowel.  No ascites.  Some adhesions of the sigmoid colon to the cervical stump for multiple metal clips had been used at the time of patient's prior surgery. Sigmoid also adherent to the left pelvic sidewall and the left adnexa.  Bilateral ovaries normal in appearance as well as left fallopian tube.  No obvious adenopathy.  Bladder adherent over the cervical stump, hesitating backfilling the bladder several times during the procedure to help identify the plane.  Due to concern for close vaginal margin after removal of the specimen, additional vaginal margin taken anteriorly and posteriorly.  No intra-abdominal or pelvic evidence of metastatic disease noted.  On cystoscopy, good efflux noted from bilateral ureteral orifices.   Estimated Blood Loss:  less than 100 mL      Total IV Fluids: see I&O  flowsheet         Specimens: Right ovary, left fallopian tube and ovary, cervix and parametria, upper vagina         Complications:  None apparent; patient tolerated the procedure well.         Disposition: PACU - hemodynamically stable.  Procedure Details  The patient was seen in the Holding Room. The risks, benefits, complications, treatment options, and expected outcomes were discussed with the patient.  The patient concurred with the proposed plan, giving informed consent.  The site of surgery properly noted/marked. The patient was identified as Earley Abide and the procedure verified as a Robotic-assisted modified radical trachelectomy, bilateral oophorectomy, possible bowel resection, and any other Moceri procedures.  After induction of anesthesia, the patient was draped and prepped in the usual sterile manner. Patient was placed in supine position after anesthesia and draped and prepped in the usual sterile manner as follows: Her arms were tucked to her side with all appropriate precautions.  The shoulders were stabilized with padded shoulder blocks applied to the acromium processes.  The patient was placed in the semi-lithotomy position in [place].  The perineum and vagina were prepped with CholoraPrep. The patient was draped after the CholoraPrep had been allowed to dry for 3 minutes.  A Time Out was held and the above information confirmed.  The urethra was prepped with Betadine.  Dr. Cain Sieve was present to perform cystoscopy with intra-ureteral patient of ICG bilaterally.  Please see his operative note.  Foley catheter was then placed in the bladder. OG tube placement was confirmed and to suction.   Next, a 10 mm skin incision was made 1 cm below the subcostal margin in the midclavicular line.  The 5 mm Optiview port and scope was used for direct entry.  Opening  pressure was under 10 mm CO2.  The abdomen was insufflated and the findings were noted as above.   At this point and all points  during the procedure, the patient's intra-abdominal pressure did not exceed 15 mmHg. Next, an 8 mm skin incision was made superior the umbilicus and a right and left port were placed about 8 cm lateral to the robot port on the right and left side.  A fourth arm was placed on the right.  Prior to placement of the supraumbilical port, monopolar scissors were used to lyse adhesions of the omentum to the anterior abdominal wall along the midline to facilitate placement of the port at the umbilicus.  The port was then placed under direct visualization.  The 5 mm assist trocar was exchanged for a 10-12 mm port. All ports were placed under direct visualization.  The patient was placed in steep Trendelenburg.  The robot was docked in the normal manner.  Attention was then turned to the pelvis.  Adhesions were noted between the sigmoid and the cervical stump.  With a combination of blunt dissection and sharp dissection, these were carefully lysed, ultimately freeing the sigmoid colon.  This was inspected, both by myself as well as Dr. Leighton Ruff, with no injury noted to the serosa of the sigmoid.  The right retroperitoneum was then opened parallel to the IP ligament and the right ovary was elevated. An incision was made in the broad ligament above the ureter to isolate the IP ligament, which was skeletonized, cauterized and transected. The right adnexa was placed in an Endocatch bag.   The same was performed on th left after the adhesions of the sigmoid epiploica to the left sidewall were lysed. The left fallopian tube and ovary were adherent to the sigmoid mesentery and a combination of sharp dissection and bursts of monopolar electrocautery were used to lyse these adhesions. Once freed, the IP ligament was isolated, cauterized, and transected. The left adnexa was placed in an Endocatch bag.   The right paravesical space was developed with monopolar and sharp dissection. It was held open with tension on the  median umbilical ligament with the forth arm. The pararectal space was opened with blunt and sharp dissection to mobilize the ureter off of the medial surface of the internal iliac artery.  The same procedure with the same steps was performed on the patient's left side.   The right uterine artery was skeletonized at its origin from the internal iliac artery by skeletonizing it 360 degrees and defining the parauterine web between the paravesical and pararectal spaces. The uterine artery was then cauterized and transected while maintaining median traction on the ureter. The uterine vein was also sealed and resected. The right ureter was not skeletonized 360 degrees off the broad ligament given recent radiation but it was untunnelled from under the right uterine artery. The same procedure was then performed on the left.   Given degree of adhesion of the bladder to the cervical stump, there was some difficulty defining the plane between the bladder and cervix. This required backfilling the bladder multiple times. The bladder flap was developed down to below the level of the tumor and below the rounded portion of the EEA sizer. This then definied the anterior bladder pillar. The lateral boundary of the parametrium was taken down with biploar and monopolar dissection. The uterine vessels were retracted superior and medially over the ureter on the right. The ureter was untunnelled through to its entry into the bladder with  meticulous sharp dissection and short bursts of monopolar energy. The ureter was dissected off of its attachments to the anterior vagina. The anterior bladder pillar was skeletonized and sealed with bipolar energy while the ureter was deflected distally. The same procedure was then performed on the left.   The rectovaginal septum was entered posteriorally with sharp dissection and the rectum was dissected off of the vagina. The posterior peritoneum was taken down to the level of the uterosacral  ligament bilaterally. The right uterosacral ligament was transected with bipolar and monopolar energy approximately 1/2 a centimeter from the cervix. The right paravaginal tissues were tubularized around the vagina on the right at the inferior boundary of the dissection. The same was then performed on the left.   The colpotomy was made and the cervical stump was amputated and delivered through the vagina.  Pedicles were inspected and excellent hemostasis was achieved.  After inspecting the specimen, decision was made to take additional vaginal margin anteriorly and posteriorly.  The bladder was dissected further off of the vagina anteriorly and the rectovaginal septum was further developed posteriorly.  An additional margin was then taken circumferentially using monopolar electrocautery.  The colpotomy at the vaginal cuff was closed with 0 V-Loc in a running manner.  Several figure-of-eight sutures using 0 Vicryl were then used to close the peritoneum and reinforce the cuff closure along the midline.  Irrigation was used and excellent hemostasis was achieved.    Cystoscopy was then performed after instilling approximately 200 cc of sterile fluid into the bladder.  No suture was noted in the bladder.  Bladder dome was intact.  Bilateral efflux was noted from ureteral orifices.  Foley catheter was replaced.  Attention was turned back to the pelvis.  Additional irrigation was performed to assure hemostasis.  At this point in the procedure was completed.  Robotic instruments were removed under direct visulaization.  The robot was undocked. The 10 mm ports were closed with Vicryl on a UR-5 needle and the fascia was closed with 0 Vicryl on a UR-5 needle.  The skin was closed with 4-0 Vicryl in a subcuticular manner.  Dermabond was applied.  Sponge, lap and needle counts correct x 2.  The patient was taken to the recovery room in stable condition.  The vagina was swabbed with minimal bleeding noted. All  instrument and needle counts were correct x 3.   The patient was transferred to the recovery room in stable condition.  Jeral Pinch, MD

## 2022-06-24 NOTE — Brief Op Note (Signed)
06/24/2022  12:19 PM  PATIENT:  Elizabeth Bartlett  61 y.o. female  PRE-OPERATIVE DIAGNOSIS:  Malignant pelvic mass  POST-OPERATIVE DIAGNOSIS:  Malignant pelvic mass  PROCEDURE:  Procedure(s): XI ROBOTIC ASSISTED MODIFIED RADICAL TRACHELECTOMY AND CYSTOSCOPY (N/A) UPPER VAGINECTOMY (N/A) CYSTOSCOPY WITH INJECTION INDOCYANINE GREEN DYE (N/A)  SURGEON:  Surgeon(s) and Role: Panel 1:    Lafonda Mosses, MD - Primary    * Lahoma Crocker, MD - Assisting Panel 2:    * Vira Agar, MD - Primary  ANESTHESIA:   general  EBL:  75 mL   BLOOD ADMINISTERED:none  DRAINS: none   LOCAL MEDICATIONS USED:  MARCAINE     SPECIMEN:  No Specimen  DISPOSITION OF SPECIMEN:  PATHOLOGY  COUNTS:  NO    TOURNIQUET:  * No tourniquets in log *  DICTATION: .Note written in EPIC  PLAN OF CARE: Admit for overnight observation  PATIENT DISPOSITION:  PACU - hemodynamically stable.   Delay start of Pharmacological VTE agent (>24hrs) due to surgical blood loss or risk of bleeding: not applicable

## 2022-06-24 NOTE — Transfer of Care (Signed)
Immediate Anesthesia Transfer of Care Note  Patient: Elizabeth Bartlett  Procedure(s) Performed: XI ROBOTIC ASSISTED MODIFIED RADICAL TRACHELECTOMY AND CYSTOSCOPY UPPER VAGINECTOMY CYSTOSCOPY WITH INJECTION INDOCYANINE GREEN DYE  Patient Location: PACU  Anesthesia Type:General  Level of Consciousness: oriented, drowsy and patient cooperative  Airway & Oxygen Therapy: Patient Spontanous Breathing and Patient connected to face mask oxygen  Post-op Assessment: Report given to RN and Post -op Vital signs reviewed and stable  Post vital signs: Reviewed and stable  Last Vitals:  Vitals Value Taken Time  BP 149/84 06/24/22 1230  Temp    Pulse 96 06/24/22 1232  Resp 16 06/24/22 1232  SpO2 98 % 06/24/22 1232  Vitals shown include unvalidated device data.  Last Pain:  Vitals:   06/24/22 0712  TempSrc: Oral  PainSc:          Complications: No notable events documented.

## 2022-06-25 ENCOUNTER — Encounter (HOSPITAL_COMMUNITY): Payer: Self-pay | Admitting: Gynecologic Oncology

## 2022-06-25 DIAGNOSIS — C539 Malignant neoplasm of cervix uteri, unspecified: Secondary | ICD-10-CM | POA: Diagnosis not present

## 2022-06-25 LAB — BASIC METABOLIC PANEL
Anion gap: 8 (ref 5–15)
BUN: 8 mg/dL (ref 8–23)
CO2: 24 mmol/L (ref 22–32)
Calcium: 8.5 mg/dL — ABNORMAL LOW (ref 8.9–10.3)
Chloride: 109 mmol/L (ref 98–111)
Creatinine, Ser: 0.85 mg/dL (ref 0.44–1.00)
GFR, Estimated: >60 mL/min (ref >60)
Glucose, Bld: 144 mg/dL — ABNORMAL HIGH (ref 70–99)
Potassium: 3.7 mmol/L (ref 3.5–5.1)
Sodium: 141 mmol/L (ref 135–145)

## 2022-06-25 LAB — CBC
HCT: 34 % — ABNORMAL LOW (ref 36.0–46.0)
Hemoglobin: 11.1 g/dL — ABNORMAL LOW (ref 12.0–15.0)
MCH: 31.7 pg (ref 26.0–34.0)
MCHC: 32.6 g/dL (ref 30.0–36.0)
MCV: 97.1 fL (ref 80.0–100.0)
Platelets: 260 10*3/uL (ref 150–400)
RBC: 3.5 MIL/uL — ABNORMAL LOW (ref 3.87–5.11)
RDW: 14.8 % (ref 11.5–15.5)
WBC: 12.9 10*3/uL — ABNORMAL HIGH (ref 4.0–10.5)
nRBC: 0 % (ref 0.0–0.2)

## 2022-06-25 MED ORDER — CHLORHEXIDINE GLUCONATE CLOTH 2 % EX PADS: 6.0000 | MEDICATED_PAD | Freq: Every day | CUTANEOUS | Status: DC

## 2022-06-25 NOTE — Discharge Summary (Signed)
Physician Discharge Summary  Patient ID: Elizabeth Bartlett MRN: [id number] DOB/AGE: 62-Apr-1962 62 y.o.  Admit date: 06/24/2022 Discharge date: 06/25/2022  Admission Diagnoses: Gynecologic malignancy [facility])  Discharge Diagnoses:  Principal Problem:   Gynecologic malignancy [facility]) Active Problems:   Endometrial adenocarcinoma [facility])   Discharged Condition:  The patient is in good condition and stable for discharge.    Hospital Course: On 06/24/2022, the patient underwent the following: Procedure(s): XI ROBOTIC ASSISTED MODIFIED RADICAL TRACHELECTOMY AND CYSTOSCOPY UPPER VAGINECTOMY CYSTOSCOPY WITH INJECTION INDOCYANINE GREEN DYE. The postoperative course was uneventful.  She was discharged to home on postoperative day 1 tolerating a regular diet, ambulating, pain controlled, foley in place with removal/voiding trial scheduled in one week.   Consults: None  Significant Diagnostic Studies: Am labs  Treatments: Surgery: see above  Discharge Exam: Blood pressure 134/67, pulse 87, temperature 98.5 F (36.9 C), temperature source Oral, resp. rate 19, height 5' (1.524 m), weight 164 lb 3.9 oz (74.5 kg), last menstrual period 01/27/2013, SpO2 96 %. General: alert, cooperative, and no distress Resp: clear to auscultation bilaterally Cardio: regular rate and rhythm, S1, S2 normal, no murmur, click, rub or gallop GI: incision: lap sites to the abdomen with dermabond intact with no drainage present and abdomen soft, active bowel sounds, non tender Extremities: extremities normal, atraumatic, no cyanosis or edema Around 200 cc of clear, yellow urine in the foley bag.   Disposition: Discharge disposition: 01-Home or Self Care       Discharge Instructions     Call MD for:  difficulty breathing, headache or visual disturbances   Complete by: As directed    Call MD for:  extreme fatigue   Complete by: As directed    Call MD for:  hives   Complete by: As directed    Call MD for:  persistant  dizziness or light-headedness   Complete by: As directed    Call MD for:  persistant nausea and vomiting   Complete by: As directed    Call MD for:  redness, tenderness, or signs of infection (pain, swelling, redness, odor or green/yellow discharge around incision site)   Complete by: As directed    Call MD for:  severe uncontrolled pain   Complete by: As directed    Call MD for:  temperature >100.4   Complete by: As directed    Diet - low sodium heart healthy   Complete by: As directed    Discharge wound care:   Complete by: As directed    Keep incisions clean and dry   Driving Restrictions   Complete by: As directed    No driving for around 1 week(s).  Do not take narcotics and drive. You need to make sure your reaction time has returned.   Increase activity slowly   Complete by: As directed    Lifting restrictions   Complete by: As directed    No lifting greater than 10 lbs, pushing, pulling, straining for 6 weeks.   Sexual Activity Restrictions   Complete by: As directed    No sexual activity, nothing in the vagina, for 8 weeks.      Allergies as of 06/25/2022   No Known Allergies      Medication List     TAKE these medications    acetaminophen 500 MG tablet Commonly known as: TYLENOL Take 1,000 mg by mouth every 6 (six) hours as needed for moderate pain.   buPROPion 150 MG 12 hr tablet Commonly known as: WELLBUTRIN SR Take  150 mg by mouth 2 (two) times daily.   famotidine 20 MG tablet Commonly known as: PEPCID Take 1 tablet (20 mg total) by mouth 2 (two) times daily. What changed:  when to take this reasons to take this   FLUoxetine 40 MG capsule Commonly known as: PROZAC Take 40 mg by mouth daily.   fluticasone 50 MCG/ACT nasal spray Commonly known as: FLONASE Place 2 sprays into both nostrils daily as needed for allergies.   levothyroxine 25 MCG tablet Commonly known as: SYNTHROID Take 25 mcg by mouth daily.   lidocaine-prilocaine cream Commonly  known as: EMLA Apply 1 Application topically as needed (prior to port access).   ondansetron 8 MG tablet Commonly known as: ZOFRAN Take 8 mg by mouth every 8 (eight) hours as needed for nausea or vomiting.   prochlorperazine 10 MG tablet Commonly known as: COMPAZINE Take 10 mg by mouth every 6 (six) hours as needed for nausea or vomiting.   senna-docusate 8.6-50 MG tablet Commonly known as: Senokot-S Take 2 tablets by mouth at bedtime. For AFTER surgery, do not take if having diarrhea   traMADol 50 MG tablet Commonly known as: ULTRAM Take 1 tablet (50 mg total) by mouth every 6 (six) hours as needed for severe pain. For AFTER surgery only, do not take and drive               Discharge Care Instructions  (From admission, onward)           Start     Ordered   06/25/22 0000  Discharge wound care:       Comments: Keep incisions clean and dry   06/25/22 1121            Follow-up Information     Lafonda Mosses, MD Follow up on 07/01/2022.   Specialty: Gynecologic Oncology Why: at 4pm will be a PHONE visit to discuss pathology and check in with Dr. Berline Lopes. IN PERSON visit will be on 07/20/22 at 1pm at the [facility] for post-op check. Contact information: [place] [address] [place] [facility] Follow up on 07/01/2022.   Specialty: Gynecologic Oncology Why: at 12pm at the [facility] for foley catheter removal. Contact information: [place] [id number] [place] [phone]                Greater than thirty minutes were spend for face to face discharge instructions and discharge orders/summary in EPIC.   Signed: Dorothyann Gibbs 06/25/2022, 11:23 AM

## 2022-06-25 NOTE — Progress Notes (Signed)
Nurse reviewed discharge instructions with pt.  Pt verbalized understanding of discharge instructions and follow up appointments. Pt discharged with foley catheter with no concerns.

## 2022-06-25 NOTE — Progress Notes (Signed)
1 Day Post-Op Procedure(s) (LRB): XI ROBOTIC ASSISTED MODIFIED RADICAL TRACHELECTOMY AND CYSTOSCOPY (N/A) UPPER VAGINECTOMY (N/A) CYSTOSCOPY WITH INJECTION INDOCYANINE GREEN DYE (N/A)  Subjective: Patient reports doing well this am. Having mild soreness, rating 2-3. Tolerating diet and liquids with no nausea or emesis. Passing flatus. Has ambulated in the halls and tolerated well. Denies chest pain, dyspnea. Daughter at the bedside. No concerns voiced.   Objective: Vital signs in last 24 hours: Temp:  [97.2 F (36.2 C)-98.9 F (37.2 C)] 98.5 F (36.9 C) (07/06 0534) Pulse Rate:  [87-104] 87 (07/06 0534) Resp:  [13-22] 19 (07/06 0534) BP: (127-157)/(67-86) 134/67 (07/06 0534) SpO2:  [91 %-100 %] 96 % (07/06 0534) Last BM Date : 06/23/22  Intake/Output from previous day: 07/05 0701 - 07/06 0700 In: 3807.9 [P.O.:720; I.V.:2987.9; IV Piggyback:100] Out: 3025 [Urine:2950; Blood:75]  Physical Examination: General: alert, cooperative, and no distress Resp: clear to auscultation bilaterally Cardio: regular rate and rhythm, S1, S2 normal, no murmur, click, rub or gallop GI: incision: lap sites to the abdomen with dermabond intact with no drainage present and abdomen soft, active bowel sounds, non tender Extremities: extremities normal, atraumatic, no cyanosis or edema Around 200 cc of clear, yellow urine in the foley bag. Demonstrated foley care and changed foley bag to new large bag.  Labs: WBC/Hgb/Hct/Plts:  12.9/11.1/34.0/260 (07/06 3559) BUN/Cr/glu/ALT/AST/amyl/lip:  8/0.85/--/--/--/--/-- (07/06 7416)  Assessment: 61 y.o. s/p Procedure(s): XI ROBOTIC ASSISTED MODIFIED RADICAL TRACHELECTOMY AND CYSTOSCOPY UPPER VAGINECTOMY CYSTOSCOPY WITH INJECTION INDOCYANINE GREEN DYE: stable Pain:  Pain is well-controlled on PRN medications.  Heme: Hgb 11.1 and Hct 34.0. Appropriate given preop values and surgical losses.   ID:  WBC count 12.9 this am. No evidence of infection. Patient  received decadron intraop along with cefotetan IV.  CV: BP and HR stable. Continue to monitor with ordered vital signs while inpt.  GI:  Tolerating po: Yes. Antiemetics ordered as needed.  GU: Adequate urine output reported. Creatinine at 0.85 this am. Plan to foley to remain in place for 1 week.    FEN: No critical values this am.  Prophylaxis: Lovenox and SCDs.  Plan: Saline lock IV Diet as tolerated Foley care discussed and supplies given Encourage IS use, ambulation Plan for discharge later today if meeting milestones, doing well When discharged, pt will be discharged to home Continue plan of care per Dr. Berline Lopes A one week follow up appt will be arranged with the office for foley removal and voiding trial   LOS: 0 days    Alin Chavira D Phelan Schadt 06/25/2022, 9:18 AM

## 2022-06-26 ENCOUNTER — Telehealth: Payer: Self-pay | Admitting: *Deleted

## 2022-06-26 NOTE — Telephone Encounter (Signed)
Spoke with Ms. Tangredi this morning. She states she is eating, drinking and urinating well into her foley. She has had a BM and is passing gas. She is taking senokot as prescribed and encouraged her to drink plenty of water. She denies fever or chills. She rates her pain 0/10 and just endorses soreness. Her pain is controlled with Tramadol. Educated pt on frozen peas as an ice pack.   Instructed to call office with any fever, chills, purulent drainage, uncontrolled pain or any other questions or concerns. Patient verbalizes understanding.   Pt aware of post op appointments as well as the office number [phone] and after hours number [phone] to call if she has any questions or concerns

## 2022-07-01 ENCOUNTER — Inpatient Hospital Stay (HOSPITAL_BASED_OUTPATIENT_CLINIC_OR_DEPARTMENT_OTHER): Payer: BC Managed Care – PPO

## 2022-07-01 ENCOUNTER — Encounter: Payer: Self-pay | Admitting: Gynecologic Oncology

## 2022-07-01 ENCOUNTER — Inpatient Hospital Stay: Payer: BC Managed Care – PPO | Attending: Gynecologic Oncology | Admitting: Gynecologic Oncology

## 2022-07-01 ENCOUNTER — Other Ambulatory Visit: Payer: Self-pay

## 2022-07-01 VITALS — BP 120/63 | HR 96 | Temp 98.0°F | Resp 16 | Ht 60.0 in | Wt 160.5 lb

## 2022-07-01 DIAGNOSIS — C579 Malignant neoplasm of female genital organ, unspecified: Secondary | ICD-10-CM

## 2022-07-01 DIAGNOSIS — C541 Malignant neoplasm of endometrium: Secondary | ICD-10-CM | POA: Insufficient documentation

## 2022-07-01 DIAGNOSIS — Z9221 Personal history of antineoplastic chemotherapy: Secondary | ICD-10-CM | POA: Insufficient documentation

## 2022-07-01 DIAGNOSIS — Z923 Personal history of irradiation: Secondary | ICD-10-CM | POA: Insufficient documentation

## 2022-07-01 DIAGNOSIS — C539 Malignant neoplasm of cervix uteri, unspecified: Secondary | ICD-10-CM

## 2022-07-01 DIAGNOSIS — R350 Frequency of micturition: Secondary | ICD-10-CM | POA: Insufficient documentation

## 2022-07-01 DIAGNOSIS — Z853 Personal history of malignant neoplasm of breast: Secondary | ICD-10-CM | POA: Insufficient documentation

## 2022-07-01 DIAGNOSIS — Z7189 Other specified counseling: Secondary | ICD-10-CM

## 2022-07-01 LAB — SURGICAL PATHOLOGY

## 2022-07-01 NOTE — Progress Notes (Signed)
Gynecologic Oncology Telehealth Note: Gyn-Onc  I connected with Elizabeth Bartlett on 07/01/22 at  4:00 PM EDT by telephone and verified that I am speaking with the correct person using two identifiers.  I discussed the limitations, risks, security and privacy concerns of performing an evaluation and management service by telemedicine and the availability of in-person appointments. I also discussed with the patient that there may be a patient responsible charge related to this service. The patient expressed understanding and agreed to proceed.  Other persons participating in the visit and their role in the encounter: none.  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  Uterus was ultimately morcellated for removal.   01/31/2013 Pathology Results   Uterus and bilateral fallopian tubes - ENDOMETRIUM: PROLIFERATIVE WITH BENIGN ENDOMETRIAL POLYP. NO HYPERPLASIA OR CARCINOMA. - MYOMETRIUM: ADENOMYOSIS. LEIOMYOMA. - UTERINE SEROSA: UNREMARKABLE. NO ENDOMETRIOSIS OR MALIGNANCY. - BILATERAL FALLOPIAN TUBES: UNREMARKABLE.   12/15/2021 Initial Diagnosis   The patient experienced postmenopausal bleeding.   01/30/2022 Imaging   Pelvic ultrasound exam was performed on 2/10 showing 18 x 18 x 16 cm masslike area within the cervix containing internal blood flow, question cervical fibroids or mass/neoplasm.     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 There is marked thinning of the normal stroma posterolaterally on the right without definite parametrial extension on today's study. No involvement of the rectum. Assessment of the upper posterior bladder wall is limited by surgical scarring and susceptibility artifact from adjacent surgical clips in this region. Within this limitation, no focal bladder wall thickening or abnormal bladder wall enhancement is appreciated. Tumor extends posteriorly to the level of the external os, but no definite vaginal wall involvement appreciable by MRI.   No evidence for pelvic sidewall lymphadenopathy.     03/25/2022 Cancer Staging   Staging form: Corpus Uteri - Carcinoma and Carcinosarcoma, AJCC 8th Edition - Clinical stage from 03/25/2022: Stage II (cT2, cN0, cM0) - Signed by Heath Lark, MD on 03/25/2022 Stage prefix: Initial diagnosis   03/31/2022 Procedure   Successful placement of a right internal jugular approach power injectable Port-A-Cath. The catheter is ready for immediate use.     04/03/2022 - 05/01/2022 Chemotherapy   Patient is on Treatment Plan : Uterine Cisplatin days 1 and days 29 with radiation     06/02/2022 Imaging   1. Mass of the cervical remnant is unchanged in size, but demonstrates new central low attenuation which is likely due to necrosis.  2. No evidence of metastatic disease in the abdomen or pelvis. 3.  Aortic Atherosclerosis (ICD10-I70.0).     Interval History: Doing well. Catheter removed today. Feeling like she emptying.  Bowels movements improving.  Denies pain, just some gas pain.  Denies bleeding or  discharge.   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;  completed radiation both breast 08/ 2016 and completed anastozole 04/ 2021   Depression    Dysuria    Elevated LFTs    FIGO stage II endometrial cancer ([place]) 01/2022   oncologist--- dr gorsuch/ radiation oncology-- dr Sondra Come;   s/p supracervical hysterectomy 02/ 2014 with retained ovaries;  PMB cervical bx endometroid adenocarcinoma ; started chemo 04-03-2022 and completed external beam radiation 05-08-2022 planned brachytherapy high dose radiation on 05-11-2022   History of external beam radiation therapy    completed 08/ 2016 both breast ;    IMRT completed 05-08-2022 for endometrial carinoma/ cervix   History of kidney stones    History of radiation therapy    Cervix-04/06/22-05/12/22- Dr. Gery Pray   Hyperlipidemia    Hypothyroidism    followed by pcp   Migraines    Nocturia more than twice per night    OSA (obstructive sleep apnea)    05-11-2022  per pt last not used in past 6-8 months (approx 10/ 2022) due to nocutria   Wears glasses     Past Surgical History:  Procedure Laterality Date   BILATERAL SALPINGECTOMY Bilateral 01/31/2013   Procedure: BILATERAL SALPINGECTOMY;  Surgeon: Jonnie Kind, MD;  Location: [facility];  Service: Gynecology;  Laterality: Bilateral;   BREAST LUMPECTOMY WITH AXILLARY LYMPH NODE BIOPSY Bilateral 04/15/2015   @ [facility] in [place];   x2 sites on left, x2 site on right,  left axilla node dissection   CESAREAN SECTION  02/19/1987   CHOLECYSTECTOMY OPEN  05/22/1987   CYSTOSCOPY WITH INJECTION N/A 06/24/2022   Procedure: CYSTOSCOPY WITH INJECTION INDOCYANINE GREEN DYE;  Surgeon: Vira Agar, MD;  Location: [facility];  Service:  Urology;  Laterality: N/A;   IR IMAGING GUIDED PORT INSERTION  03/30/2022   LAPAROSCOPIC SUPRACERVICAL HYSTERECTOMY N/A 01/31/2013   Procedure: LAPAROSCOPIC SUPRACERVICAL HYSTERECTOMY;  Surgeon: Jonnie Kind, MD;  Location: [facility];  Service: Gynecology;  Laterality: N/A;   OPERATIVE ULTRASOUND N/A 05/12/2022   Procedure: OPERATIVE ULTRASOUND;  Surgeon: Gery Pray, MD;  Location: [facility];  Service: Urology;  Laterality: N/A;   ROBOTIC ASSISTED TOTAL HYSTERECTOMY N/A 06/24/2022   Procedure: XI ROBOTIC ASSISTED MODIFIED RADICAL TRACHELECTOMY AND CYSTOSCOPY;  Surgeon: Lafonda Mosses, MD;  Location: [facility];  Service: Gynecology;  Laterality: N/A;   TANDEM RING INSERTION N/A 05/12/2022   Procedure: TANDEM RING INSERTION;  Surgeon: Gery Pray, MD;  Location: [facility];  Service: Urology;  Laterality: N/A;   VAGINECTOMY, PARTIAL N/A 06/24/2022   Procedure: UPPER VAGINECTOMY;  Surgeon: Lafonda Mosses, MD;  Location: [facility];  Service: Gynecology;  Laterality: N/A;    Family History  Problem Relation Age of Onset   COPD Mother    Cancer Father        lung   Colon cancer Father    Breast cancer Paternal Aunt        dx early 29s   Cirrhosis Maternal Grandmother        non-alcoholic cirrhosis   Heart attack Maternal Grandfather    Diabetes Maternal Grandfather    Colon cancer Paternal Grandfather  dx 57s   Lymphoma Cousin    Ovarian cancer Neg Hx    Endometrial cancer Neg Hx    Pancreatic cancer Neg Hx    Prostate cancer Neg Hx     Social History   Socioeconomic History   Marital status: Married    Spouse name: Not on file   Number of children: Not on file   Years of education: Not on file   Highest education level: Not on file  Occupational History   Occupation: substitute teacher  Tobacco Use   Smoking status: Never   Smokeless tobacco: Never  Vaping Use   Vaping Use: Never used  Substance and Sexual Activity   Alcohol use:  No   Drug use: Never   Sexual activity: Not Currently    Partners: Male  Other Topics Concern   Not on file  Social History Narrative   Not on file   Social Determinants of Health   Financial Resource Strain: Not on file  Food Insecurity: Not on file  Transportation Needs: Not on file  Physical Activity: Not on file  Stress: Not on file  Social Connections: Not on file    Current Medications:  Current Outpatient Medications:    acetaminophen (TYLENOL) 500 MG tablet, Take 1,000 mg by mouth every 6 (six) hours as needed for moderate pain., Disp: , Rfl:    buPROPion (WELLBUTRIN SR) 150 MG 12 hr tablet, Take 150 mg by mouth 2 (two) times daily., Disp: , Rfl:    famotidine (PEPCID) 20 MG tablet, Take 1 tablet (20 mg total) by mouth 2 (two) times daily. (Patient taking differently: Take 20 mg by mouth 2 (two) times daily as needed for heartburn or indigestion.), Disp: 30 tablet, Rfl: 0   FLUoxetine (PROZAC) 40 MG capsule, Take 40 mg by mouth daily., Disp: , Rfl:    fluticasone (FLONASE) 50 MCG/ACT nasal spray, Place 2 sprays into both nostrils daily as needed for allergies., Disp: , Rfl:    levothyroxine (SYNTHROID, LEVOTHROID) 25 MCG tablet, Take 25 mcg by mouth daily., Disp: , Rfl:    lidocaine-prilocaine (EMLA) cream, Apply 1 Application topically as needed (prior to port access)., Disp: , Rfl:    ondansetron (ZOFRAN) 8 MG tablet, Take 8 mg by mouth every 8 (eight) hours as needed for nausea or vomiting., Disp: , Rfl:    prochlorperazine (COMPAZINE) 10 MG tablet, Take 10 mg by mouth every 6 (six) hours as needed for nausea or vomiting., Disp: , Rfl:    senna-docusate (SENOKOT-S) 8.6-50 MG tablet, Take 2 tablets by mouth at bedtime. For AFTER surgery, do not take if having diarrhea, Disp: 30 tablet, Rfl: 0   traMADol (ULTRAM) 50 MG tablet, Take 1 tablet (50 mg total) by mouth every 6 (six) hours as needed for severe pain. For AFTER surgery only, do not take and drive, Disp: 10 tablet,  Rfl: 0  Review of Symptoms: Pertinent positives as per HPI.  Physical Exam: LMP 01/27/2013  Deferred given limitations of phone visit.  Laboratory & Radiologic Studies: A. CERVIX, RESECTION:  - Invasive well-differentiated adenocarcinoma of the cervix,  HPV-associated, involving both anterior and posterior cervix  - Carcinoma invades for a depth of 0.7 cm  - Radial resection margin is negative for carcinoma (0.6 cm)  - See oncology table   B. OVARIES, BILATERAL, AND LEFT FALLOPIAN TUBE, SALPINGO OOPHORECTOMY:  - Benign unremarkable bilateral ovaries and left fallopian tube   C. VAGINAL MARGIN, ANTERIOR AND POSTERIOR, EXCISION:  - Fragments of  vaginal mucosa showing focal low-grade squamous  intraepithelial lesion (CIN1, low grade dysplasia)  - Negative for carcinoma    COMMENT:   A.  Immunohistochemical stain for p16 is positive in the tumor cells.    ONCOLOGY TABLE:   UTERINE CERVIX, CARCINOMA: Resection   Procedure: Resection, cervix with bilateral oophorectomy and left  salpingectomy  Tumor Size: 1 cm  Histologic Type: Adenocarcinoma, HPV-associated  Histologic Grade: G1: Well-differentiated  Stromal Invasion: Present       Depth of stromal invasion (mm): 7 mm  Other Tissue/ Organ: Not applicable  Margins: All margins negative for invasive carcinoma  Margin Status for HSIL or AIS: All margins negative for HSIL or AIS  Lymphovascular invasion: Not identified  Regional Lymph Nodes: Not applicable (no lymph nodes submitted or found)   Distant Metastasis:       Distant sites involved: Not applicable  Pathologic Stage Classification (pTNM, AJCC 8th Edition): pT1b1, pN not  assigned  Ancillary Studies: Can be performed upon request  Representative Tumor Block: A12  Assessment & Plan: Elizabeth Bartlett is a 61 y.o. 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  Doing  well, meeting postop milestones. Reviewed continued expectations and restrictions.  Discussed pathology. Reviewed diagnosis and findings at surgery. Margins all negative. Will plan to present her case at tumor board but given negative margins, I recommend close surveillance, no adjuvant treatment.  I discussed the assessment and treatment plan with the patient. The patient was provided with an opportunity to ask questions and all were answered. The patient agreed with the plan and demonstrated an understanding of the instructions.   The patient was advised to call back or see an in-person evaluation if the symptoms worsen or if the condition fails to improve as anticipated.   12 minutes of total time was spent for this patient encounter, including preparation, face-to-face counseling with the patient and coordination of care, and documentation of the encounter.   Jeral Pinch, MD  Division of Gynecologic Oncology  Department of Obstetrics and Gynecology  [facility]

## 2022-07-01 NOTE — Progress Notes (Signed)
GYN Oncology Post-operative Follow Up   Elizabeth Bartlett is a 61 year old female who underwent Robotic-assisted lysis of adhesions, right oophorectomy and left salpingo-oophorectomy, modified radical trachelectomy with ureterolysis bilaterally, bladder mobilization, upper vaginectomy and cystoscopy on 06/24/22 with Dr. Jeral Pinch. She presents to the office today with her husband  for foley catheter removal and trial voiding.    She states she has been doing well at home. Tolerating diet with no nausea or emesis.  She states her pain is manageable.  Her bowels are moving without difficulty.  She states she has had adequate urine output in the catheter at home. She denies any vaginal bleeding or discharge.  Denies chest pain and shortness of breath.  No fever.  No lower extremity edema reported.  No concerns voiced.   Exam: Alert, oriented x 3, in no acute distress.    Clear, yellow urine noted in the catheter bag. 150 cc of sterile normal saline instilled in the bladder via the catheter. Patient unable to tolerate instillation of additional fluid. Foley was removed without difficulty. The patient was able to void 100 cc after foley removal. Provided patient with fluids to drink. She voided 20 cc at 12:45 pm and an additional 100 cc at 1:40 pm. Per Dr. Berline Lopes patient does not require catheterization.    Assessment/Plan: 61 year old s/p Robotic-assisted lysis of adhesions, right oophorectomy and left salpingo-oophorectomy, modified radical trachelectomy with ureterolysis bilaterally, bladder mobilization, upper vaginectomy and cystoscopy presenting to the office for foley catheter removal and voiding trial. She was able to void 100 cc without difficulty after instillation of 150 cc of sterile normal saline. Provided patient with fluids to drink. She voided 20 cc at 12:45 pm and an additional 100 cc at 1:40 pm. Reportable signs and symptoms reviewed including signs of urinary retention. She is advised to  attempt to void every 2-3 hours and to record her output. A urine measuring collector "hat" given to the patient. No concerns voiced at the visit.  Patient to have phone visit with Dr. Berline Lopes this afternoon.    This appointment is included in the global surgical bundle and has no charge.

## 2022-07-01 NOTE — Patient Instructions (Signed)
It was good to see you today. Today we removed your urinary catheter. It is important to try to void every 2-3 hours and track your urinary output.   If you have difficulty voiding, are not able to empty your bladder or have pain, odor, fever or chills please notify our office at [phone].   If the office is closed please call the after-hours number [phone] and ask to speak with the gyn oncologist on call.   Please do not hesitate to contact us with any questions or concerns.

## 2022-07-02 ENCOUNTER — Telehealth: Payer: Self-pay | Admitting: *Deleted

## 2022-07-02 ENCOUNTER — Other Ambulatory Visit: Payer: Self-pay | Admitting: Hematology and Oncology

## 2022-07-02 DIAGNOSIS — C50811 Malignant neoplasm of overlapping sites of right female breast: Secondary | ICD-10-CM

## 2022-07-02 NOTE — Progress Notes (Signed)
Reviewed with Dr. Berline Lopes, no further Rx required, will order port removal

## 2022-07-02 NOTE — Telephone Encounter (Signed)
Per Dr.Gorsuch, called pt with message below. Pt stated port was removed on yesterday. Advised pt to call office with any questions or concerns, pt verbalized understanding.

## 2022-07-02 NOTE — Telephone Encounter (Signed)
-----   Message from Heath Lark, MD sent at 07/02/2022  8:20 AM EDT ----- Pls call and let her know No further chemo is required I have ordered port removal

## 2022-07-09 ENCOUNTER — Other Ambulatory Visit: Payer: Self-pay | Admitting: Student

## 2022-07-09 NOTE — H&P (Signed)
Chief Complaint: Patient was seen in consultation today for tunneled catheter with port removal Referring Physician(s): [place]  Supervising Physician: Arne Cleveland  Patient Status: [facility] - Out-pt  History of Present Illness: Elizabeth Bartlett is a 61 y.o. female with past medical history of anxiety, bilateral malignant neoplasm of breast, depression, elevated LFTs, endometrial cancer stage II, chemoradiation therapy, HLD, migraines and OSA.  Patient was diagnosed with uterine cancer 01/20/2013.  She has undergone surgeries in as well as chemoradiation over the past 9 years.  Patient underwent robotic assisted modified radical trachelectomy and cystoscopy 06/24/2022.  Patient has cervical mass that is unchanged in size with negative margins and no evidence of metastatic disease in the abdomen or pelvis.  Dr. Heath Lark has referred patient to IR for tunneled catheter with port removal since no further treatment is required.  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;  completed radiation both breast 08/ 2016 and completed anastozole 04/ 2021   Depression    Dysuria    Elevated LFTs    FIGO stage II endometrial cancer ([place]) 01/2022   oncologist--- dr gorsuch/ radiation oncology-- dr Sondra Come;   s/p supracervical hysterectomy 02/ 2014 with retained ovaries;  PMB cervical bx endometroid adenocarcinoma ; started chemo 04-03-2022 and completed external beam radiation 05-08-2022 planned brachytherapy high dose radiation on 05-11-2022   History of external beam radiation therapy    completed 08/ 2016 both breast ;    IMRT completed 05-08-2022 for endometrial carinoma/ cervix   History of kidney stones    History of radiation therapy     Cervix-04/06/22-05/12/22- Dr. Gery Pray   Hyperlipidemia    Hypothyroidism    followed by pcp   Migraines    Nocturia more than twice per night    OSA (obstructive sleep apnea)    05-11-2022  per pt last not used in past 6-8 months (approx 10/ 2022) due to nocutria   Wears glasses     Past Surgical History:  Procedure Laterality Date   BILATERAL SALPINGECTOMY Bilateral 01/31/2013   Procedure: BILATERAL SALPINGECTOMY;  Surgeon: Jonnie Kind, MD;  Location: [facility];  Service: Gynecology;  Laterality: Bilateral;   BREAST LUMPECTOMY WITH AXILLARY LYMPH NODE BIOPSY Bilateral 04/15/2015   @ [facility] in [place];   x2 sites on left, x2 site on right,  left axilla node dissection   CESAREAN SECTION  02/19/1987   CHOLECYSTECTOMY OPEN  05/22/1987   CYSTOSCOPY WITH INJECTION N/A 06/24/2022   Procedure: CYSTOSCOPY WITH INJECTION INDOCYANINE GREEN DYE;  Surgeon: Vira Agar, MD;  Location: [facility];  Service: Urology;  Laterality: N/A;   IR IMAGING GUIDED PORT INSERTION  03/30/2022   LAPAROSCOPIC SUPRACERVICAL HYSTERECTOMY N/A 01/31/2013   Procedure: LAPAROSCOPIC SUPRACERVICAL HYSTERECTOMY;  Surgeon: Jonnie Kind, MD;  Location: [facility];  Service: Gynecology;  Laterality: N/A;   OPERATIVE ULTRASOUND N/A 05/12/2022   Procedure: OPERATIVE ULTRASOUND;  Surgeon: Gery Pray, MD;  Location: [facility];  Service: Urology;  Laterality: N/A;   ROBOTIC ASSISTED TOTAL HYSTERECTOMY N/A 06/24/2022   Procedure: XI ROBOTIC ASSISTED MODIFIED RADICAL TRACHELECTOMY AND CYSTOSCOPY;  Surgeon: Lafonda Mosses, MD;  Location: [facility];  Service: Gynecology;  Laterality: N/A;  TANDEM RING INSERTION N/A 05/12/2022   Procedure: TANDEM RING INSERTION;  Surgeon: Gery Pray, MD;  Location: [facility];  Service: Urology;  Laterality: N/A;   VAGINECTOMY, PARTIAL N/A 06/24/2022   Procedure: UPPER VAGINECTOMY;  Surgeon: Lafonda Mosses, MD;  Location: [facility];  Service: Gynecology;   Laterality: N/A;    Allergies: Patient has no known allergies.  Medications: Prior to Admission medications   Medication Sig Start Date End Date Taking? Authorizing Provider  acetaminophen (TYLENOL) 500 MG tablet Take 1,000 mg by mouth every 6 (six) hours as needed for moderate pain.    [provider]  buPROPion (WELLBUTRIN SR) 150 MG 12 hr tablet Take 150 mg by mouth 2 (two) times daily. 01/27/22   [provider]  famotidine (PEPCID) 20 MG tablet Take 1 tablet (20 mg total) by mouth 2 (two) times daily. Patient taking differently: Take 20 mg by mouth 2 (two) times daily as needed for heartburn or indigestion. 04/04/22   Tedd Sias, PA  FLUoxetine (PROZAC) 40 MG capsule Take 40 mg by mouth daily.    [provider]  fluticasone (FLONASE) 50 MCG/ACT nasal spray Place 2 sprays into both nostrils daily as needed for allergies.    [provider]  levothyroxine (SYNTHROID, LEVOTHROID) 25 MCG tablet Take 25 mcg by mouth daily.    [provider]  lidocaine-prilocaine (EMLA) cream Apply 1 Application topically as needed (prior to port access).    [provider]  ondansetron (ZOFRAN) 8 MG tablet Take 8 mg by mouth every 8 (eight) hours as needed for nausea or vomiting.    [provider]  prochlorperazine (COMPAZINE) 10 MG tablet Take 10 mg by mouth every 6 (six) hours as needed for nausea or vomiting.    [provider]  senna-docusate (SENOKOT-S) 8.6-50 MG tablet Take 2 tablets by mouth at bedtime. For AFTER surgery, do not take if having diarrhea 06/01/22   Joylene John D, NP  traMADol (ULTRAM) 50 MG tablet Take 1 tablet (50 mg total) by mouth every 6 (six) hours as needed for severe pain. For AFTER surgery only, do not take and drive 1/70/01   Joylene John D, NP     Family History  Problem Relation Age of Onset   COPD Mother    Cancer Father        lung   Colon cancer Father    Breast cancer Paternal Aunt         dx early 59s   Cirrhosis Maternal Grandmother        non-alcoholic cirrhosis   Heart attack Maternal Grandfather    Diabetes Maternal Grandfather    Colon cancer Paternal Grandfather        dx 29s   Lymphoma Cousin    Ovarian cancer Neg Hx    Endometrial cancer Neg Hx    Pancreatic cancer Neg Hx    Prostate cancer Neg Hx     Social History   Socioeconomic History   Marital status: Married    Spouse name: Not on file   Number of children: Not on file   Years of education: Not on file   Highest education level: Not on file  Occupational History   Occupation: substitute teacher  Tobacco Use   Smoking status: Never   Smokeless tobacco: Never  Vaping Use   Vaping Use: Never used  Substance and Sexual Activity   Alcohol use: No   Drug use: Never   Sexual activity:  Not Currently    Partners: Male  Other Topics Concern   Not on file  Social History Narrative   Not on file   Social Determinants of Health   Financial Resource Strain: Not on file  Food Insecurity: Not on file  Transportation Needs: Not on file  Physical Activity: Not on file  Stress: Not on file  Social Connections: Not on file    Review of Systems: A 12 point ROS discussed and pertinent positives are indicated in the HPI above.  All other systems are negative.  Review of Systems  All other systems reviewed and are negative.   Vital Signs: BP (!) 157/73 (BP Location: Right Arm)   Pulse 87   Temp 98 F (36.7 C) (Oral)   Resp 15   LMP 01/27/2013   SpO2 96%     Physical Exam Vitals reviewed.  Constitutional:      General: She is not in acute distress.    Appearance: She is not ill-appearing.  HENT:     Head: Normocephalic and atraumatic.     Mouth/Throat:     Mouth: Mucous membranes are dry.     Pharynx: Oropharynx is clear.  Eyes:     Extraocular Movements: Extraocular movements intact.     Pupils: Pupils are equal, round, and reactive to light.  Cardiovascular:     Rate and  Rhythm: Normal rate and regular rhythm.     Pulses: Normal pulses.     Heart sounds: Normal heart sounds.  Pulmonary:     Effort: Pulmonary effort is normal. No respiratory distress.     Breath sounds: Normal breath sounds.  Abdominal:     General: Bowel sounds are normal. There is no distension.     Palpations: Abdomen is soft.     Tenderness: There is no abdominal tenderness. There is no guarding.  Musculoskeletal:     Right lower leg: No edema.     Left lower leg: No edema.  Skin:    General: Skin is warm and dry.  Neurological:     Mental Status: She is alert and oriented to person, place, and time.  Psychiatric:        Mood and Affect: Mood normal.        Behavior: Behavior normal.        Thought Content: Thought content normal.        Judgment: Judgment normal.     Imaging: No results found.  Labs:  CBC: Recent Labs    04/27/22 0933 05/04/22 0933 06/15/22 1041 06/25/22 0452  WBC 4.8 7.4 6.2 12.9*  HGB 11.7* 12.0 12.4 11.1*  HCT 35.5* 35.9* 38.2 34.0*  PLT 184 272 320 260    COAGS: No results for input(s): "INR", "APTT" in the last 8760 hours.  BMP: Recent Labs    05/04/22 0933 06/01/22 0933 06/15/22 1041 06/25/22 0452  NA 139 140 139 141  K 3.7 4.0 4.0 3.7  CL 100 103 107 109  CO2 33* '30 24 24  '$ GLUCOSE 99 100* 93 144*  BUN '11 12 14 8  '$ CALCIUM 8.8* 9.3 9.0 8.5*  CREATININE 0.75 0.76 0.80 0.85  GFRNONAA >60 >60 >60 >60    LIVER FUNCTION TESTS: Recent Labs    04/27/22 0933 05/04/22 0933 06/01/22 0933 06/15/22 1041  BILITOT 0.6 0.5 0.4 0.7  AST 46* '16 28 28  '$ ALT 84* 50* 46* 32  ALKPHOS 170* 134* 185* 153*  PROT 6.5 6.3* 6.5 6.8  ALBUMIN 3.9 3.8 4.0  3.9    TUMOR MARKERS: No results for input(s): "AFPTM", "CEA", "CA199", "CHROMGRNA" in the last 8760 hours.  Assessment and Plan: History of anxiety, bilateral malignant neoplasm of breast, depression, elevated LFTs, endometrial cancer stage II, chemoradiation therapy, HLD, migraines and  OSA.  Patient was diagnosed with uterine cancer 01/20/2013.  She has undergone surgeries in as well as chemoradiation over the past 9 years.  Patient underwent robotic assisted modified radical trachelectomy and cystoscopy 06/24/2022.  Patient has cervical mass that is unchanged in size with negative margins and no evidence of metastatic disease in the abdomen or pelvis.  Dr. Heath Lark has referred patient to IR for tunneled catheter with port removal since no further treatment is required.  Pt resting on stretcher.  She is A&O, calm and pleasant.  She is in no distress.  Pt states she is NPO per order.   Risks and benefits of tunneled catheter with port removal was discussed with the patient including, but not limited to bleeding, infection, pneumothorax and need for additional procedures.  All of the patient's questions were answered, patient is agreeable to proceed. Consent signed and in chart.    Thank you for this interesting consult.  I greatly enjoyed meeting Cyndal Kasson Petraglia and look forward to participating in their care.  A copy of this report was sent to the requesting provider on this date.  Electronically Signed: Tyson Alias, NP 07/10/2022, 2:38 PM   I spent a total of 20 minutes in face to face in clinical consultation, greater than 50% of which was counseling/coordinating care for tunneled catheter with port placement.

## 2022-07-10 ENCOUNTER — Ambulatory Visit (HOSPITAL_COMMUNITY)
Admission: RE | Admit: 2022-07-10 | Discharge: 2022-07-10 | Disposition: A | Discharge status: Home / Self Care | Payer: BC Managed Care – PPO | Source: Ambulatory Visit | Attending: Hematology and Oncology | Admitting: Hematology and Oncology

## 2022-07-10 ENCOUNTER — Encounter (HOSPITAL_COMMUNITY): Payer: Self-pay

## 2022-07-10 ENCOUNTER — Other Ambulatory Visit: Payer: Self-pay

## 2022-07-10 DIAGNOSIS — C50812 Malignant neoplasm of overlapping sites of left female breast: Secondary | ICD-10-CM | POA: Insufficient documentation

## 2022-07-10 DIAGNOSIS — E785 Hyperlipidemia, unspecified: Secondary | ICD-10-CM | POA: Diagnosis not present

## 2022-07-10 DIAGNOSIS — G4733 Obstructive sleep apnea (adult) (pediatric): Secondary | ICD-10-CM | POA: Diagnosis not present

## 2022-07-10 DIAGNOSIS — C50811 Malignant neoplasm of overlapping sites of right female breast: Secondary | ICD-10-CM | POA: Insufficient documentation

## 2022-07-10 DIAGNOSIS — Z8541 Personal history of malignant neoplasm of cervix uteri: Secondary | ICD-10-CM | POA: Diagnosis not present

## 2022-07-10 DIAGNOSIS — Z452 Encounter for adjustment and management of vascular access device: Secondary | ICD-10-CM | POA: Insufficient documentation

## 2022-07-10 DIAGNOSIS — F32A Depression, unspecified: Secondary | ICD-10-CM | POA: Insufficient documentation

## 2022-07-10 DIAGNOSIS — F419 Anxiety disorder, unspecified: Secondary | ICD-10-CM | POA: Diagnosis not present

## 2022-07-10 HISTORY — PX: IR REMOVAL TUN ACCESS W/ PORT W/O FL MOD SED: IMG2290

## 2022-07-10 MED ORDER — MIDAZOLAM HCL 2 MG/2ML IJ SOLN
INTRAMUSCULAR | Status: AC
  Filled 2022-07-10: qty 6

## 2022-07-10 MED ORDER — MIDAZOLAM HCL 2 MG/2ML IJ SOLN
INTRAMUSCULAR | Status: AC | PRN
  Administered 2022-07-10: 2 mg via INTRAVENOUS

## 2022-07-10 MED ORDER — FENTANYL CITRATE (PF) 100 MCG/2ML IJ SOLN
INTRAMUSCULAR | Status: AC | PRN
  Administered 2022-07-10: 50 ug via INTRAVENOUS

## 2022-07-10 MED ORDER — LIDOCAINE-EPINEPHRINE 1 %-1:100000 IJ SOLN
INTRAMUSCULAR | Status: AC | PRN
  Administered 2022-07-10: 10 mL via INTRADERMAL

## 2022-07-10 MED ORDER — FENTANYL CITRATE (PF) 100 MCG/2ML IJ SOLN
INTRAMUSCULAR | Status: AC
  Filled 2022-07-10: qty 4

## 2022-07-10 MED ORDER — SODIUM CHLORIDE 0.9 % IV SOLN: INTRAVENOUS | Status: DC

## 2022-07-10 MED ORDER — LIDOCAINE-EPINEPHRINE 1 %-1:100000 IJ SOLN
INTRAMUSCULAR | Status: AC
  Filled 2022-07-10: qty 1

## 2022-07-10 NOTE — Discharge Instructions (Signed)
Please call Interventional Radiology clinic [phone] with any questions or concerns.  You may remove your dressing and shower tomorrow.   Implanted Port Removal, Care After This sheet gives you information about how to care for yourself after your procedure. Your health care provider may also give you more specific instructions. If you have problems or questions, contact your health careprovider. What can I expect after the procedure? After the procedure, it is common to have: Soreness or pain near your incision. Some swelling or bruising near your incision. Follow these instructions at home: Medicines Take over-the-counter and prescription medicines only as told by your health care provider. If you were prescribed an antibiotic medicine, take it as told by your health care provider. Do not stop taking the antibiotic even if you start to feel better. Bathing Do not take baths, swim, or use a hot tub until your health care provider approves. Ask your health care provider if you can take showers. You may only be allowed to take sponge baths. Incision care Follow instructions from your health care provider about how to take care of your incision. Make sure you: Wash your hands with soap and water before you change your bandage (dressing). If soap and water are not available, use hand sanitizer. Change your dressing as told by your health care provider. Keep your dressing dry. Leave  skin glue, or adhesive strips in place. These skin closures may need to stay in place for 2 weeks or longer. If adhesive strip edges start to loosen and curl up, you may trim the loose edges.  Check your incision area every day for signs of infection. Check for:       - More redness, swelling, or pain.       - More fluid or blood.       - Warmth.       - Pus or a bad smell.  Driving Do not drive for 24 hours if you were given a medicine to help you relax (sedative) during your procedure. If you did not  receive a sedative, ask your health care provider when it is safe to drive.  Activity Return to your normal activities as told by your health care provider. Ask your health care provider what activities are safe for you. Do not lift anything that is heavier than 10 lb (4.5 kg), or the limit that you are told, until your health care provider says that it is safe. Do not do activities that involve lifting your arms over your head. General instructions Do not use any products that contain nicotine or tobacco, such as cigarettes and e-cigarettes. These can delay healing. If you need help quitting, ask your health care provider. Keep all follow-up visits as told by your health care provider. This is important. Contact a health care provider if: You have more redness, swelling, or pain around your incision. You have more fluid or blood coming from your incision. Your incision feels warm to the touch. You have pus or a bad smell coming from your incision. You have pain that is not relieved by your pain medicine. Get help right away if you have: A fever or chills. Chest pain. Difficulty breathing. Summary After the procedure, it is common to have pain, soreness, swelling, or bruising near your incision. If you were prescribed an antibiotic medicine, take it as told by your health care provider. Do not stop taking the antibiotic even if you start to feel better. Do not drive for 24 hours  if you were given a sedative during your procedure. Return to your normal activities as told by your health care provider. Ask your health care provider what activities are safe for you. This information is not intended to replace advice given to you by your health care provider. Make sure you discuss any questions you have with your healthcare provider. Document Revised: 01/20/2018 Document Reviewed: 01/20/2018 Elsevier Patient Education  2022 [employer].

## 2022-07-10 NOTE — Procedures (Signed)
  Procedure:  R chest port removal   Preprocedure diagnosis: The encounter diagnosis was Bilateral malignant neoplasm of overlapping sites of breast in female, unspecified estrogen receptor status ([place]).  Postprocedure diagnosis: same EBL:    minimal Complications:   none immediate  See full dictation in [employer].  Dillard Cannon MD Main # [phone] Pager  [phone] Mobile [phone]

## 2022-07-13 ENCOUNTER — Other Ambulatory Visit: Payer: Self-pay

## 2022-07-13 ENCOUNTER — Other Ambulatory Visit: Payer: Self-pay | Admitting: *Deleted

## 2022-07-13 ENCOUNTER — Other Ambulatory Visit: Payer: Self-pay | Admitting: Gynecologic Oncology

## 2022-07-13 NOTE — Progress Notes (Signed)
The proposed treatment discussed in conference is for discussion purpose only and is not a binding recommendation.  The patients have not been physically examined, or presented with their treatment options.  Therefore, final treatment plans cannot be decided.  

## 2022-07-17 ENCOUNTER — Encounter: Payer: Self-pay | Admitting: Gynecologic Oncology

## 2022-07-19 NOTE — Progress Notes (Unsigned)
Gynecologic Oncology Return Clinic Visit  07/20/22  Reason for Visit: follow-up after surgery, treatment discussion  Treatment History: Oncology History Overview Note  Initial biopsy - cervical biopsy, ECC ER 95%, PR 95%, MMR abnormal Negative genetics  PDL1: CPS 80%   Uterine cancer ([place])  01/20/2013 Pathology Results   Endometrium, biopsy - POLYPOID DEGENERATING SECRETORY-TYPE ENDOMETRIUM, SEE COMMENT   01/31/2013 Surgery   She underwent supracervical hysterectomy and bilateral salpingo-oophorectomy.  Uterus was ultimately morcellated for removal.   01/31/2013 Pathology Results   Uterus and bilateral fallopian tubes - ENDOMETRIUM: PROLIFERATIVE WITH BENIGN ENDOMETRIAL POLYP. NO HYPERPLASIA OR CARCINOMA. - MYOMETRIUM: ADENOMYOSIS. LEIOMYOMA. - UTERINE SEROSA: UNREMARKABLE. NO ENDOMETRIOSIS OR MALIGNANCY. - BILATERAL FALLOPIAN TUBES: UNREMARKABLE.   12/15/2021 Initial Diagnosis   The patient experienced postmenopausal bleeding.   01/30/2022 Imaging   Pelvic ultrasound exam was performed on 2/10 showing 18 x 18 x 16 cm masslike area within the cervix containing internal blood flow, question cervical fibroids or mass/neoplasm.     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 There is marked thinning of the normal stroma posterolaterally on the right without definite parametrial extension on today's study. No involvement of the rectum. Assessment of the upper posterior bladder wall is limited by surgical scarring  and susceptibility artifact from adjacent surgical clips in this region. Within this limitation, no focal bladder wall thickening or abnormal bladder wall enhancement is appreciated. Tumor extends posteriorly to the level of the external os, but no definite vaginal wall involvement appreciable by MRI.   No evidence for pelvic sidewall lymphadenopathy.     03/25/2022 Cancer Staging   Staging form: Corpus Uteri - Carcinoma and Carcinosarcoma, AJCC 8th Edition - Clinical stage from 03/25/2022: Stage II (cT2, cN0, cM0) - Signed by Heath Lark, MD on 03/25/2022 Stage prefix: Initial diagnosis   03/31/2022 Procedure   Successful placement of a right internal jugular approach power injectable Port-A-Cath. The catheter is ready for immediate use.     04/03/2022 - 05/01/2022 Chemotherapy   Patient is on Treatment Plan : Uterine Cisplatin days 1 and days 29 with radiation     06/02/2022 Imaging   1. Mass of the cervical remnant is unchanged in size, but demonstrates new central low attenuation which is likely due to necrosis.  2. No evidence of metastatic disease in the abdomen or pelvis. 3.  Aortic Atherosclerosis (ICD10-I70.0).   06/24/2022 Pathology Results   A. CERVIX, RESECTION:  - Invasive well-differentiated adenocarcinoma of the cervix, HPV-associated, involving both anterior and posterior cervix  - Carcinoma invades for a depth of 0.7 cm  - Radial resection margin is negative for carcinoma (0.6 cm)  - See oncology table   B. OVARIES, BILATERAL, AND LEFT FALLOPIAN TUBE, SALPINGO OOPHORECTOMY:  - Benign unremarkable bilateral ovaries and left fallopian tube   C. VAGINAL MARGIN, ANTERIOR AND POSTERIOR, EXCISION:  - Fragments of vaginal mucosa showing focal low-grade squamous intraepithelial lesion (CIN1, low grade dysplasia)  - Negative for carcinoma    07/13/2022 Procedure   Technically successful tunneled Port catheter removal.     Interval History: Doing  well.  Denies any abdominal or  pelvic pain.  Denies any vaginal bleeding or discharge.  Reports regular bowel function.  Has noticed several weeks of urinary urgency.  If she does not get to the bathroom right away when she feels the need to urinate, she will sometimes leak a little bit right before she gets to the bathroom.  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 Gery Pray   Hyperlipidemia    Hypothyroidism    followed by pcp   Migraines    Nocturia more than twice per night    OSA (obstructive sleep apnea)    05-11-2022  per pt last not used in past 6-8 months (approx 10/ 2022) due to nocutria   Wears glasses     Past Surgical History:  Procedure Laterality Date   BILATERAL SALPINGECTOMY Bilateral 01/31/2013   Procedure: BILATERAL SALPINGECTOMY;  Surgeon: Jonnie Kind, MD;  Location: [facility];  Service: Gynecology;  Laterality: Bilateral;   BREAST LUMPECTOMY WITH AXILLARY LYMPH NODE BIOPSY Bilateral 04/15/2015   @ [facility] in [place];    x2 sites on left, x2 site on right,  left axilla node dissection   CESAREAN SECTION  02/19/1987   CHOLECYSTECTOMY OPEN  05/22/1987   CYSTOSCOPY WITH INJECTION N/A 06/24/2022   Procedure: CYSTOSCOPY WITH INJECTION INDOCYANINE GREEN DYE;  Surgeon: Vira Agar, MD;  Location: [facility];  Service: Urology;  Laterality: N/A;   IR IMAGING GUIDED PORT INSERTION  03/30/2022   IR REMOVAL TUN ACCESS W/ PORT W/O FL MOD SED  07/10/2022   LAPAROSCOPIC SUPRACERVICAL HYSTERECTOMY N/A 01/31/2013   Procedure: LAPAROSCOPIC SUPRACERVICAL HYSTERECTOMY;  Surgeon: Jonnie Kind, MD;  Location: [facility];  Service: Gynecology;  Laterality: N/A;   OPERATIVE ULTRASOUND N/A 05/12/2022   Procedure: OPERATIVE ULTRASOUND;  Surgeon: Gery Pray, MD;  Location: [facility];  Service: Urology;  Laterality: N/A;   ROBOTIC ASSISTED TOTAL HYSTERECTOMY N/A 06/24/2022   Procedure: XI ROBOTIC ASSISTED MODIFIED RADICAL TRACHELECTOMY AND CYSTOSCOPY;  Surgeon: Lafonda Mosses, MD;  Location: [facility];  Service: Gynecology;  Laterality: N/A;   TANDEM RING INSERTION N/A 05/12/2022   Procedure: TANDEM RING INSERTION;  Surgeon: Gery Pray, MD;  Location: [facility];  Service: Urology;  Laterality: N/A;   VAGINECTOMY, PARTIAL N/A 06/24/2022   Procedure: UPPER VAGINECTOMY;  Surgeon: Lafonda Mosses, MD;  Location: [facility];  Service: Gynecology;  Laterality: N/A;    Family History  Problem Relation Age of Onset  COPD Mother    Cancer Father        lung   Colon cancer Father    Breast cancer Paternal Aunt        dx early 102s   Cirrhosis Maternal Grandmother        non-alcoholic cirrhosis   Heart attack Maternal Grandfather    Diabetes Maternal Grandfather    Colon cancer Paternal Grandfather        dx 69s   Lymphoma Cousin    Ovarian cancer Neg Hx    Endometrial cancer Neg Hx    Pancreatic cancer Neg Hx    Prostate cancer Neg Hx     Social History   Socioeconomic History   Marital  status: Married    Spouse name: Not on file   Number of children: Not on file   Years of education: Not on file   Highest education level: Not on file  Occupational History   Occupation: substitute teacher  Tobacco Use   Smoking status: Never   Smokeless tobacco: Never  Vaping Use   Vaping Use: Never used  Substance and Sexual Activity   Alcohol use: No   Drug use: Never   Sexual activity: Not Currently    Partners: Male  Other Topics Concern   Not on file  Social History Narrative   Not on file   Social Determinants of Health   Financial Resource Strain: Not on file  Food Insecurity: Not on file  Transportation Needs: Not on file  Physical Activity: Not on file  Stress: Not on file  Social Connections: Not on file    Current Medications:  Current Outpatient Medications:    acetaminophen (TYLENOL) 500 MG tablet, Take 1,000 mg by mouth every 6 (six) hours as needed for moderate pain., Disp: , Rfl:    buPROPion (WELLBUTRIN SR) 150 MG 12 hr tablet, Take 150 mg by mouth 2 (two) times daily., Disp: , Rfl:    famotidine (PEPCID) 20 MG tablet, Take 1 tablet (20 mg total) by mouth 2 (two) times daily. (Patient taking differently: Take 20 mg by mouth 2 (two) times daily as needed for heartburn or indigestion.), Disp: 30 tablet, Rfl: 0   FLUoxetine (PROZAC) 40 MG capsule, Take 40 mg by mouth daily., Disp: , Rfl:    fluticasone (FLONASE) 50 MCG/ACT nasal spray, Place 2 sprays into both nostrils daily as needed for allergies., Disp: , Rfl:    levothyroxine (SYNTHROID, LEVOTHROID) 25 MCG tablet, Take 25 mcg by mouth daily., Disp: , Rfl:   Review of Systems: + Urinary frequency Denies appetite changes, fevers, chills, fatigue, unexplained weight changes. Denies hearing loss, neck lumps or masses, mouth sores, ringing in ears or voice changes. Denies cough or wheezing.  Denies shortness of breath. Denies chest pain or palpitations. Denies leg swelling. Denies abdominal distention,  pain, blood in stools, constipation, diarrhea, nausea, vomiting, or early satiety. Denies pain with intercourse, dysuria, hematuria or incontinence. Denies hot flashes, pelvic pain, vaginal bleeding or vaginal discharge.   Denies joint pain, back pain or muscle pain/cramps. Denies itching, rash, or wounds. Denies dizziness, headaches, numbness or seizures. Denies swollen lymph nodes or glands, denies easy bruising or bleeding. Denies anxiety, depression, confusion, or decreased concentration.  Physical Exam: BP 139/72 (BP Location: Right Arm)   Pulse 74   Temp 98.2 F (36.8 C) (Oral)   Resp 18   Wt 159 lb 6 oz (72.3 kg)   LMP 01/27/2013   SpO2 98%   BMI 31.13  kg/m  General: Alert, oriented, no acute distress. HEENT: Normocephalic, atraumatic, sclera anicteric. Chest: Unlabored breathing on room air. Abdomen: Obese, soft, nontender.  Normoactive bowel sounds.  No masses or hepatosplenomegaly appreciated.  Well-healed incisions. Extremities: Grossly normal range of motion.  Warm, well perfused.  No edema bilaterally. Skin: No rashes or lesions noted. GU: Normal appearing external genitalia without erythema, excoriation, or lesions.  Speculum exam reveals cuff is intact, no bleeding or discharge, suture visible.  Bimanual exam reveals cuff intact, no fluctuance or tenderness with palpation.    Laboratory & Radiologic Studies: None new  Assessment & Plan: Elizabeth Bartlett is a 61 y.o. 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   Doing well, meeting postop milestones. Reviewed continued expectations and restrictions.  Port was removed on 7/21.    Discussed pathology again. Reviewed diagnosis and findings at surgery.  Despite what appeared to be cancer arising from the lower uterine segment on initial biopsy, final pathology reveals HPV dependent process favoring cervical cancer.  Given  negative margins with good radiation response noted on interval surgery, plan for close surveillance.  Per NCCN surveillance recommendation, surveillance will happen every 3 months for 2-3 years.  We will alternate these between radiation oncology and my clinic.  First visit will be in 3 months with me.  Will plan on a yearly pap test. We discussed signs and symptoms that would be concerning for cancer recurrence and should prompt a phone call before her next scheduled visit.   22 minutes of total time was spent for this patient encounter, including preparation, face-to-face counseling with the patient and coordination of care, and documentation of the encounter.  Jeral Pinch, MD  Division of Gynecologic Oncology  Department of Obstetrics and Gynecology  [facility]

## 2022-07-19 NOTE — Patient Instructions (Signed)
It was good to see you today. You are healing well from surgery! Remember, no heavy lifting for 6 weeks and nothing in the vagina for at least 8-10 weeks.  I will see you in 3 months for follow-up visit. Please call if you develop any new and concerning symptoms before then.

## 2022-07-20 ENCOUNTER — Inpatient Hospital Stay (HOSPITAL_BASED_OUTPATIENT_CLINIC_OR_DEPARTMENT_OTHER): Payer: BC Managed Care – PPO | Admitting: Gynecologic Oncology

## 2022-07-20 ENCOUNTER — Inpatient Hospital Stay: Payer: BC Managed Care – PPO

## 2022-07-20 ENCOUNTER — Other Ambulatory Visit: Payer: Self-pay

## 2022-07-20 ENCOUNTER — Encounter: Payer: Self-pay | Admitting: Gynecologic Oncology

## 2022-07-20 ENCOUNTER — Other Ambulatory Visit: Payer: Self-pay | Admitting: *Deleted

## 2022-07-20 VITALS — BP 139/72 | HR 74 | Temp 98.2°F | Resp 18 | Wt 159.4 lb

## 2022-07-20 DIAGNOSIS — Z9221 Personal history of antineoplastic chemotherapy: Secondary | ICD-10-CM | POA: Diagnosis not present

## 2022-07-20 DIAGNOSIS — R350 Frequency of micturition: Secondary | ICD-10-CM | POA: Diagnosis present

## 2022-07-20 DIAGNOSIS — R399 Unspecified symptoms and signs involving the genitourinary system: Secondary | ICD-10-CM

## 2022-07-20 DIAGNOSIS — C541 Malignant neoplasm of endometrium: Secondary | ICD-10-CM | POA: Diagnosis not present

## 2022-07-20 DIAGNOSIS — Z923 Personal history of irradiation: Secondary | ICD-10-CM | POA: Diagnosis not present

## 2022-07-20 DIAGNOSIS — Z853 Personal history of malignant neoplasm of breast: Secondary | ICD-10-CM | POA: Diagnosis not present

## 2022-07-20 DIAGNOSIS — C539 Malignant neoplasm of cervix uteri, unspecified: Secondary | ICD-10-CM

## 2022-07-20 DIAGNOSIS — Z7189 Other specified counseling: Secondary | ICD-10-CM

## 2022-07-21 ENCOUNTER — Telehealth: Payer: Self-pay

## 2022-07-21 LAB — URINE CULTURE: Culture: NO GROWTH

## 2022-07-21 NOTE — Telephone Encounter (Signed)
Pt is aware of negative urine culture from yesterday

## 2022-08-25 ENCOUNTER — Telehealth: Payer: Self-pay

## 2022-08-25 NOTE — Telephone Encounter (Signed)
Pt LVM on office triage line last night 08/24/22.   I spoke to pt this morning, she states she had surgery exactly 2 months today. Late yesterday afternoon, she noticed light red blood on toilet tissue and panty liner.No gushing of blood. It was a one time thing. No pain/cramping. No clots. She was doing extra cleaning, but states she wasn't doing anything unusual prior to the spotting. No heavy lifting, exercise, intercourse, nothing was inserted in the vagina. No fever or chills. She has not noticed anything yet this morning. Her family was telling her to let Dr.Tucker know just to be sure.   Pt has a follow up scheduled with Dr.Tucker on 10/13/22.

## 2022-08-26 NOTE — Telephone Encounter (Signed)
Elizabeth Bartlett stated that she has not had any further vaginal spotting. Told her that there can be some vaginal spotting when the sutures dissolve in the vaginal cuff ~6-8 weeks after surgery. Told her to call if she has any bleeding like a period. Elizabeth Schumacher verbalized understanding.

## 2022-08-26 NOTE — Telephone Encounter (Signed)
Thanks for calling, please let her know that if she has more than spotting here and there to please call the clinic

## 2022-08-26 NOTE — Telephone Encounter (Signed)
Told Elizabeth Bartlett to call if there is more than spotting as noted below by Dr. Berline Lopes. Pt verbalized understanding.

## 2022-10-12 ENCOUNTER — Encounter: Payer: Self-pay | Admitting: Gynecologic Oncology

## 2022-10-13 ENCOUNTER — Inpatient Hospital Stay: Payer: BC Managed Care – PPO | Attending: Gynecologic Oncology | Admitting: Gynecologic Oncology

## 2022-10-13 ENCOUNTER — Encounter: Payer: Self-pay | Admitting: Gynecologic Oncology

## 2022-10-13 VITALS — BP 147/76 | HR 98 | Temp 98.6°F | Resp 17 | Ht 60.0 in | Wt 158.5 lb

## 2022-10-13 DIAGNOSIS — Z90710 Acquired absence of both cervix and uterus: Secondary | ICD-10-CM | POA: Insufficient documentation

## 2022-10-13 DIAGNOSIS — Z8541 Personal history of malignant neoplasm of cervix uteri: Secondary | ICD-10-CM | POA: Diagnosis not present

## 2022-10-13 DIAGNOSIS — Z90722 Acquired absence of ovaries, bilateral: Secondary | ICD-10-CM | POA: Insufficient documentation

## 2022-10-13 DIAGNOSIS — Z9221 Personal history of antineoplastic chemotherapy: Secondary | ICD-10-CM | POA: Diagnosis not present

## 2022-10-13 DIAGNOSIS — Z923 Personal history of irradiation: Secondary | ICD-10-CM | POA: Diagnosis not present

## 2022-10-13 DIAGNOSIS — Z08 Encounter for follow-up examination after completed treatment for malignant neoplasm: Secondary | ICD-10-CM

## 2022-10-13 DIAGNOSIS — C539 Malignant neoplasm of cervix uteri, unspecified: Secondary | ICD-10-CM

## 2022-10-13 NOTE — Patient Instructions (Signed)
It was good to see you today.  I do not see or feel any evidence of cancer recurrence.  The top of your incision has healed very well.   I will see you back in 6 months.  If you develop any of the symptoms that we reviewed today before your next incision, please call to see me sooner.  I will let you know once I have the results of your CT back in January.

## 2022-10-13 NOTE — Progress Notes (Signed)
Gynecologic Oncology Return Clinic Visit  10/13/22  Reason for Visit: surveillance in the setting of cervical cancer  Treatment History: Oncology History Overview Note  Initial biopsy - cervical biopsy, ECC ER 95%, PR 95%, MMR abnormal Negative genetics  PDL1: CPS 80%   Uterine cancer ([place])  01/20/2013 Pathology Results   Endometrium, biopsy - POLYPOID DEGENERATING SECRETORY-TYPE ENDOMETRIUM, SEE COMMENT   01/31/2013 Surgery   She underwent supracervical hysterectomy and bilateral salpingo-oophorectomy.  Uterus was ultimately morcellated for removal.   01/31/2013 Pathology Results   Uterus and bilateral fallopian tubes - ENDOMETRIUM: PROLIFERATIVE WITH BENIGN ENDOMETRIAL POLYP. NO HYPERPLASIA OR CARCINOMA. - MYOMETRIUM: ADENOMYOSIS. LEIOMYOMA. - UTERINE SEROSA: UNREMARKABLE. NO ENDOMETRIOSIS OR MALIGNANCY. - BILATERAL FALLOPIAN TUBES: UNREMARKABLE.   12/15/2021 Initial Diagnosis   The patient experienced postmenopausal bleeding.   01/30/2022 Imaging   Pelvic ultrasound exam was performed on 2/10 showing 18 x 18 x 16 cm masslike area within the cervix containing internal blood flow, question cervical fibroids or mass/neoplasm.     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 There is marked thinning of the normal stroma posterolaterally on the right without definite parametrial extension on today's study. No involvement of the rectum. Assessment of the upper posterior bladder wall is limited by surgical scarring  and susceptibility artifact from adjacent surgical clips in this region. Within this limitation, no focal bladder wall thickening or abnormal bladder wall enhancement is appreciated. Tumor extends posteriorly to the level of the external os, but no definite vaginal wall involvement appreciable by MRI.   No evidence for pelvic sidewall lymphadenopathy.     03/25/2022 Cancer Staging   Staging form: Corpus Uteri - Carcinoma and Carcinosarcoma, AJCC 8th Edition - Clinical stage from 03/25/2022: Stage II (cT2, cN0, cM0) - Signed by Heath Lark, MD on 03/25/2022 Stage prefix: Initial diagnosis   03/31/2022 Procedure   Successful placement of a right internal jugular approach power injectable Port-A-Cath. The catheter is ready for immediate use.     04/03/2022 - 05/01/2022 Chemotherapy   Patient is on Treatment Plan : Uterine Cisplatin days 1 and days 29 with radiation     06/02/2022 Imaging   1. Mass of the cervical remnant is unchanged in size, but demonstrates new central low attenuation which is likely due to necrosis.  2. No evidence of metastatic disease in the abdomen or pelvis. 3.  Aortic Atherosclerosis (ICD10-I70.0).   06/24/2022 Pathology Results   A. CERVIX, RESECTION:  - Invasive well-differentiated adenocarcinoma of the cervix, HPV-associated, involving both anterior and posterior cervix  - Carcinoma invades for a depth of 0.7 cm  - Radial resection margin is negative for carcinoma (0.6 cm)  - See oncology table   B. OVARIES, BILATERAL, AND LEFT FALLOPIAN TUBE, SALPINGO OOPHORECTOMY:  - Benign unremarkable bilateral ovaries and left fallopian tube   C. VAGINAL MARGIN, ANTERIOR AND POSTERIOR, EXCISION:  - Fragments of vaginal mucosa showing focal low-grade squamous intraepithelial lesion (CIN1, low grade dysplasia)  - Negative for carcinoma    07/13/2022 Procedure   Technically successful tunneled Port catheter removal.     Interval  History: Doing well.  Denies any vaginal  bleeding, discharge, change to urinary or bowel function.  Denies any abdominal or pelvic pain.  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;  completed radiation both breast 08/ 2016 and completed anastozole 04/ 2021   Depression    Dysuria    Elevated LFTs    FIGO stage II endometrial cancer ([place]) 01/2022   oncologist--- dr gorsuch/ radiation oncology-- dr Sondra Come;   s/p supracervical hysterectomy 02/ 2014 with retained ovaries;  PMB cervical bx endometroid adenocarcinoma ; started chemo 04-03-2022 and completed external beam radiation 05-08-2022 planned brachytherapy high dose radiation on 05-11-2022   History of external beam radiation therapy    completed 08/ 2016 both breast ;    IMRT completed 05-08-2022 for endometrial carinoma/ cervix   History of kidney stones    History of radiation therapy    Cervix-04/06/22-05/12/22- Dr. Gery Pray   Hyperlipidemia    Hypothyroidism    followed by pcp   Migraines    Nocturia more than twice per night    OSA (obstructive sleep apnea)    05-11-2022  per pt last not used in past 6-8 months (approx 10/ 2022) due to nocutria   Wears glasses     Past Surgical History:  Procedure Laterality Date   BILATERAL SALPINGECTOMY Bilateral 01/31/2013   Procedure: BILATERAL SALPINGECTOMY;  Surgeon: Jonnie Kind, MD;  Location: [facility];  Service: Gynecology;  Laterality: Bilateral;   BREAST LUMPECTOMY WITH AXILLARY LYMPH NODE BIOPSY Bilateral 04/15/2015   @ [facility] in [place];   x2 sites on left, x2 site on right,  left axilla node dissection   CESAREAN SECTION  02/19/1987   CHOLECYSTECTOMY OPEN  05/22/1987   CYSTOSCOPY WITH INJECTION N/A 06/24/2022   Procedure: CYSTOSCOPY  WITH INJECTION INDOCYANINE GREEN DYE;  Surgeon: Vira Agar, MD;  Location: [facility];  Service: Urology;  Laterality: N/A;   IR IMAGING GUIDED PORT INSERTION  03/30/2022   IR REMOVAL TUN ACCESS W/ PORT W/O FL MOD SED  07/10/2022   LAPAROSCOPIC SUPRACERVICAL HYSTERECTOMY N/A 01/31/2013   Procedure: LAPAROSCOPIC SUPRACERVICAL HYSTERECTOMY;  Surgeon: Jonnie Kind, MD;  Location: [facility];  Service: Gynecology;  Laterality: N/A;   OPERATIVE ULTRASOUND N/A 05/12/2022   Procedure: OPERATIVE ULTRASOUND;  Surgeon: Gery Pray, MD;  Location: [facility];  Service: Urology;  Laterality: N/A;   ROBOTIC ASSISTED TOTAL HYSTERECTOMY N/A 06/24/2022   Procedure: XI ROBOTIC ASSISTED MODIFIED RADICAL TRACHELECTOMY AND CYSTOSCOPY;  Surgeon: Lafonda Mosses, MD;  Location: [facility];  Service: Gynecology;  Laterality: N/A;   TANDEM RING INSERTION N/A 05/12/2022   Procedure: TANDEM RING INSERTION;  Surgeon: Gery Pray, MD;  Location: [facility];  Service: Urology;  Laterality: N/A;   VAGINECTOMY, PARTIAL N/A 06/24/2022   Procedure: UPPER VAGINECTOMY;  Surgeon: Lafonda Mosses, MD;  Location: [facility];  Service: Gynecology;  Laterality: N/A;    Family History  Problem Relation Age of Onset   COPD Mother    Cancer Father        lung   Colon cancer Father    Breast cancer Paternal Aunt        dx early  42s   Cirrhosis Maternal Grandmother        non-alcoholic cirrhosis   Heart attack Maternal Grandfather    Diabetes Maternal Grandfather    Colon cancer Paternal Grandfather        dx 50s   Lymphoma Cousin    Ovarian cancer Neg Hx    Endometrial cancer Neg Hx    Pancreatic cancer Neg Hx    Prostate cancer Neg Hx     Social History   Socioeconomic History   Marital status: Married    Spouse name: Not on file   Number of children: Not on file   Years of education: Not on file   Highest education level: Not on file  Occupational History   Occupation: substitute  teacher  Tobacco Use   Smoking status: Never   Smokeless tobacco: Never  Vaping Use   Vaping Use: Never used  Substance and Sexual Activity   Alcohol use: No   Drug use: Never   Sexual activity: Not Currently    Partners: Male  Other Topics Concern   Not on file  Social History Narrative   Not on file   Social Determinants of Health   Financial Resource Strain: Not on file  Food Insecurity: Not on file  Transportation Needs: Not on file  Physical Activity: Not on file  Stress: Not on file  Social Connections: Not on file    Current Medications:  Current Outpatient Medications:    acetaminophen (TYLENOL) 500 MG tablet, Take 1,000 mg by mouth every 6 (six) hours as needed for moderate pain., Disp: , Rfl:    atorvastatin (LIPITOR) 10 MG tablet, Take 5 mg by mouth daily. Taking 1/2 pill/day, Disp: , Rfl:    buPROPion (WELLBUTRIN SR) 150 MG 12 hr tablet, Take 150 mg by mouth 2 (two) times daily., Disp: , Rfl:    famotidine (PEPCID) 20 MG tablet, Take 1 tablet (20 mg total) by mouth 2 (two) times daily. (Patient taking differently: Take 20 mg by mouth 2 (two) times daily as needed for heartburn or indigestion.), Disp: 30 tablet, Rfl: 0   FLUoxetine (PROZAC) 40 MG capsule, Take 40 mg by mouth daily., Disp: , Rfl:    fluticasone (FLONASE) 50 MCG/ACT nasal spray, Place 2 sprays into both nostrils daily as needed for allergies., Disp: , Rfl:    levothyroxine (SYNTHROID, LEVOTHROID) 25 MCG tablet, Take 25 mcg by mouth daily., Disp: , Rfl:   Review of Systems: Denies appetite changes, fevers, chills, fatigue, unexplained weight changes. Denies hearing loss, neck lumps or masses, mouth sores, ringing in ears or voice changes. Denies cough or wheezing.  Denies shortness of breath. Denies chest pain or palpitations. Denies leg swelling. Denies abdominal distention, pain, blood in stools, constipation, diarrhea, nausea, vomiting, or early satiety. Denies pain with intercourse, dysuria,  frequency, hematuria or incontinence. Denies hot flashes, pelvic pain, vaginal bleeding or vaginal discharge.   Denies joint pain, back pain or muscle pain/cramps. Denies itching, rash, or wounds. Denies dizziness, headaches, numbness or seizures. Denies swollen lymph nodes or glands, denies easy bruising or bleeding. Denies anxiety, depression, confusion, or decreased concentration.  Physical Exam: BP (!) 147/76 (BP Location: Right Arm, Patient Position: Sitting)   Pulse 98   Temp 98.6 F (37 C) (Oral)   Resp 17   Ht 5' (1.524 m)   Wt 158 lb 8 oz (71.9 kg)   LMP 01/27/2013   SpO2 98%   BMI 30.95 kg/m  General: Alert, oriented, no acute distress.  HEENT: Normocephalic, atraumatic, sclera anicteric. Chest: Unlabored breathing on room air. Abdomen: Obese, soft, nontender.  Normoactive bowel sounds.  No masses or hepatosplenomegaly appreciated.  Well-healed incisions. Extremities: Grossly normal range of motion.  Warm, well perfused.  No edema bilaterally. Skin: No rashes or lesions noted. GU: Normal appearing external genitalia without erythema, excoriation, or lesions.  Speculum exam reveals cuff is intact some suture along the midportion of the incision still visible.  No masses appreciated.  On bimanual exam, cuff is smooth, no nodularity or masses.  This is confirmed on rectovaginal exam.  Laboratory & Radiologic Studies: None new.  Assessment & Plan: Elizabeth Bartlett is a 61 y.o. 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 Resection margin at the time of surgery was negative for malignancy. PDL1: CPS 80%. Given negative margins with good radiation response noted on interval surgery, plan for close surveillance.   Doing well.  NED on exam today.  Plan for 36-monthpostop CT to establish baseline for imaging.   Per NCCN surveillance recommendation, surveillance will  happen every 3 months for 2-3 years.  We will alternate these between radiation oncology and my clinic.  My clinic reached out to radiation oncology to help facilitate getting the patient scheduled for a follow-up visit in January.  She will see me back in April.  Will plan on a yearly pap test. We discussed signs and symptoms that would be concerning for cancer recurrence and should prompt a phone call before her next scheduled visit.   22 minutes of total time was spent for this patient encounter, including preparation, face-to-face counseling with the patient and coordination of care, and documentation of the encounter.  KJeral Pinch MD  Division of Gynecologic Oncology  Department of Obstetrics and Gynecology  [facility] [place]

## 2022-12-10 ENCOUNTER — Telehealth: Payer: Self-pay | Admitting: *Deleted

## 2022-12-10 NOTE — Telephone Encounter (Signed)
Spoke with the patient and scheduled a follow up appt with Dr Berline Lopes on 6/7

## 2022-12-25 ENCOUNTER — Ambulatory Visit (HOSPITAL_COMMUNITY)
Admission: RE | Admit: 2022-12-25 | Discharge: 2022-12-25 | Disposition: A | Discharge status: Home / Self Care | Payer: BC Managed Care – PPO | Source: Ambulatory Visit | Attending: Gynecologic Oncology | Admitting: Gynecologic Oncology

## 2022-12-25 DIAGNOSIS — C539 Malignant neoplasm of cervix uteri, unspecified: Secondary | ICD-10-CM | POA: Diagnosis not present

## 2022-12-25 MED ORDER — SODIUM CHLORIDE (PF) 0.9 % IJ SOLN
INTRAMUSCULAR | Status: AC
  Filled 2022-12-25: qty 50

## 2022-12-25 MED ORDER — IOHEXOL 300 MG/ML  SOLN
100.0000 mL | Freq: Once | INTRAMUSCULAR | Status: AC | PRN
  Administered 2022-12-25: 100 mL via INTRAVENOUS

## 2022-12-25 MED ORDER — IOHEXOL 9 MG/ML PO SOLN
ORAL | Status: AC
  Filled 2022-12-25: qty 1000

## 2022-12-28 ENCOUNTER — Telehealth: Payer: Self-pay | Admitting: *Deleted

## 2022-12-28 NOTE — Telephone Encounter (Signed)
Per Dr Berline Lopes moved appt from 6/7 to 4/5. Patient aware of date/time

## 2023-01-04 NOTE — Progress Notes (Addendum)
Elizabeth Bartlett is here today for follow up post radiation to the pelvic.  They completed their radiation on:  04-06-22 to 05-12-22  Does the patient complain of any of the following:  Pain: Denies any pain. Abdominal bloating: Denies any bloating. Diarrhea/Constipation: No Nausea/Vomiting: Denies Vaginal Discharge: Denies Blood in Urine or Stool: Denies Urinary Issues (dysuria/incomplete emptying/ incontinence/ increased frequency/urgency): Denies Does patient report using vaginal dilator 2-3 times a week and/or sexually active 2-3 weeks: States that she no longer have the dilator States that she is not sexual activity. Post radiation skin changes: None   Additional comments if applicable: Vitals:   00/45/99 1516  BP: 139/75  Pulse: 80  Resp: 20  Temp: 97.6 F (36.4 C)  SpO2: 99%  Weight: 72.3 kg  Height: 5' (1.524 m)

## 2023-01-11 ENCOUNTER — Other Ambulatory Visit: Payer: Self-pay

## 2023-01-11 ENCOUNTER — Encounter: Payer: Self-pay | Admitting: Radiation Oncology

## 2023-01-11 ENCOUNTER — Ambulatory Visit
Admission: RE | Admit: 2023-01-11 | Discharge: 2023-01-11 | Disposition: A | Discharge status: Home / Self Care | Payer: BC Managed Care – PPO | Source: Ambulatory Visit | Attending: Radiation Oncology | Admitting: Radiation Oncology

## 2023-01-11 VITALS — BP 139/75 | HR 80 | Temp 97.6°F | Resp 20 | Ht 60.0 in | Wt 159.4 lb

## 2023-01-11 DIAGNOSIS — Z923 Personal history of irradiation: Secondary | ICD-10-CM | POA: Insufficient documentation

## 2023-01-11 DIAGNOSIS — Z9221 Personal history of antineoplastic chemotherapy: Secondary | ICD-10-CM | POA: Insufficient documentation

## 2023-01-11 DIAGNOSIS — Z8541 Personal history of malignant neoplasm of cervix uteri: Secondary | ICD-10-CM | POA: Diagnosis not present

## 2023-01-11 DIAGNOSIS — Z79899 Other long term (current) drug therapy: Secondary | ICD-10-CM | POA: Insufficient documentation

## 2023-01-11 DIAGNOSIS — Z79890 Hormone replacement therapy (postmenopausal): Secondary | ICD-10-CM | POA: Diagnosis not present

## 2023-01-11 DIAGNOSIS — C541 Malignant neoplasm of endometrium: Secondary | ICD-10-CM

## 2023-01-11 DIAGNOSIS — C539 Malignant neoplasm of cervix uteri, unspecified: Secondary | ICD-10-CM

## 2023-01-11 NOTE — Progress Notes (Signed)
Radiation Oncology         [phone] ________________________________  Name: Elizabeth Bartlett MRN: [id number]  Date: 01/11/2023  DOB: March 11, 1961  Follow-Up Visit Note  CC: Lafonda Mosses, MD  Lafonda Mosses, MD    ICD-10-CM   1. Endometrial adenocarcinoma ([place])  C54.1     2. 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 Since her last visit, the patient opted to proceed with a trachelectomy, bilateral oophorectomy, left salpingectomy and cystoscopy on 06/24/22 under the care of Dr. Berline Lopes. Pathology form the procedure revealed invasive well-differentiated adenocarcinoma of the cervix involving both the anterior and posterior cervix ;p16 positive HPV-associated; all margins negative for carcinoma. Bilateral ovaries and left fallopian tube negative for malignancy. The anterior and posterior vaginal margin were also excised and showed no evidence of carcinoma with fragments with low-grade squamous intraepithelial lesion.    Given negative margins on final pathology, Dr. Berline Lopes does not recommend adjuvant treatment and the patient will proceed with close surveillance. Physical exam performed during her most recent follow-up visit with Dr. Berline Lopes on 10/13/22 showed NED and the patient denied any GU symptoms.      Pertinent imaging thus far includes a follow-up CT of the abdomen and pelvis with  contrast on 12/25/22 which shows no evidence of metastatic disease in the abdomen or pelvis. The previously seen cervical remnant mass is no longer appreciable.       Of note: the patient also had her port removed on 07/10/22.             On evaluation today the patient denies any abdominal bloating pelvic pain vaginal bleeding or discharge.  She denies any hematuria or rectal bleeding.  She has not been using her vaginal dilator and reports she no longer has this equipment.        Allergies:  has No Known Allergies.  Meds: Current Outpatient Medications  Medication Sig Dispense Refill   acetaminophen (TYLENOL) 500 MG tablet Take 1,000 mg by mouth every 6 (six) hours as needed for moderate pain.     atorvastatin (LIPITOR) 10 MG tablet Take 5 mg by mouth daily. Taking 1/2 pill/day     buPROPion (WELLBUTRIN SR) 150 MG 12 hr tablet Take 150 mg by mouth 2 (two) times daily.     famotidine (PEPCID) 20 MG tablet Take 1 tablet (20 mg total) by mouth 2 (two) times daily. (Patient taking differently: Take 20 mg by mouth 2 (two) times daily as needed for heartburn or indigestion.) 30 tablet 0   FLUoxetine (PROZAC) 40 MG capsule Take 40 mg by mouth daily.     fluticasone (FLONASE) 50 MCG/ACT nasal spray Place 2 sprays into both nostrils daily as needed for allergies.     levothyroxine (SYNTHROID, LEVOTHROID) 25 MCG tablet Take 25 mcg by mouth daily.     No current facility-administered medications for this encounter.    Physical Findings:  The patient is in no acute distress. Patient is alert and oriented.  height is 5' (1.524 m) and weight is 159 lb 6.4 oz (72.3 kg). Her temperature is 97.6 F (36.4 C). Her blood pressure is 139/75 and her pulse is 80. Her respiration is 20 and oxygen saturation is 99%. .   Lungs are clear to auscultation bilaterally. Heart has regular rate and rhythm. No palpable cervical, supraclavicular, or axillary adenopathy. Abdomen soft, non-tender, normal bowel sounds.  On  pelvic examination the external genitalia were unremarkable. A speculum exam was performed. There are no mucosal lesions noted in the vaginal vault.  The vaginal vault is somewhat foreshortened.  On bimanual and rectovaginal examination no pelvic masses appreciated.  Vaginal cuff intact.  Lab Findings: Lab Results  Component Value Date   WBC 12.9 (H) 06/25/2022   HGB 11.1 (L) 06/25/2022   HCT 34.0 (L) 06/25/2022   MCV 97.1 06/25/2022   PLT 260 06/25/2022    Radiographic Findings: CT Abdomen Pelvis W Contrast  Result Date: 12/27/2022 CLINICAL DATA:  Cervical cancer, monitor. EXAM: CT ABDOMEN AND PELVIS WITH CONTRAST TECHNIQUE: Multidetector CT imaging of the abdomen and pelvis was performed using the standard protocol following bolus administration of intravenous contrast. RADIATION DOSE REDUCTION: This exam was performed according to the departmental dose-optimization program which includes automated exposure control, adjustment of the mA and/or kV according to patient size and/or use of iterative reconstruction technique. CONTRAST:  169m OMNIPAQUE IOHEXOL 300 MG/ML  SOLN COMPARISON:  06/09/2022. FINDINGS: Lower chest: No acute abnormality. Hepatobiliary: No focal liver abnormality is seen. Status post cholecystectomy. No biliary dilatation. Pancreas: Unremarkable. No pancreatic ductal dilatation or surrounding inflammatory changes. Spleen: Normal in size without focal abnormality. Adrenals/Urinary Tract: No adrenal nodule or mass. The kidneys enhance symmetrically. No renal calculus or hydronephrosis. The bladder is unremarkable. Stomach/Bowel: Stomach is within normal limits. Appendix appears normal. No evidence of bowel wall thickening, distention, or inflammatory changes. No free air or pneumatosis. Vascular/Lymphatic: Minimal aortic atherosclerosis. No enlarged abdominal or pelvic lymph nodes. Reproductive: Status post hysterectomy. The previously cervical remnant mass is no longer seen. No  adnexal masses. Other: No abdominopelvic ascites. Musculoskeletal: Degenerative changes in the thoracolumbar spine. No acute or suspicious osseous abnormality IMPRESSION: 1. Previously described cervical remnant mass is no longer seen. No evidence of metastatic disease in the abdomen and pelvis. 2. Minimal aortic atherosclerosis. Electronically Signed   By: LBrett FairyM.D.   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  She does not appear to be exhibiting any aftereffects of her radiation and surgery.  Stressed the importance of using her vaginal dilator and she has been given additional equipment to use.  Plan: She will follow-up with Dr. TBerline Lopesin 3 months.  Routine follow-up in radiation oncology in 6 months.   25 minutes of total time was spent for this patient encounter, including preparation, face-to-face counseling with the patient and coordination of care, physical exam, and documentation of the encounter. ____________________________________  JBlair Promise PhD, MD  This document serves as a record of services personally performed by JGery Pray MD. It was created on his behalf by ERoney Mans a trained medical scribe. The creation of this record is based on the scribe's personal observations and the provider's statements to them. This document has been checked and  approved by the attending provider.

## 2023-03-24 ENCOUNTER — Encounter: Payer: Self-pay | Admitting: Gynecologic Oncology

## 2023-03-26 ENCOUNTER — Other Ambulatory Visit (HOSPITAL_COMMUNITY)
Admission: RE | Admit: 2023-03-26 | Discharge: 2023-03-26 | Disposition: A | Discharge status: Home / Self Care | Payer: BC Managed Care – PPO | Source: Ambulatory Visit | Attending: Gynecologic Oncology | Admitting: Gynecologic Oncology

## 2023-03-26 ENCOUNTER — Inpatient Hospital Stay: Payer: BC Managed Care – PPO | Attending: Gynecologic Oncology | Admitting: Gynecologic Oncology

## 2023-03-26 ENCOUNTER — Encounter: Payer: Self-pay | Admitting: Gynecologic Oncology

## 2023-03-26 VITALS — BP 146/86 | HR 75 | Temp 98.4°F | Wt 165.1 lb

## 2023-03-26 DIAGNOSIS — Z9221 Personal history of antineoplastic chemotherapy: Secondary | ICD-10-CM | POA: Insufficient documentation

## 2023-03-26 DIAGNOSIS — Z90722 Acquired absence of ovaries, bilateral: Secondary | ICD-10-CM | POA: Insufficient documentation

## 2023-03-26 DIAGNOSIS — C539 Malignant neoplasm of cervix uteri, unspecified: Secondary | ICD-10-CM | POA: Insufficient documentation

## 2023-03-26 DIAGNOSIS — Z08 Encounter for follow-up examination after completed treatment for malignant neoplasm: Secondary | ICD-10-CM | POA: Diagnosis present

## 2023-03-26 DIAGNOSIS — Z9079 Acquired absence of other genital organ(s): Secondary | ICD-10-CM | POA: Diagnosis not present

## 2023-03-26 DIAGNOSIS — Z90710 Acquired absence of both cervix and uterus: Secondary | ICD-10-CM | POA: Diagnosis not present

## 2023-03-26 DIAGNOSIS — Z8541 Personal history of malignant neoplasm of cervix uteri: Secondary | ICD-10-CM | POA: Diagnosis present

## 2023-03-26 DIAGNOSIS — Z923 Personal history of irradiation: Secondary | ICD-10-CM | POA: Insufficient documentation

## 2023-03-26 NOTE — Patient Instructions (Addendum)
It was good to see you today.  I do not see or feel any evidence of cancer recurrence on your exam.  I will leave pap test to you in mychart.  I will see you for follow-up in 6 months.  As always, if you develop any new and concerning symptoms before your next visit, please call to see me sooner.

## 2023-03-26 NOTE — Progress Notes (Signed)
Gynecologic Oncology Return Clinic Visit  03/26/23  Reason for Visit: surveillance in the setting of cervical cancer   Treatment History: Oncology History Overview Note  Initial biopsy - cervical biopsy, ECC ER 95%, PR 95%, MMR abnormal Negative genetics  PDL1: CPS 80%   Uterine cancer  01/20/2013 Pathology Results   Endometrium, biopsy - POLYPOID DEGENERATING SECRETORY-TYPE ENDOMETRIUM, SEE COMMENT   01/31/2013 Surgery   She underwent supracervical hysterectomy and bilateral salpingo-oophorectomy.  Uterus was ultimately morcellated for removal.   01/31/2013 Pathology Results   Uterus and bilateral fallopian tubes - ENDOMETRIUM: PROLIFERATIVE WITH BENIGN ENDOMETRIAL POLYP. NO HYPERPLASIA OR CARCINOMA. - MYOMETRIUM: ADENOMYOSIS. LEIOMYOMA. - UTERINE SEROSA: UNREMARKABLE. NO ENDOMETRIOSIS OR MALIGNANCY. - BILATERAL FALLOPIAN TUBES: UNREMARKABLE.   12/15/2021 Initial Diagnosis   The patient experienced postmenopausal bleeding.   01/30/2022 Imaging   Pelvic ultrasound exam was performed on 2/10 showing 18 x 18 x 16 cm masslike area within the cervix containing internal blood flow, question cervical fibroids or mass/neoplasm.     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 There is marked thinning of the normal stroma posterolaterally on the right without definite parametrial extension on today's study. No involvement of the rectum. Assessment of the upper posterior bladder wall is limited by surgical scarring and  susceptibility artifact from adjacent surgical clips in this region. Within this limitation, no focal bladder wall thickening or abnormal bladder wall enhancement is appreciated. Tumor extends posteriorly to the level of the external os, but no definite vaginal wall involvement appreciable by MRI.   No evidence for pelvic sidewall lymphadenopathy.     03/25/2022 Cancer Staging   Staging form: Corpus Uteri - Carcinoma and Carcinosarcoma, AJCC 8th Edition - Clinical stage from 03/25/2022: Stage II (cT2, cN0, cM0) - Signed by Artis Delay, MD on 03/25/2022 Stage prefix: Initial diagnosis   03/31/2022 Procedure   Successful placement of a right internal jugular approach power injectable Port-A-Cath. The catheter is ready for immediate use.     04/03/2022 - 05/01/2022 Chemotherapy   Patient is on Treatment Plan : Uterine Cisplatin days 1 and days 29 with radiation     06/02/2022 Imaging   1. Mass of the cervical remnant is unchanged in size, but demonstrates new central low attenuation which is likely due to necrosis.  2. No evidence of metastatic disease in the abdomen or pelvis. 3.  Aortic Atherosclerosis (ICD10-I70.0).   06/24/2022 Pathology Results   A. CERVIX, RESECTION:  - Invasive well-differentiated adenocarcinoma of the cervix, HPV-associated, involving both anterior and posterior cervix  - Carcinoma invades for a depth of 0.7 cm  - Radial resection margin is negative for carcinoma (0.6 cm)  - See oncology table   B. OVARIES, BILATERAL, AND LEFT FALLOPIAN TUBE, SALPINGO OOPHORECTOMY:  - Benign unremarkable bilateral ovaries and left fallopian tube   C. VAGINAL MARGIN, ANTERIOR AND POSTERIOR, EXCISION:  - Fragments of vaginal mucosa showing focal low-grade squamous intraepithelial lesion (CIN1, low grade dysplasia)  - Negative for carcinoma    07/13/2022 Procedure   Technically successful tunneled Port catheter removal.     Interval  History: Doing well.  Denies any bleeding or  discharge.  Denies any abdominal or pelvic pain.  Reports baseline bowel bladder function.  Had mammogram updated in February of this year.  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;  completed radiation both breast 08/ 2016 and completed anastozole 04/ 2021   Depression    Dysuria    Elevated LFTs    FIGO stage II endometrial cancer 01/2022   oncologist--- dr gorsuch/ radiation oncology-- dr Roselind Messier;   s/p supracervical hysterectomy 02/ 2014 with retained ovaries;  PMB cervical bx endometroid adenocarcinoma ; started chemo 04-03-2022 and completed external beam radiation 05-08-2022 planned brachytherapy high dose radiation on 05-11-2022   History of external beam radiation therapy    completed 08/ 2016 both breast ;    IMRT completed 05-08-2022 for endometrial carinoma/ cervix   History of kidney stones    History of radiation therapy    Cervix-04/06/22-05/12/22- Dr. Antony Blackbird   Hyperlipidemia    Hypothyroidism    followed by pcp   Migraines    Nocturia more than twice per night    OSA (obstructive sleep apnea)    05-11-2022  per pt last not used in past 6-8 months (approx 10/ 2022) due to nocutria   Wears glasses     Past Surgical History:  Procedure Laterality Date   BILATERAL SALPINGECTOMY Bilateral 01/31/2013   Procedure: BILATERAL SALPINGECTOMY;  Surgeon: Tilda Burrow, MD;  Location: [facility];  Service: Gynecology;  Laterality: Bilateral;   BREAST LUMPECTOMY WITH AXILLARY LYMPH NODE BIOPSY Bilateral 04/15/2015   @ [facility] in [place];   x2 sites on left, x2 site on right,  left axilla node dissection   CESAREAN SECTION  02/19/1987   CHOLECYSTECTOMY OPEN  05/22/1987   CYSTOSCOPY WITH INJECTION N/A  06/24/2022   Procedure: CYSTOSCOPY WITH INJECTION INDOCYANINE GREEN DYE;  Surgeon: Despina Arias, MD;  Location: [facility];  Service: Urology;  Laterality: N/A;   IR IMAGING GUIDED PORT INSERTION  03/30/2022   IR REMOVAL TUN ACCESS W/ PORT W/O FL MOD SED  07/10/2022   LAPAROSCOPIC SUPRACERVICAL HYSTERECTOMY N/A 01/31/2013   Procedure: LAPAROSCOPIC SUPRACERVICAL HYSTERECTOMY;  Surgeon: Tilda Burrow, MD;  Location: [facility];  Service: Gynecology;  Laterality: N/A;   OPERATIVE ULTRASOUND N/A 05/12/2022   Procedure: OPERATIVE ULTRASOUND;  Surgeon: Antony Blackbird, MD;  Location: [facility];  Service: Urology;  Laterality: N/A;   ROBOTIC ASSISTED TOTAL HYSTERECTOMY N/A 06/24/2022   Procedure: XI ROBOTIC ASSISTED MODIFIED RADICAL TRACHELECTOMY AND CYSTOSCOPY;  Surgeon: Carver Fila, MD;  Location: [facility];  Service: Gynecology;  Laterality: N/A;   TANDEM RING INSERTION N/A 05/12/2022   Procedure: TANDEM RING INSERTION;  Surgeon: Antony Blackbird, MD;  Location: [facility];  Service: Urology;  Laterality: N/A;   VAGINECTOMY, PARTIAL N/A 06/24/2022   Procedure: UPPER VAGINECTOMY;  Surgeon: Carver Fila, MD;  Location: [facility];  Service: Gynecology;  Laterality: N/A;    Family History  Problem Relation Age of Onset   COPD Mother    Cancer Father        lung   Colon cancer Father    Breast cancer Paternal Aunt  dx early 6570s   Cirrhosis Maternal Grandmother        non-alcoholic cirrhosis   Heart attack Maternal Grandfather    Diabetes Maternal Grandfather    Colon cancer Paternal Grandfather        dx 6160s   Lymphoma Cousin    Ovarian cancer Neg Hx    Endometrial cancer Neg Hx    Pancreatic cancer Neg Hx    Prostate cancer Neg Hx     Social History   Socioeconomic History   Marital status: Married    Spouse name: Not on file   Number of children: Not on file   Years of education: Not on file   Highest education level: Not on file  Occupational  History   Occupation: substitute teacher  Tobacco Use   Smoking status: Never   Smokeless tobacco: Never  Vaping Use   Vaping Use: Never used  Substance and Sexual Activity   Alcohol use: No   Drug use: Never   Sexual activity: Not Currently    Partners: Male  Other Topics Concern   Not on file  Social History Narrative   Not on file   Social Determinants of Health   Financial Resource Strain: Not on file  Food Insecurity: Not on file  Transportation Needs: Not on file  Physical Activity: Not on file  Stress: Not on file  Social Connections: Not on file    Current Medications:  Current Outpatient Medications:    acetaminophen (TYLENOL) 500 MG tablet, Take 1,000 mg by mouth every 6 (six) hours as needed for moderate pain., Disp: , Rfl:    atorvastatin (LIPITOR) 10 MG tablet, Take 5 mg by mouth daily. Taking 1/2 pill/day, Disp: , Rfl:    buPROPion (WELLBUTRIN XL) 300 MG 24 hr tablet, Take 300 mg by mouth daily., Disp: , Rfl:    famotidine (PEPCID) 20 MG tablet, Take 1 tablet (20 mg total) by mouth 2 (two) times daily. (Patient taking differently: Take 20 mg by mouth 2 (two) times daily as needed for heartburn or indigestion.), Disp: 30 tablet, Rfl: 0   FLUoxetine (PROZAC) 40 MG capsule, Take 40 mg by mouth daily., Disp: , Rfl:    fluticasone (FLONASE) 50 MCG/ACT nasal spray, Place 2 sprays into both nostrils daily as needed for allergies., Disp: , Rfl:    levothyroxine (SYNTHROID, LEVOTHROID) 25 MCG tablet, Take 25 mcg by mouth daily., Disp: , Rfl:   Review of Systems: Denies appetite changes, fevers, chills, fatigue, unexplained weight changes. Denies hearing loss, neck lumps or masses, mouth sores, ringing in ears or voice changes. Denies cough or wheezing.  Denies shortness of breath. Denies chest pain or palpitations. Denies leg swelling. Denies abdominal distention, pain, blood in stools, constipation, diarrhea, nausea, vomiting, or early satiety. Denies pain with  intercourse, dysuria, frequency, hematuria or incontinence. Denies hot flashes, pelvic pain, vaginal bleeding or vaginal discharge.   Denies joint pain, back pain or muscle pain/cramps. Denies itching, rash, or wounds. Denies dizziness, headaches, numbness or seizures. Denies swollen lymph nodes or glands, denies easy bruising or bleeding. Denies anxiety, depression, confusion, or decreased concentration.  Physical Exam: BP (!) 146/86 (BP Location: Left Arm, Patient Position: Sitting) Comment: informed Dr Pricilla Holmucker  Pulse 75   Temp 98.4 F (36.9 C) (Oral)   Wt 165 lb 1.6 oz (74.9 kg)   LMP 01/27/2013   SpO2 98%   BMI 32.24 kg/m  eneral: Alert, oriented, no acute distress. HEENT: Normocephalic, atraumatic, sclera anicteric. Chest: Unlabored breathing  on room air. Abdomen: Obese, soft, nontender.  Normoactive bowel sounds.  No masses or hepatosplenomegaly appreciated.  Well-healed incisions. Extremities: Grossly normal range of motion.  Warm, well perfused.  No edema bilaterally. Skin: No rashes or lesions noted. GU: Normal appearing external genitalia without erythema, excoriation, or lesions.  Speculum exam reveals cuff is intact some suture along the midportion of the incision still visible.  No masses appreciated.  On bimanual exam, cuff is smooth, no nodularity or masses.  This is confirmed on rectovaginal exam.  Laboratory & Radiologic Studies: 12/25/21: CT A/P 1. Previously described cervical remnant mass is no longer seen. No evidence of metastatic disease in the abdomen and pelvis. 2. Minimal aortic atherosclerosis.  Assessment & Plan: Elizabeth Bartlett is a 62 y.o. 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 Resection margin at the time of surgery was negative for malignancy. PDL1: CPS 80%. Given negative margins with good radiation response noted on interval  surgery, plan for close surveillance.   Doing well.  NED on exam today.  Pap test and cotesting performed today.  I will contact patient with these results.   Reviewed recent imaging which was negative for any signs of cancer recurrence.  I gave the patient a copy of her CT today.    Per NCCN surveillance recommendation, surveillance will happen every 3 months for 2-3 years.  We will alternate these between radiation oncology and my clinic.  She sees Dr. Roselind Messier in July.  She will see me back in October.  Will plan on a yearly pap test. We discussed signs and symptoms that would be concerning for cancer recurrence and should prompt a phone call before her next scheduled visit.   20 minutes of total time was spent for this patient encounter, including preparation, face-to-face counseling with the patient and coordination of care, and documentation of the encounter.  Eugene Garnet, MD  Division of Gynecologic Oncology  Department of Obstetrics and Gynecology  [facility]

## 2023-04-05 LAB — CYTOLOGY - PAP
Comment: NEGATIVE
Diagnosis: NEGATIVE
Diagnosis: REACTIVE
High risk HPV: NEGATIVE

## 2023-04-06 ENCOUNTER — Telehealth: Payer: Self-pay

## 2023-04-06 NOTE — Telephone Encounter (Signed)
Per Dr.Tucker, pt is aware of negative pap smear results. She was thankful for the call

## 2023-04-06 NOTE — Progress Notes (Signed)
Would you please call this patient to let her know pap and HPV are both negative? Message sent in [place]. Thank you

## 2023-04-06 NOTE — Telephone Encounter (Signed)
-----   Message from Carver Fila, MD sent at 04/06/2023 12:21 PM EDT ----- Would you please call this patient to let her know pap and HPV are both negative? Message sent in [place]. Thank you

## 2023-05-28 ENCOUNTER — Ambulatory Visit: Payer: BC Managed Care – PPO | Admitting: Gynecologic Oncology

## 2023-07-12 ENCOUNTER — Ambulatory Visit: Payer: Self-pay | Admitting: Radiation Oncology

## 2023-07-21 NOTE — Progress Notes (Signed)
Radiation Oncology         [phone] ________________________________  Name: Elizabeth Bartlett MRN: [id number]  Date: 07/22/2023  DOB: [birth year]  Follow-Up Visit Note  CC: Carver Fila, MD  Carver Fila, MD    ICD-10-CM   1. 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 She was last seen here for follow-up on 01/11/23. Since her last visit, the patient followed up with Dr. Pricilla Holm on 03/26/23, during which time, the patient denied any symptoms concerning for disease recurrence and she was noted as NED on examination. Pap and HPV testing were also performed during this visit which both came back negative.    Pertinent imaging performed in the interval includes a CT [facility] on 12/25/22 which demonstrated no evidence of metastatic disease or disease recurrence in the abdomen or pelvis.          She also presented for a routine screening mammogram on 02/25/23 which showed no evidence of malignancy in either breast.            She denies any vaginal bleeding, abdominal pain, abnormal bloating, or blood in her urine or stool. She is using her dilator once per week.            Allergies:  has No Known Allergies.  Meds: Current Outpatient Medications  Medication Sig Dispense Refill   acetaminophen (TYLENOL) 500 MG tablet Take 1,000 mg by mouth  every 6 (six) hours as needed for moderate pain.     atorvastatin (LIPITOR) 10 MG tablet Take 5 mg by mouth daily. Taking 1/2 pill/day     buPROPion (WELLBUTRIN XL) 300 MG 24 hr tablet Take 300 mg by mouth daily.     famotidine (PEPCID) 20 MG tablet Take 1 tablet (20 mg total) by mouth 2 (two) times daily. (Patient taking differently: Take 20 mg by mouth 2 (two) times daily as needed for heartburn or indigestion.) 30 tablet 0   FLUoxetine (PROZAC) 40 MG capsule Take 40 mg by mouth daily.     fluticasone (FLONASE) 50 MCG/ACT nasal spray Place 2 sprays into both nostrils daily as needed for allergies.     levothyroxine (SYNTHROID, LEVOTHROID) 25 MCG tablet Take 25 mcg by mouth daily.     No current facility-administered medications for this encounter.    Physical Findings: The patient is in no acute distress. Patient is alert and oriented.  height is 5' (1.524 m) and weight is 158 lb 9.6 oz (71.9 kg). Her temperature is 97.6 F (36.4 C). Her blood pressure is 139/78 and her pulse is 80. Her respiration is 18 and oxygen saturation is 98%. .  No significant changes. Lungs are clear to auscultation bilaterally. Heart has regular rate and rhythm. No palpable cervical, supraclavicular, or axillary adenopathy. Abdomen soft, non-tender, normal bowel sounds.  On pelvic examination the external genitalia were unremarkable. A speculum exam was performed. There are no  mucosal lesions noted in the vaginal vault.  The vaginal vault is somewhat foreshortened.  On bimanual and rectovaginal examination no pelvic masses appreciated.  Vaginal cuff intact.  Rectal sphincter tone good.   Lab Findings: Lab Results  Component Value Date   WBC 12.9 (H) 06/25/2022   HGB 11.1 (L) 06/25/2022   HCT 34.0 (L) 06/25/2022   MCV 97.1 06/25/2022   PLT 260 06/25/2022    Radiographic Findings: No results found.  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 No signs of disease recurrence on clinical exam today.   Plan:  Encouraged to use dilator 2-3 times per week.   Patient is approximately one year out from treatment. Per NCCN guidelines, we will continue close follow-up. She will see Dr. Pricilla Holm in October and radiation in January of next year. She was educated on signs/symptoms of recurrence and instructed to contact us if she develops any of these in the meantime.    30 minutes of total time was spent for this patient encounter, including preparation, face-to-face counseling with the patient and coordination of care, physical exam, and documentation of the encounter.  ____________________________________   Joyice Faster, PA-C   Billie Lade, PhD, MD  This document serves as a record of services personally performed by Antony Blackbird, MD. It was created on his behalf by Neena Rhymes, a trained medical scribe. The creation of this record is based on the scribe's personal observations and the provider's statements to them. This document has been checked and approved by the attending provider.

## 2023-07-22 ENCOUNTER — Ambulatory Visit
Admission: RE | Admit: 2023-07-22 | Discharge: 2023-07-22 | Disposition: A | Discharge status: Home / Self Care | Payer: BC Managed Care – PPO | Source: Ambulatory Visit | Attending: Radiation Oncology | Admitting: Radiation Oncology

## 2023-07-22 ENCOUNTER — Other Ambulatory Visit: Payer: Self-pay

## 2023-07-22 ENCOUNTER — Encounter: Payer: Self-pay | Admitting: Radiation Oncology

## 2023-07-22 VITALS — BP 139/78 | HR 80 | Temp 97.6°F | Resp 18 | Ht 60.0 in | Wt 158.6 lb

## 2023-07-22 DIAGNOSIS — Z923 Personal history of irradiation: Secondary | ICD-10-CM | POA: Diagnosis not present

## 2023-07-22 DIAGNOSIS — Z79899 Other long term (current) drug therapy: Secondary | ICD-10-CM | POA: Insufficient documentation

## 2023-07-22 DIAGNOSIS — Z9221 Personal history of antineoplastic chemotherapy: Secondary | ICD-10-CM | POA: Diagnosis not present

## 2023-07-22 DIAGNOSIS — Z79890 Hormone replacement therapy (postmenopausal): Secondary | ICD-10-CM | POA: Insufficient documentation

## 2023-07-22 DIAGNOSIS — C539 Malignant neoplasm of cervix uteri, unspecified: Secondary | ICD-10-CM

## 2023-07-22 NOTE — Progress Notes (Signed)
Elizabeth Bartlett is here today for follow up post radiation to the pelvic.  They completed their radiation on: 02/12/2022  Does the patient complain of any of the following:  Pain:No Abdominal bloating: No Diarrhea/Constipation: No Nausea/Vomiting: No Vaginal Discharge: No Blood in Urine or Stool: No urinary Issues (dysuria/incomplete emptying/ incontinence/ increased frequency/urgency): No Does patient report using vaginal dilator 2-3 times a week and/or sexually active 2-3 weeks: She states that she is using the dilator once a week Post radiation skin changes: No   BP 139/78 (BP Location: Right Arm, Patient Position: Sitting, Cuff Size: Large)   Pulse 80   Temp 97.6 F (36.4 C)   Resp 18   Ht 5' (1.524 m)   Wt 158 lb 9.6 oz (71.9 kg)   LMP 01/27/2013   SpO2 98%   BMI 30.97 kg/m

## 2023-08-27 ENCOUNTER — Encounter: Payer: Self-pay | Admitting: Obstetrics and Gynecology

## 2023-09-23 ENCOUNTER — Telehealth: Payer: Self-pay | Admitting: *Deleted

## 2023-09-23 NOTE — Telephone Encounter (Signed)
Spoke with the patient and moved appt from 10/11 to 11/8

## 2023-10-01 ENCOUNTER — Ambulatory Visit: Payer: BC Managed Care – PPO | Admitting: Gynecologic Oncology

## 2023-10-18 ENCOUNTER — Telehealth: Payer: Self-pay | Admitting: *Deleted

## 2023-10-18 NOTE — Telephone Encounter (Signed)
Spoke with the patient and rescheduled appt from 11/8 to 11/1

## 2023-10-21 NOTE — Progress Notes (Signed)
Gynecologic Oncology Return Clinic Visit  10/22/23  Reason for Visit: follow-up  Treatment History: Oncology History Overview Note  Initial biopsy - cervical biopsy, ECC ER 95%, PR 95%, MMR abnormal Negative genetics  PDL1: CPS 80%   Uterine cancer (HCC)  01/20/2013 Pathology Results   Endometrium, biopsy - POLYPOID DEGENERATING SECRETORY-TYPE ENDOMETRIUM, SEE COMMENT   01/31/2013 Surgery   She underwent supracervical hysterectomy and bilateral salpingo-oophorectomy.  Uterus was ultimately morcellated for removal.   01/31/2013 Pathology Results   Uterus and bilateral fallopian tubes - ENDOMETRIUM: PROLIFERATIVE WITH BENIGN ENDOMETRIAL POLYP. NO HYPERPLASIA OR CARCINOMA. - MYOMETRIUM: ADENOMYOSIS. LEIOMYOMA. - UTERINE SEROSA: UNREMARKABLE. NO ENDOMETRIOSIS OR MALIGNANCY. - BILATERAL FALLOPIAN TUBES: UNREMARKABLE.   12/15/2021 Initial Diagnosis   The patient experienced postmenopausal bleeding.   01/30/2022 Imaging   Pelvic ultrasound exam was performed on 2/10 showing 18 x 18 x 16 cm masslike area within the cervix containing internal blood flow, question cervical fibroids or mass/neoplasm.     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 There is marked thinning of the normal stroma posterolaterally on the right without definite parametrial extension on today's study. No involvement of the rectum. Assessment of the upper posterior bladder wall is limited by surgical scarring and susceptibility artifact from  adjacent surgical clips in this region. Within this limitation, no focal bladder wall thickening or abnormal bladder wall enhancement is appreciated. Tumor extends posteriorly to the level of the external os, but no definite vaginal wall involvement appreciable by MRI.   No evidence for pelvic sidewall lymphadenopathy.     03/25/2022 Cancer Staging   Staging form: Corpus Uteri - Carcinoma and Carcinosarcoma, AJCC 8th Edition - Clinical stage from 03/25/2022: Stage II (cT2, cN0, cM0) - Signed by Artis Delay, MD on 03/25/2022 Stage prefix: Initial diagnosis   03/31/2022 Procedure   Successful placement of a right internal jugular approach power injectable Port-A-Cath. The catheter is ready for immediate use.     04/03/2022 - 05/01/2022 Chemotherapy   Patient is on Treatment Plan : Uterine Cisplatin days 1 and days 29 with radiation     06/02/2022 Imaging   1. Mass of the cervical remnant is unchanged in size, but demonstrates new central low attenuation which is likely due to necrosis.  2. No evidence of metastatic disease in the abdomen or pelvis. 3.  Aortic Atherosclerosis (ICD10-I70.0).   06/24/2022 Pathology Results   A. CERVIX, RESECTION:  - Invasive well-differentiated adenocarcinoma of the cervix, HPV-associated, involving both anterior and posterior cervix  - Carcinoma invades for a depth of 0.7 cm  - Radial resection margin is negative for carcinoma (0.6 cm)  - See oncology table   B. OVARIES, BILATERAL, AND LEFT FALLOPIAN TUBE, SALPINGO OOPHORECTOMY:  - Benign unremarkable bilateral ovaries and left fallopian tube   C. VAGINAL MARGIN, ANTERIOR AND POSTERIOR, EXCISION:  - Fragments of vaginal mucosa showing focal low-grade squamous intraepithelial lesion (CIN1, low grade dysplasia)  - Negative for carcinoma    07/13/2022 Procedure   Technically successful tunneled Port catheter removal.     Interval History: Doing well.  Denies any  vaginal bleeding or discharge.  Denies pelvic or  abdominal pain.  Reports normal bowel and bladder function.  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;  completed radiation both breast 08/ 2016 and completed anastozole 04/ 2021   Depression    Dysuria    Elevated LFTs    FIGO stage II endometrial cancer (HCC) 01/2022   oncologist--- dr gorsuch/ radiation oncology-- dr Roselind Messier;   s/p supracervical hysterectomy 02/ 2014 with retained ovaries;  PMB cervical bx endometroid adenocarcinoma ; started chemo 04-03-2022 and completed external beam radiation 05-08-2022 planned brachytherapy high dose radiation on 05-11-2022   History of external beam radiation therapy    completed 08/ 2016 both breast ;    IMRT completed 05-08-2022 for endometrial carinoma/ cervix   History of kidney stones    History of radiation therapy    Cervix-04/06/22-05/12/22- Dr. Antony Blackbird   Hyperlipidemia    Hypothyroidism    followed by pcp   Migraines    Nocturia more than twice per night    OSA (obstructive sleep apnea)    05-11-2022  per pt last not used in past 6-8 months (approx 10/ 2022) due to nocutria   Wears glasses     Past Surgical History:  Procedure Laterality Date   BILATERAL SALPINGECTOMY Bilateral 01/31/2013   Procedure: BILATERAL SALPINGECTOMY;  Surgeon: Tilda Burrow, MD;  Location: [facility];  Service: Gynecology;  Laterality: Bilateral;   BREAST LUMPECTOMY WITH AXILLARY LYMPH NODE BIOPSY Bilateral 04/15/2015   @ [facility] in [place];   x2 sites on left, x2 site on right,  left axilla node dissection   CESAREAN SECTION  02/19/1987   CHOLECYSTECTOMY OPEN  05/22/1987   CYSTOSCOPY WITH INJECTION N/A 06/24/2022   Procedure: CYSTOSCOPY WITH INJECTION INDOCYANINE GREEN  DYE;  Surgeon: Despina Arias, MD;  Location: [facility];  Service: Urology;  Laterality: N/A;   IR IMAGING GUIDED PORT INSERTION  03/30/2022   IR REMOVAL TUN ACCESS W/ PORT W/O FL MOD SED  07/10/2022   LAPAROSCOPIC SUPRACERVICAL HYSTERECTOMY N/A 01/31/2013   Procedure: LAPAROSCOPIC SUPRACERVICAL HYSTERECTOMY;  Surgeon: Tilda Burrow, MD;  Location: [facility];  Service: Gynecology;  Laterality: N/A;   OPERATIVE ULTRASOUND N/A 05/12/2022   Procedure: OPERATIVE ULTRASOUND;  Surgeon: Antony Blackbird, MD;  Location: [facility];  Service: Urology;  Laterality: N/A;   ROBOTIC ASSISTED TOTAL HYSTERECTOMY N/A 06/24/2022   Procedure: XI ROBOTIC ASSISTED MODIFIED RADICAL TRACHELECTOMY AND CYSTOSCOPY;  Surgeon: Carver Fila, MD;  Location: [facility];  Service: Gynecology;  Laterality: N/A;   TANDEM RING INSERTION N/A 05/12/2022   Procedure: TANDEM RING INSERTION;  Surgeon: Antony Blackbird, MD;  Location: [facility];  Service: Urology;  Laterality: N/A;   VAGINECTOMY, PARTIAL N/A 06/24/2022   Procedure: UPPER VAGINECTOMY;  Surgeon: Carver Fila, MD;  Location: [facility];  Service: Gynecology;  Laterality: N/A;    Family History  Problem Relation Age of Onset   COPD Mother    Cancer Father        lung   Colon cancer Father    Breast cancer Paternal Aunt        dx early 1s   Cirrhosis Maternal  Grandmother        non-alcoholic cirrhosis   Heart attack Maternal Grandfather    Diabetes Maternal Grandfather    Colon cancer Paternal Grandfather        dx 72s   Lymphoma Cousin    Ovarian cancer Neg Hx    Endometrial cancer Neg Hx    Pancreatic cancer Neg Hx    Prostate cancer Neg Hx     Social History   Socioeconomic History   Marital status: Married    Spouse name: Not on file   Number of children: Not on file   Years of education: Not on file   Highest education level: Not on file  Occupational History   Occupation: substitute teacher  Tobacco Use   Smoking  status: Never   Smokeless tobacco: Never  Vaping Use   Vaping status: Never Used  Substance and Sexual Activity   Alcohol use: No   Drug use: Never   Sexual activity: Not Currently    Partners: Male  Other Topics Concern   Not on file  Social History Narrative   Not on file   Social Determinants of Health   Financial Resource Strain: Not on file  Food Insecurity: Not on file  Transportation Needs: Not on file  Physical Activity: Not on file  Stress: Not on file  Social Connections: Not on file    Current Medications:  Current Outpatient Medications:    acetaminophen (TYLENOL) 500 MG tablet, Take 1,000 mg by mouth every 6 (six) hours as needed for moderate pain., Disp: , Rfl:    atorvastatin (LIPITOR) 10 MG tablet, Take 5 mg by mouth daily. Taking 1/2 pill/day, Disp: , Rfl:    buPROPion (WELLBUTRIN XL) 300 MG 24 hr tablet, Take 300 mg by mouth daily., Disp: , Rfl:    famotidine (PEPCID) 20 MG tablet, Take 1 tablet (20 mg total) by mouth 2 (two) times daily. (Patient taking differently: Take 20 mg by mouth 2 (two) times daily as needed for heartburn or indigestion.), Disp: 30 tablet, Rfl: 0   FLUoxetine (PROZAC) 40 MG capsule, Take 40 mg by mouth daily., Disp: , Rfl:    fluticasone (FLONASE) 50 MCG/ACT nasal spray, Place 2 sprays into both nostrils daily as needed for allergies., Disp: , Rfl:    levothyroxine (SYNTHROID, LEVOTHROID) 25 MCG tablet, Take 25 mcg by mouth daily., Disp: , Rfl:   Review of Systems: Denies appetite changes, fevers, chills, fatigue, unexplained weight changes. Denies hearing loss, neck lumps or masses, mouth sores, ringing in ears or voice changes. Denies cough or wheezing.  Denies shortness of breath. Denies chest pain or palpitations. Denies leg swelling. Denies abdominal distention, pain, blood in stools, constipation, diarrhea, nausea, vomiting, or early satiety. Denies pain with intercourse, dysuria, frequency, hematuria or incontinence. Denies  hot flashes, pelvic pain, vaginal bleeding or vaginal discharge.   Denies joint pain, back pain or muscle pain/cramps. Denies itching, rash, or wounds. Denies dizziness, headaches, numbness or seizures. Denies swollen lymph nodes or glands, denies easy bruising or bleeding. Denies anxiety, depression, confusion, or decreased concentration.  Physical Exam: BP (!) 140/72 (BP Location: Right Arm, Patient Position: Sitting) Comment: MD notified  Pulse 78   Temp 98.4 F (36.9 C) (Oral)   Resp 16   Ht 5' (1.524 m)   Wt 163 lb (73.9 kg)   LMP 01/27/2013   SpO2 98%   BMI 31.83 kg/m  General: Alert, oriented, no acute distress. HEENT: Normocephalic, atraumatic, sclera anicteric. Chest: Lungs clear  to auscultation bilaterally, no wheezes or rhonchi. Cardiovascular: Regular rate and rhythm, no murmurs or rubs appreciated. Abdomen: Obese, soft, nontender.  Normoactive bowel sounds.  No masses or hepatosplenomegaly appreciated.  Well-healed incisions. Extremities: Grossly normal range of motion.  Warm, well perfused.  No edema bilaterally. Skin: No rashes or lesions noted. GU: Normal appearing external genitalia without erythema, excoriation, or lesions.  Speculum exam reveals mild atrophy as well as radiation changes at the cuff, no visible lesions. On bimanual exam, cuff is smooth, no nodularity or masses.  This is confirmed on rectovaginal exam.  Laboratory & Radiologic Studies: 03/26/23: Pap NIML, HR HPV negative  Assessment & Plan: Elizabeth Bartlett is a 62 y.o. 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 Resection margin at the time of surgery was negative for malignancy. PDL1: CPS 80%. Given negative margins with good radiation response noted on interval surgery, plan for close surveillance.   Continues to do well and is NED on exam today.     Per NCCN surveillance  recommendation, surveillance will happen every 3 months for 2-3 years.  We will alternate these between radiation oncology and my clinic.  She sees Dr. Roselind Messier in January.  She will see me back in April.  Will plan on a yearly pap test, due in April 2025. We discussed signs and symptoms that would be concerning for cancer recurrence and should prompt a phone call before her next scheduled visit.   20 minutes of total time was spent for this patient encounter, including preparation, face-to-face counseling with the patient and coordination of care, and documentation of the encounter.  Eugene Garnet, MD  Division of Gynecologic Oncology  Department of Obstetrics and Gynecology  [facility]

## 2023-10-22 ENCOUNTER — Inpatient Hospital Stay: Payer: BC Managed Care – PPO | Attending: Gynecologic Oncology | Admitting: Gynecologic Oncology

## 2023-10-22 ENCOUNTER — Encounter: Payer: Self-pay | Admitting: Gynecologic Oncology

## 2023-10-22 VITALS — BP 140/72 | HR 78 | Temp 98.4°F | Resp 16 | Ht 60.0 in | Wt 163.0 lb

## 2023-10-22 DIAGNOSIS — A630 Anogenital (venereal) warts: Secondary | ICD-10-CM | POA: Insufficient documentation

## 2023-10-22 DIAGNOSIS — Z08 Encounter for follow-up examination after completed treatment for malignant neoplasm: Secondary | ICD-10-CM | POA: Insufficient documentation

## 2023-10-22 DIAGNOSIS — Z8541 Personal history of malignant neoplasm of cervix uteri: Secondary | ICD-10-CM | POA: Diagnosis not present

## 2023-10-22 DIAGNOSIS — Z90710 Acquired absence of both cervix and uterus: Secondary | ICD-10-CM | POA: Insufficient documentation

## 2023-10-22 DIAGNOSIS — Z90722 Acquired absence of ovaries, bilateral: Secondary | ICD-10-CM | POA: Insufficient documentation

## 2023-10-22 DIAGNOSIS — Z9079 Acquired absence of other genital organ(s): Secondary | ICD-10-CM | POA: Insufficient documentation

## 2023-10-22 DIAGNOSIS — C539 Malignant neoplasm of cervix uteri, unspecified: Secondary | ICD-10-CM

## 2023-10-22 DIAGNOSIS — Z9221 Personal history of antineoplastic chemotherapy: Secondary | ICD-10-CM | POA: Diagnosis not present

## 2023-10-22 DIAGNOSIS — Z923 Personal history of irradiation: Secondary | ICD-10-CM | POA: Insufficient documentation

## 2023-10-22 NOTE — Patient Instructions (Signed)
It was good to see you today.  I do not see or feel any evidence of cancer recurrence on your exam.  I will see you for follow-up in 6 months.  As always, if you develop any new and concerning symptoms before your next visit, please call to see me sooner.   

## 2023-10-29 ENCOUNTER — Ambulatory Visit: Payer: BC Managed Care – PPO | Admitting: Gynecologic Oncology

## 2024-01-05 NOTE — Progress Notes (Signed)
Radiation Oncology         [phone] ________________________________  Name: Elizabeth Bartlett MRN: [id number]  Date: 01/06/2024  DOB: 29-Jul-1961  Follow-Up Visit Note  CC: Carver Fila, MD  Carver Fila, MD    ICD-10-CM   1. 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 She was last seen in office on 07-22-23.   During her most recent follow up visit with Dr. Pricilla Holm on 10-22-23, she reported feeling well overall. She denied any vaginal bleeding or discharge, pelvic or abdominal pain. She also reported normal bowel and bladder function.      No other significant oncologic interval history since the patient was last seen.   Today she reports to be doing well overall. She denies any vaginal bleeding or discharge, abdominal pain, abnormal bloating, or changes in her bowel/bladder habits. She has not been using her dilator regularly.   Allergies:  has no known allergies.  Meds: Current Outpatient Medications  Medication Sig Dispense Refill   acetaminophen (TYLENOL) 500 MG tablet Take 1,000 mg by mouth every 6 (six) hours as needed for moderate pain.     atorvastatin (LIPITOR) 10 MG tablet Take 5 mg by mouth daily. Taking 1/2 pill/day     buPROPion (WELLBUTRIN XL) 300 MG 24 hr tablet Take 300 mg by mouth daily.     famotidine (PEPCID) 20 MG tablet Take 1  tablet (20 mg total) by mouth 2 (two) times daily. (Patient taking differently: Take 20 mg by mouth 2 (two) times daily as needed for heartburn or indigestion.) 30 tablet 0   FLUoxetine (PROZAC) 40 MG capsule Take 40 mg by mouth daily.     fluticasone (FLONASE) 50 MCG/ACT nasal spray Place 2 sprays into both nostrils daily as needed for allergies.     levothyroxine (SYNTHROID, LEVOTHROID) 25 MCG tablet Take 25 mcg by mouth daily.     No current facility-administered medications for this encounter.    Physical Findings: The patient is in no acute distress. Patient is alert and oriented.  height is 5' (1.524 m) and weight is 162 lb 3.2 oz (73.6 kg). Her temperature is 96.8 F (36 C) (abnormal). Her blood pressure is 153/84 (abnormal) and her pulse is 89. Her respiration is 18 and oxygen saturation is 96%. .  No significant changes. Lungs are clear to auscultation bilaterally. Heart has regular rate and rhythm. No palpable cervical, supraclavicular, or axillary adenopathy. Abdomen soft, non-tender, normal bowel sounds.  On pelvic examination the external genitalia were unremarkable. A speculum exam was performed. There are no mucosal lesions noted in the vaginal vault.  The vaginal vault is somewhat foreshortened.  On bimanual and rectovaginal examination no pelvic masses appreciated.  Vaginal cuff intact.  Rectal sphincter tone good.  Minimal radiation changes noted at the surgical bed.  Plan he has been after this  Lab Findings: Lab Results  Component Value Date   WBC  12.9 (H) 06/25/2022   HGB 11.1 (L) 06/25/2022   HCT 34.0 (L) 06/25/2022   MCV 97.1 06/25/2022   PLT 260 06/25/2022    Radiographic Findings: No results found.  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 No evidence of disease recurrence on clinical exam today.   Plan:  The patient is scheduled to follow up with Dr. Pricilla Holm on 04/21/24 and will have a Pap smear at that time. We will schedule the patient to see [place] in early August. Patient was educated on signs/symptoms that may indicate disease recurrence and understands to notify us if she experiences any of them.   Discussed importance of the dilator and encouraged regular use today. Patient expressed understanding.   30 minutes of total time was spent for this patient encounter, including preparation, face-to-face counseling with the patient and coordination of care, physical exam, and documentation of the encounter. ____________________________________   Bryan Lemma, PA-C   Billie Lade, PhD, MD   [facility]  Radiation Oncology Direct Dial: [phone]  Fax: [phone] [email or web address]    This document serves as a record of services personally performed by Antony Blackbird, MD and Bryan Lemma, PA-C. It was created on his behalf by Herbie Saxon, a trained medical scribe. The creation of this record is based on the scribe's personal observations and the provider's statements to them. This document has been checked and approved by the attending provider.

## 2024-01-06 ENCOUNTER — Encounter: Payer: Self-pay | Admitting: Radiation Oncology

## 2024-01-06 ENCOUNTER — Ambulatory Visit
Admission: RE | Admit: 2024-01-06 | Discharge: 2024-01-06 | Disposition: A | Discharge status: Home / Self Care | Payer: BC Managed Care – PPO | Source: Ambulatory Visit | Attending: Radiation Oncology | Admitting: Radiation Oncology

## 2024-01-06 VITALS — BP 153/84 | HR 89 | Temp 96.8°F | Resp 18 | Ht 60.0 in | Wt 162.2 lb

## 2024-01-06 DIAGNOSIS — C541 Malignant neoplasm of endometrium: Secondary | ICD-10-CM

## 2024-01-06 DIAGNOSIS — Z79890 Hormone replacement therapy (postmenopausal): Secondary | ICD-10-CM | POA: Diagnosis not present

## 2024-01-06 DIAGNOSIS — Z79899 Other long term (current) drug therapy: Secondary | ICD-10-CM | POA: Insufficient documentation

## 2024-01-06 DIAGNOSIS — Z8542 Personal history of malignant neoplasm of other parts of uterus: Secondary | ICD-10-CM | POA: Insufficient documentation

## 2024-01-06 DIAGNOSIS — Z923 Personal history of irradiation: Secondary | ICD-10-CM | POA: Insufficient documentation

## 2024-01-06 DIAGNOSIS — C539 Malignant neoplasm of cervix uteri, unspecified: Secondary | ICD-10-CM

## 2024-01-06 NOTE — Progress Notes (Signed)
Elizabeth Bartlett is here today for follow up post radiation to the pelvic.  They completed their radiation on: 05/12/2022  Does the patient complain of any of the following:  Pain:Denies Abdominal bloating: Denies Diarrhea/Constipation: Denies Nausea/Vomiting: Denies Vaginal Discharge: Denies  Blood in Urine or Stool: Denies Urinary Issues (dysuria/incomplete emptying/ incontinence/ increased frequency/urgency): Denies Does patient report using vaginal dilator 2-3 times a week and/or sexually active 2-3 weeks: She reports the last time she used her dilators was two weeks ago Post radiation skin changes: Denies  BP (!) 153/84 (BP Location: Right Arm, Patient Position: Sitting, Cuff Size: Normal)   Pulse 89   Temp (!) 96.8 F (36 C)   Resp 18   Ht 5' (1.524 m)   Wt 162 lb 3.2 oz (73.6 kg)   LMP 01/27/2013   SpO2 96%   BMI 31.68 kg/m

## 2024-03-14 ENCOUNTER — Telehealth: Payer: Self-pay | Admitting: *Deleted

## 2024-03-14 NOTE — Telephone Encounter (Signed)
 Spoke with the patient and moved appt from 5/2 to 5/15

## 2024-04-21 ENCOUNTER — Ambulatory Visit: Payer: BC Managed Care – PPO | Admitting: Gynecologic Oncology

## 2024-05-02 ENCOUNTER — Encounter: Payer: Self-pay | Admitting: Gynecologic Oncology

## 2024-05-04 ENCOUNTER — Inpatient Hospital Stay: Attending: Gynecologic Oncology | Admitting: Gynecologic Oncology

## 2024-05-04 ENCOUNTER — Encounter: Payer: Self-pay | Admitting: Gynecologic Oncology

## 2024-05-04 VITALS — BP 136/78 | HR 79 | Temp 97.4°F | Resp 20 | Wt 162.2 lb

## 2024-05-04 DIAGNOSIS — Z923 Personal history of irradiation: Secondary | ICD-10-CM | POA: Diagnosis not present

## 2024-05-04 DIAGNOSIS — Z1151 Encounter for screening for human papillomavirus (HPV): Secondary | ICD-10-CM | POA: Diagnosis not present

## 2024-05-04 DIAGNOSIS — Z90711 Acquired absence of uterus with remaining cervical stump: Secondary | ICD-10-CM | POA: Diagnosis not present

## 2024-05-04 DIAGNOSIS — Z8542 Personal history of malignant neoplasm of other parts of uterus: Secondary | ICD-10-CM | POA: Insufficient documentation

## 2024-05-04 DIAGNOSIS — Z8541 Personal history of malignant neoplasm of cervix uteri: Secondary | ICD-10-CM

## 2024-05-04 DIAGNOSIS — Z9221 Personal history of antineoplastic chemotherapy: Secondary | ICD-10-CM | POA: Insufficient documentation

## 2024-05-04 DIAGNOSIS — Z90722 Acquired absence of ovaries, bilateral: Secondary | ICD-10-CM | POA: Diagnosis not present

## 2024-05-04 DIAGNOSIS — Z9079 Acquired absence of other genital organ(s): Secondary | ICD-10-CM | POA: Diagnosis not present

## 2024-05-04 DIAGNOSIS — Z08 Encounter for follow-up examination after completed treatment for malignant neoplasm: Secondary | ICD-10-CM | POA: Diagnosis present

## 2024-05-04 DIAGNOSIS — C539 Malignant neoplasm of cervix uteri, unspecified: Secondary | ICD-10-CM

## 2024-05-04 NOTE — Progress Notes (Signed)
 Gynecologic Oncology Return Clinic Visit  05/04/24  Reason for Visit: follow-up   Treatment History: Oncology History Overview Note  Initial biopsy - cervical biopsy, ECC ER 95%, PR 95%, MMR abnormal Negative genetics  PDL1: CPS 80%   Uterine cancer (HCC)  01/20/2013 Pathology Results   Endometrium, biopsy - POLYPOID DEGENERATING SECRETORY-TYPE ENDOMETRIUM, SEE COMMENT   01/31/2013 Surgery   She underwent supracervical hysterectomy and bilateral salpingo-oophorectomy.  Uterus was ultimately morcellated for removal.   01/31/2013 Pathology Results   Uterus and bilateral fallopian tubes - ENDOMETRIUM: PROLIFERATIVE WITH BENIGN ENDOMETRIAL POLYP. NO HYPERPLASIA OR CARCINOMA. - MYOMETRIUM: ADENOMYOSIS. LEIOMYOMA. - UTERINE SEROSA: UNREMARKABLE. NO ENDOMETRIOSIS OR MALIGNANCY. - BILATERAL FALLOPIAN TUBES: UNREMARKABLE.   12/15/2021 Initial Diagnosis   The patient experienced postmenopausal bleeding.   01/30/2022 Imaging   Pelvic ultrasound exam was performed on 2/10 showing 18 x 18 x 16 cm masslike area within the cervix containing internal blood flow, question cervical fibroids or mass/neoplasm.     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 There is marked thinning of the normal stroma posterolaterally on the right without definite parametrial extension on today's study. No involvement of the rectum. Assessment of the upper posterior bladder wall is limited by surgical scarring and susceptibility artifact from  adjacent surgical clips in this region. Within this limitation, no focal bladder wall thickening or abnormal bladder wall enhancement is appreciated. Tumor extends posteriorly to the level of the external os, but no definite vaginal wall involvement appreciable by MRI.   No evidence for pelvic sidewall lymphadenopathy.     03/25/2022 Cancer Staging   Staging form: Corpus Uteri - Carcinoma and Carcinosarcoma, AJCC 8th Edition - Clinical stage from 03/25/2022: Stage II (cT2, cN0, cM0) - Signed by Almeda Jacobs, MD on 03/25/2022 Stage prefix: Initial diagnosis   03/31/2022 Procedure   Successful placement of a right internal jugular approach power injectable Port-A-Cath. The catheter is ready for immediate use.     04/03/2022 - 05/01/2022 Chemotherapy   Patient is on Treatment Plan : Uterine Cisplatin  days 1 and days 29 with radiation     06/02/2022 Imaging   1. Mass of the cervical remnant is unchanged in size, but demonstrates new central low attenuation which is likely due to necrosis.  2. No evidence of metastatic disease in the abdomen or pelvis. 3.  Aortic Atherosclerosis (ICD10-I70.0).   06/24/2022 Pathology Results   A. CERVIX, RESECTION:  - Invasive well-differentiated adenocarcinoma of the cervix, HPV-associated, involving both anterior and posterior cervix  - Carcinoma invades for a depth of 0.7 cm  - Radial resection margin is negative for carcinoma (0.6 cm)  - See oncology table   B. OVARIES, BILATERAL, AND LEFT FALLOPIAN TUBE, SALPINGO OOPHORECTOMY:  - Benign unremarkable bilateral ovaries and left fallopian tube   C. VAGINAL MARGIN, ANTERIOR AND POSTERIOR, EXCISION:  - Fragments of vaginal mucosa showing focal low-grade squamous intraepithelial lesion (CIN1, low grade dysplasia)  - Negative for carcinoma    07/13/2022 Procedure   Technically successful tunneled Port catheter removal.     Interval History: Doing well.  Denies  any vaginal bleeding or discharge.  Reports baseline  bowel bladder function.  Denies any abdominal or pelvic pain.  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 Retta Caster   Hyperlipidemia    Hypothyroidism    followed by pcp   Migraines    Nocturia more than twice per night    OSA (obstructive sleep apnea)    05-11-2022  per pt last not used in past 6-8 months (approx 10/ 2022) due to nocutria   Wears glasses     Past Surgical History:  Procedure Laterality Date   BILATERAL SALPINGECTOMY Bilateral 01/31/2013   Procedure: BILATERAL SALPINGECTOMY;  Surgeon: Albino Hum, MD;  Location: [facility];  Service: Gynecology;  Laterality: Bilateral;   BREAST LUMPECTOMY WITH AXILLARY LYMPH NODE BIOPSY Bilateral 04/15/2015   @ [facility] in [place];   x2 sites on left, x2 site on right,  left axilla node dissection   CESAREAN SECTION  02/19/1987   CHOLECYSTECTOMY OPEN  05/22/1987   CYSTOSCOPY WITH INJECTION N/A 06/24/2022   Procedure: CYSTOSCOPY WITH INJECTION INDOCYANINE GREEN  DYE;  Surgeon: Mallie Seal, MD;  Location: [facility];  Service: Urology;  Laterality: N/A;   IR IMAGING GUIDED PORT INSERTION  03/30/2022   IR REMOVAL TUN ACCESS W/ PORT W/O FL MOD SED  07/10/2022   LAPAROSCOPIC SUPRACERVICAL HYSTERECTOMY N/A 01/31/2013   Procedure: LAPAROSCOPIC SUPRACERVICAL HYSTERECTOMY;  Surgeon: Albino Hum, MD;  Location: [facility];  Service: Gynecology;  Laterality: N/A;   OPERATIVE ULTRASOUND N/A 05/12/2022   Procedure: OPERATIVE ULTRASOUND;  Surgeon: Retta Caster, MD;  Location: [facility];  Service: Urology;  Laterality: N/A;   ROBOTIC ASSISTED TOTAL HYSTERECTOMY N/A 06/24/2022   Procedure: XI ROBOTIC ASSISTED MODIFIED RADICAL TRACHELECTOMY AND CYSTOSCOPY;  Surgeon: Suzi Essex, MD;  Location: [facility];  Service: Gynecology;  Laterality: N/A;   TANDEM RING INSERTION N/A 05/12/2022   Procedure: TANDEM RING INSERTION;  Surgeon: Retta Caster, MD;  Location: [facility];  Service: Urology;  Laterality: N/A;   VAGINECTOMY, PARTIAL N/A 06/24/2022   Procedure: UPPER VAGINECTOMY;  Surgeon: Suzi Essex, MD;  Location: [facility];  Service: Gynecology;  Laterality: N/A;    Family History  Problem Relation Age of Onset   COPD Mother    Cancer Father        lung   Colon cancer Father    Breast cancer Paternal Aunt        dx early 41s   Cirrhosis  Maternal Grandmother        non-alcoholic cirrhosis   Heart attack Maternal Grandfather    Diabetes Maternal Grandfather    Colon cancer Paternal Grandfather        dx 41s   Lymphoma Cousin    Ovarian cancer Neg Hx    Endometrial cancer Neg Hx    Pancreatic cancer Neg Hx    Prostate cancer Neg Hx     Social History   Socioeconomic History   Marital status: Married    Spouse name: Not on file   Number of children: Not on file   Years of education: Not on file   Highest education level: Not on file  Occupational History   Occupation: substitute teacher  Tobacco Use   Smoking  status: Never   Smokeless tobacco: Never  Vaping Use   Vaping status: Never Used  Substance and Sexual Activity   Alcohol use: No   Drug use: Never   Sexual activity: Not Currently    Partners: Male  Other Topics Concern   Not on file  Social History Narrative   Not on file   Social Drivers of Health   Financial Resource Strain: Not on file  Food Insecurity: Not on file  Transportation Needs: Not on file  Physical Activity: Not on file  Stress: Not on file  Social Connections: Not on file    Current Medications:  Current Outpatient Medications:    acetaminophen  (TYLENOL ) 500 MG tablet, Take 1,000 mg by mouth every 6 (six) hours as needed for moderate pain., Disp: , Rfl:    atorvastatin (LIPITOR) 10 MG tablet, Take 5 mg by mouth daily. Taking 1/2 pill/day, Disp: , Rfl:    buPROPion  (WELLBUTRIN  XL) 300 MG 24 hr tablet, Take 300 mg by mouth daily., Disp: , Rfl:    famotidine  (PEPCID ) 20 MG tablet, Take 1 tablet (20 mg total) by mouth 2 (two) times daily. (Patient taking differently: Take 20 mg by mouth 2 (two) times daily as needed for heartburn or indigestion.), Disp: 30 tablet, Rfl: 0   FLUoxetine  (PROZAC ) 40 MG capsule, Take 40 mg by mouth daily., Disp: , Rfl:    fluticasone (FLONASE) 50 MCG/ACT nasal spray, Place 2 sprays into both nostrils daily as needed for allergies., Disp: , Rfl:    levothyroxine  (SYNTHROID , LEVOTHROID) 25 MCG tablet, Take 25 mcg by mouth daily., Disp: , Rfl:   Review of Systems: Denies appetite changes, fevers, chills, fatigue, unexplained weight changes. Denies hearing loss, neck lumps or masses, mouth sores, ringing in ears or voice changes. Denies cough or wheezing.  Denies shortness of breath. Denies chest pain or palpitations. Denies leg swelling. Denies abdominal distention, pain, blood in stools, constipation, diarrhea, nausea, vomiting, or early satiety. Denies pain with intercourse, dysuria, frequency, hematuria or incontinence. Denies hot  flashes, pelvic pain, vaginal bleeding or vaginal discharge.   Denies joint pain, back pain or muscle pain/cramps. Denies itching, rash, or wounds. Denies dizziness, headaches, numbness or seizures. Denies swollen lymph nodes or glands, denies easy bruising or bleeding. Denies anxiety, depression, confusion, or decreased concentration.  Physical Exam: BP (!) 144/72   Pulse 79   Temp (!) 97.4 F (36.3 C)   Resp 20   Wt 162 lb 3.2 oz (73.6 kg)   LMP 01/27/2013   SpO2 99%   BMI 31.68 kg/m  General: Alert, oriented, no acute distress. HEENT: Normocephalic, atraumatic, sclera anicteric. Chest: Lungs clear to auscultation bilaterally, no wheezes or rhonchi. Cardiovascular: Regular rate and rhythm,  no murmurs or rubs appreciated. Abdomen: Obese, soft, nontender.  Normoactive bowel sounds.  No masses or hepatosplenomegaly appreciated.  Well-healed incisions. Extremities: Grossly normal range of motion.  Warm, well perfused.  No edema bilaterally. Skin: No rashes or lesions noted. GU: Normal appearing external genitalia without erythema, excoriation, or lesions.  Speculum exam reveals mild atrophy as well as radiation changes at the cuff, no visible lesions. Pap/HPV collected. On bimanual exam, cuff is smooth, no nodularity or masses.  This is confirmed on rectovaginal exam.  Laboratory & Radiologic Studies: 03/26/23: Pap NIML, HR HPV negative   Assessment & Plan: Elizabeth Bartlett is a 63 y.o. 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 Resection margin at the time of surgery was negative for malignancy. PDL1: CPS 80%. Given negative margins with good radiation response noted on interval surgery, plan for close surveillance.   Continues to do well and is NED on exam today.  Pap and HPV repeated today.   Per NCCN surveillance recommendation, surveillance will happen  every 3 months for 2-3 years.  We will alternate these between radiation oncology and my clinic.  She sees Dr. Eloise Hake in August.  She will see me in 6 months.  Will plan on a yearly pap test, due today. We discussed signs and symptoms that would be concerning for cancer recurrence and should prompt a phone call before her next scheduled visit.   20 minutes of total time was spent for this patient encounter, including preparation, face-to-face counseling with the patient and coordination of care, and documentation of the encounter.  Wiley Hanger, MD  Division of Gynecologic Oncology  Department of Obstetrics and Gynecology  [facility]

## 2024-05-04 NOTE — Patient Instructions (Signed)
 It was good to see you today.  I do not see or feel any evidence of cancer recurrence on your exam.  We will contact you with your Pap results next week.  I will see you for follow-up in 6 months.  As always, if you develop any new and concerning symptoms before your next visit, please call to see me sooner.

## 2024-05-09 ENCOUNTER — Ambulatory Visit: Payer: Self-pay | Admitting: Gynecologic Oncology

## 2024-05-09 LAB — CYTOLOGY - PAP
Comment: NEGATIVE
Diagnosis: REACTIVE
High risk HPV: NEGATIVE

## 2024-05-10 NOTE — Progress Notes (Signed)
 Please let her know pap and HPV are normal/negative. Thank you!

## 2024-05-11 NOTE — Telephone Encounter (Signed)
 Spoke with Elizabeth Bartlett and relayed message from Dr. Orvil Bland that her pap and HPV are normal/negative. Pt was pleased and thanked the office for calling.

## 2024-05-11 NOTE — Telephone Encounter (Signed)
-----   Message from Suzi Essex sent at 05/10/2024  5:13 PM EDT ----- Please let her know pap and HPV are normal/negative. Thank you!

## 2024-06-01 ENCOUNTER — Other Ambulatory Visit: Payer: Self-pay | Admitting: Surgery

## 2024-06-01 DIAGNOSIS — Z800 Family history of malignant neoplasm of digestive organs: Secondary | ICD-10-CM

## 2024-06-02 ENCOUNTER — Telehealth: Payer: Self-pay

## 2024-06-02 NOTE — Telephone Encounter (Signed)
 Pt is aware, Dr.Tucker recommends getting the CT scan.

## 2024-06-02 NOTE — Telephone Encounter (Signed)
 Elizabeth Bartlett called to give Dr.Tucker an update and wants any advice if at all possible.   She states she went in yesterday for a colonoscopy, the procedure could not be finished d/t the doctor stating there was something twisted. The PCP wants her to have a CT (not scheduled at time of call)   Elizabeth Bartlett reports she and her husband are really nervous,they trust Dr.Tucker and wanted to get her thoughts. She isn't sure about whether to get the CT or not.   Pt is aware to go ahead and schedule the CT scan as advised by her PCP. She voiced an understanding. She is aware Dr.Tucker is out of the office today.   Message will be sent to Dr.Tucker

## 2024-06-20 ENCOUNTER — Ambulatory Visit
Admission: RE | Admit: 2024-06-20 | Discharge: 2024-06-20 | Disposition: A | Discharge status: Home / Self Care | Source: Ambulatory Visit | Attending: Surgery | Admitting: Surgery

## 2024-06-20 DIAGNOSIS — Z800 Family history of malignant neoplasm of digestive organs: Secondary | ICD-10-CM

## 2024-07-26 NOTE — Progress Notes (Signed)
 Radiation Oncology         [phone] ________________________________  Name: Elizabeth Bartlett MRN: [id number]  Date: 07/27/2024  DOB: Oct 30, 1961  Follow-Up Visit Note  CC: Viktoria Comer SAUNDERS, MD  Viktoria Comer SAUNDERS, MD  No diagnosis found. 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 She was last seen in office on 01/06/24 for a follow up visit. Patient continued to follow up with their specialists to manage their chronic conditions.   In the interval since she was last seen, she presented for a follow up visit with Dr. Viktoria on 05/04/24 during which she reported doing well overall and was noted NED on examination. During her visit, she did undergo a Pap and HPV testing. Cytology returned normal.   No other significant oncologic interval history since the patient was last seen.    ***                             Allergies:  has no known allergies.  Meds: Current Outpatient Medications  Medication Sig Dispense Refill   acetaminophen  (TYLENOL ) 500 MG tablet Take 1,000 mg by mouth every 6 (six) hours as needed for moderate pain.     atorvastatin (LIPITOR) 10 MG tablet Take 5 mg by mouth daily. Taking 1/2 pill/day     buPROPion  (WELLBUTRIN  XL) 300 MG 24 hr tablet Take 300 mg by mouth daily.     famotidine  (PEPCID ) 20 MG tablet Take 1 tablet (20 mg total) by mouth 2 (two) times daily. (Patient taking  differently: Take 20 mg by mouth 2 (two) times daily as needed for heartburn or indigestion.) 30 tablet 0   FLUoxetine  (PROZAC ) 40 MG capsule Take 40 mg by mouth daily.     fluticasone (FLONASE) 50 MCG/ACT nasal spray Place 2 sprays into both nostrils daily as needed for allergies.     levothyroxine  (SYNTHROID , LEVOTHROID) 25 MCG tablet Take 25 mcg by mouth daily.     No current facility-administered medications for this encounter.    Physical Findings: The patient is in no acute distress. Patient is alert and oriented.  vitals were not taken for this visit. .  No significant changes. Lungs are clear to auscultation bilaterally. Heart has regular rate and rhythm. No palpable cervical, supraclavicular, or axillary adenopathy. Abdomen soft, non-tender, normal bowel sounds.  On pelvic examination the external genitalia were unremarkable. A speculum exam was performed. There are no mucosal lesions noted in the vaginal vault.  The vaginal vault is somewhat foreshortened.  On bimanual and rectovaginal examination no pelvic masses appreciated.  Vaginal cuff intact.  Rectal sphincter tone good.  Minimal radiation changes noted at the surgical bed. ***   Lab Findings: Lab Results  Component Value Date   WBC 12.9 (H) 06/25/2022   HGB 11.1 (L) 06/25/2022   HCT 34.0 (L) 06/25/2022   MCV 97.1 06/25/2022   PLT 260 06/25/2022  Radiographic Findings: No results found.  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 ***  Plan:  Per NCCN guidelines, patient will follow up with Dr. Viktoria in 3 months and radiation oncology in 6 months. Patient was educated on signs/symptoms that may indicate  disease recurrence and understands to notify us  if she experiences any of them.     *** minutes of total time was spent for this patient encounter, including preparation, face-to-face counseling with the patient and coordination of care, physical exam, and documentation of the encounter. ____________________________________   Leeroy Due, PA-C   Lynwood CHARM Nasuti, PhD, MD   [facility]  Radiation Oncology Direct Dial: [phone]  Fax: [phone] [email or web address]    This document serves as a record of services personally performed by Lynwood Nasuti, MD. It was created on his behalf by Reymundo Cartwright, a trained medical scribe. The creation of this record is based on the scribe's personal observations and the provider's statements to them. This document has been checked and approved by the attending provider.

## 2024-07-27 ENCOUNTER — Encounter: Payer: Self-pay | Admitting: Radiation Oncology

## 2024-07-27 ENCOUNTER — Ambulatory Visit
Admission: RE | Admit: 2024-07-27 | Discharge: 2024-07-27 | Disposition: A | Discharge status: Home / Self Care | Payer: Self-pay | Source: Ambulatory Visit | Attending: Radiation Oncology | Admitting: Radiation Oncology

## 2024-07-27 VITALS — BP 146/88 | HR 76 | Temp 97.1°F | Resp 18 | Ht 60.0 in | Wt 160.4 lb

## 2024-07-27 DIAGNOSIS — Z79890 Hormone replacement therapy (postmenopausal): Secondary | ICD-10-CM | POA: Insufficient documentation

## 2024-07-27 DIAGNOSIS — Z79899 Other long term (current) drug therapy: Secondary | ICD-10-CM | POA: Diagnosis not present

## 2024-07-27 DIAGNOSIS — Z8541 Personal history of malignant neoplasm of cervix uteri: Secondary | ICD-10-CM | POA: Insufficient documentation

## 2024-07-27 DIAGNOSIS — Z923 Personal history of irradiation: Secondary | ICD-10-CM | POA: Diagnosis not present

## 2024-07-27 DIAGNOSIS — C541 Malignant neoplasm of endometrium: Secondary | ICD-10-CM

## 2024-07-27 DIAGNOSIS — C539 Malignant neoplasm of cervix uteri, unspecified: Secondary | ICD-10-CM

## 2024-07-27 NOTE — Progress Notes (Signed)
 Elizabeth Bartlett is here today for follow up post radiation to the pelvic.  They completed their radiation on: 05/12/22   Does the patient complain of any of the following:  Pain: No Abdominal bloating: no Diarrhea/Constipation: No Nausea/Vomiting: No  Vaginal Discharge: No Blood in Urine or Stool: No Urinary Issues (dysuria/incomplete emptying/ incontinence/ increased frequency/urgency): No Does patient report using vaginal dilator 2-3 times a week and/or sexually active 2-3 weeks: Yes  Post radiation skin changes: No   Additional comments if applicable:  BP (!) 146/88 (BP Location: Left Arm, Patient Position: Sitting)   Pulse 76   Temp (!) 97.1 F (36.2 C) (Temporal)   Resp 18   Ht 5' (1.524 m)   Wt 160 lb 6 oz (72.7 kg)   LMP 01/27/2013   SpO2 98%   BMI 31.32 kg/m    Last pap smear 05/09/24

## 2024-10-31 ENCOUNTER — Encounter: Payer: Self-pay | Admitting: Gynecologic Oncology

## 2024-11-02 ENCOUNTER — Encounter: Payer: Self-pay | Admitting: Gynecologic Oncology

## 2024-11-02 ENCOUNTER — Inpatient Hospital Stay: Attending: Gynecologic Oncology | Admitting: Gynecologic Oncology

## 2024-11-02 VITALS — BP 133/60 | HR 80 | Temp 98.8°F | Resp 19 | Wt 163.6 lb

## 2024-11-02 DIAGNOSIS — Z9221 Personal history of antineoplastic chemotherapy: Secondary | ICD-10-CM | POA: Insufficient documentation

## 2024-11-02 DIAGNOSIS — Z08 Encounter for follow-up examination after completed treatment for malignant neoplasm: Secondary | ICD-10-CM | POA: Diagnosis not present

## 2024-11-02 DIAGNOSIS — Z9079 Acquired absence of other genital organ(s): Secondary | ICD-10-CM | POA: Diagnosis not present

## 2024-11-02 DIAGNOSIS — Z923 Personal history of irradiation: Secondary | ICD-10-CM | POA: Diagnosis not present

## 2024-11-02 DIAGNOSIS — Z8541 Personal history of malignant neoplasm of cervix uteri: Secondary | ICD-10-CM | POA: Insufficient documentation

## 2024-11-02 DIAGNOSIS — Z90710 Acquired absence of both cervix and uterus: Secondary | ICD-10-CM | POA: Insufficient documentation

## 2024-11-02 DIAGNOSIS — C539 Malignant neoplasm of cervix uteri, unspecified: Secondary | ICD-10-CM

## 2024-11-02 DIAGNOSIS — Z90722 Acquired absence of ovaries, bilateral: Secondary | ICD-10-CM | POA: Insufficient documentation

## 2024-11-02 NOTE — Patient Instructions (Signed)
 It was good to see you today.  I do not see or feel any evidence of cancer recurrence on your exam.  I will see you for follow-up in 6 months.  As always, if you develop any new and concerning symptoms before your next visit, please call to see me sooner.

## 2024-11-02 NOTE — Progress Notes (Signed)
 Gynecologic Oncology Return Clinic Visit  11/02/24  Reason for Visit: surveillance  Treatment History: Oncology History Overview Note  Initial biopsy - cervical biopsy, ECC ER 95%, PR 95%, MMR abnormal Negative genetics  PDL1: CPS 80%   Uterine cancer (HCC)  01/20/2013 Pathology Results   Endometrium, biopsy - POLYPOID DEGENERATING SECRETORY-TYPE ENDOMETRIUM, SEE COMMENT   01/31/2013 Surgery   She underwent supracervical hysterectomy and bilateral salpingo-oophorectomy.  Uterus was ultimately morcellated for removal.   01/31/2013 Pathology Results   Uterus and bilateral fallopian tubes - ENDOMETRIUM: PROLIFERATIVE WITH BENIGN ENDOMETRIAL POLYP. NO HYPERPLASIA OR CARCINOMA. - MYOMETRIUM: ADENOMYOSIS. LEIOMYOMA. - UTERINE SEROSA: UNREMARKABLE. NO ENDOMETRIOSIS OR MALIGNANCY. - BILATERAL FALLOPIAN TUBES: UNREMARKABLE.   12/15/2021 Initial Diagnosis   The patient experienced postmenopausal bleeding.   01/30/2022 Imaging   Pelvic ultrasound exam was performed on 2/10 showing 18 x 18 x 16 cm masslike area within the cervix containing internal blood flow, question cervical fibroids or mass/neoplasm.     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 There is marked thinning of the normal stroma posterolaterally on the right without definite parametrial extension on today's study. No involvement of the rectum. Assessment of the upper posterior bladder wall is limited by surgical scarring and susceptibility artifact from  adjacent surgical clips in this region. Within this limitation, no focal bladder wall thickening or abnormal bladder wall enhancement is appreciated. Tumor extends posteriorly to the level of the external os, but no definite vaginal wall involvement appreciable by MRI.   No evidence for pelvic sidewall lymphadenopathy.     03/25/2022 Cancer Staging   Staging form: Corpus Uteri - Carcinoma and Carcinosarcoma, AJCC 8th Edition - Clinical stage from 03/25/2022: Stage II (cT2, cN0, cM0) - Signed by Lonn Hicks, MD on 03/25/2022 Stage prefix: Initial diagnosis   03/31/2022 Procedure   Successful placement of a right internal jugular approach power injectable Port-A-Cath. The catheter is ready for immediate use.     04/03/2022 - 05/01/2022 Chemotherapy   Patient is on Treatment Plan : Uterine Cisplatin  days 1 and days 29 with radiation     06/02/2022 Imaging   1. Mass of the cervical remnant is unchanged in size, but demonstrates new central low attenuation which is likely due to necrosis.  2. No evidence of metastatic disease in the abdomen or pelvis. 3.  Aortic Atherosclerosis (ICD10-I70.0).   06/24/2022 Pathology Results   A. CERVIX, RESECTION:  - Invasive well-differentiated adenocarcinoma of the cervix, HPV-associated, involving both anterior and posterior cervix  - Carcinoma invades for a depth of 0.7 cm  - Radial resection margin is negative for carcinoma (0.6 cm)  - See oncology table   B. OVARIES, BILATERAL, AND LEFT FALLOPIAN TUBE, SALPINGO OOPHORECTOMY:  - Benign unremarkable bilateral ovaries and left fallopian tube   C. VAGINAL MARGIN, ANTERIOR AND POSTERIOR, EXCISION:  - Fragments of vaginal mucosa showing focal low-grade squamous intraepithelial lesion (CIN1, low grade dysplasia)  - Negative for carcinoma    07/13/2022 Procedure   Technically successful tunneled Port catheter removal.     Interval History: Doing well.  Denies any  vaginal bleeding.  Reports normal bowel and  bladder function.  Denies any abdominal or pelvic pain.  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 Lynwood Shannon   Hyperlipidemia    Hypothyroidism    followed by pcp   Migraines    Nocturia more than twice per night    OSA (obstructive sleep apnea)    05-11-2022  per pt last not used in past 6-8 months (approx 10/ 2022) due to nocutria   Wears glasses     Past Surgical History:  Procedure Laterality Date   BILATERAL SALPINGECTOMY Bilateral 01/31/2013   Procedure: BILATERAL SALPINGECTOMY;  Surgeon: Norleen LULLA Server, MD;  Location: [facility];  Service: Gynecology;  Laterality: Bilateral;   BREAST LUMPECTOMY WITH AXILLARY LYMPH NODE BIOPSY Bilateral 04/15/2015   @ [facility] in [place];   x2 sites on left, x2 site on right,  left axilla node dissection   CESAREAN SECTION  02/19/1987   CHOLECYSTECTOMY OPEN  05/22/1987   CYSTOSCOPY WITH INJECTION N/A 06/24/2022   Procedure: CYSTOSCOPY WITH INJECTION INDOCYANINE GREEN DYE;   Surgeon: Lovie Arlyss CROME, MD;  Location: [facility];  Service: Urology;  Laterality: N/A;   IR IMAGING GUIDED PORT INSERTION  03/30/2022   IR REMOVAL TUN ACCESS W/ PORT W/O FL MOD SED  07/10/2022   LAPAROSCOPIC SUPRACERVICAL HYSTERECTOMY N/A 01/31/2013   Procedure: LAPAROSCOPIC SUPRACERVICAL HYSTERECTOMY;  Surgeon: Norleen LULLA Server, MD;  Location: [facility];  Service: Gynecology;  Laterality: N/A;   OPERATIVE ULTRASOUND N/A 05/12/2022   Procedure: OPERATIVE ULTRASOUND;  Surgeon: Shannon Lynwood, MD;  Location: [facility];  Service: Urology;  Laterality: N/A;   ROBOTIC ASSISTED TOTAL HYSTERECTOMY N/A 06/24/2022   Procedure: XI ROBOTIC ASSISTED MODIFIED RADICAL TRACHELECTOMY AND CYSTOSCOPY;  Surgeon: Viktoria Comer SAUNDERS, MD;  Location: [facility];  Service: Gynecology;  Laterality: N/A;   TANDEM RING INSERTION N/A 05/12/2022   Procedure: TANDEM RING INSERTION;  Surgeon: Shannon Lynwood, MD;  Location: [facility];  Service: Urology;  Laterality: N/A;   VAGINECTOMY, PARTIAL N/A 06/24/2022   Procedure: UPPER VAGINECTOMY;  Surgeon: Viktoria Comer SAUNDERS, MD;  Location: [facility];  Service: Gynecology;  Laterality: N/A;    Family History  Problem Relation Age of Onset   COPD Mother    Cancer Father        lung   Colon cancer Father    Breast cancer Paternal Aunt        dx early 75s   Cirrhosis Maternal Grandmother  non-alcoholic cirrhosis   Heart attack Maternal Grandfather    Diabetes Maternal Grandfather    Colon cancer Paternal Grandfather        dx 54s   Lymphoma Cousin    Ovarian cancer Neg Hx    Endometrial cancer Neg Hx    Pancreatic cancer Neg Hx    Prostate cancer Neg Hx     Social History   Socioeconomic History   Marital status: Married    Spouse name: Not on file   Number of children: Not on file   Years of education: Not on file   Highest education level: Not on file  Occupational History   Occupation: substitute teacher  Tobacco Use   Smoking status:  Never   Smokeless tobacco: Never  Vaping Use   Vaping status: Never Used  Substance and Sexual Activity   Alcohol use: No   Drug use: Never   Sexual activity: Not Currently    Partners: Male  Other Topics Concern   Not on file  Social History Narrative   Not on file   Social Drivers of Health   Financial Resource Strain: Not on file  Food Insecurity: Not on file  Transportation Needs: Not on file  Physical Activity: Not on file  Stress: Not on file  Social Connections: Not on file    Current Medications:  Current Outpatient Medications:    acetaminophen  (TYLENOL ) 500 MG tablet, Take 1,000 mg by mouth every 6 (six) hours as needed for moderate pain., Disp: , Rfl:    atorvastatin (LIPITOR) 10 MG tablet, Take 5 mg by mouth daily. Taking 1/2 pill/day, Disp: , Rfl:    buPROPion  (WELLBUTRIN  XL) 300 MG 24 hr tablet, Take 300 mg by mouth daily., Disp: , Rfl:    famotidine  (PEPCID ) 20 MG tablet, Take 1 tablet (20 mg total) by mouth 2 (two) times daily., Disp: 30 tablet, Rfl: 0   FLUoxetine  (PROZAC ) 40 MG capsule, Take 40 mg by mouth daily., Disp: , Rfl:    fluticasone (FLONASE) 50 MCG/ACT nasal spray, Place 2 sprays into both nostrils daily as needed for allergies., Disp: , Rfl:    levothyroxine  (SYNTHROID , LEVOTHROID) 25 MCG tablet, Take 25 mcg by mouth daily., Disp: , Rfl:   Review of Systems: Denies appetite changes, fevers, chills, fatigue, unexplained weight changes. Denies hearing loss, neck lumps or masses, mouth sores, ringing in ears or voice changes. Denies cough or wheezing.  Denies shortness of breath. Denies chest pain or palpitations. Denies leg swelling. Denies abdominal distention, pain, blood in stools, constipation, diarrhea, nausea, vomiting, or early satiety. Denies pain with intercourse, dysuria, frequency, hematuria or incontinence. Denies hot flashes, pelvic pain, vaginal bleeding or vaginal discharge.   Denies joint pain, back pain or muscle  pain/cramps. Denies itching, rash, or wounds. Denies dizziness, headaches, numbness or seizures. Denies swollen lymph nodes or glands, denies easy bruising or bleeding. Denies anxiety, depression, confusion, or decreased concentration.  Physical Exam: BP 133/60 (BP Location: Left Arm, Patient Position: Sitting)   Pulse 80   Temp 98.8 F (37.1 C) (Oral)   Resp 19   Wt 163 lb 9.6 oz (74.2 kg)   LMP 01/27/2013   SpO2 97%   BMI 31.95 kg/m  General: Alert, oriented, no acute distress. HEENT: Normocephalic, atraumatic, sclera anicteric. Chest: Lungs clear to auscultation bilaterally, no wheezes or rhonchi. Cardiovascular: Regular rate and rhythm, no murmurs or rubs appreciated. Abdomen: Obese, soft, nontender.  Normoactive bowel sounds.  No masses or hepatosplenomegaly appreciated.  Well-healed  incisions. Extremities: Grossly normal range of motion.  Warm, well perfused.  No edema bilaterally. Skin: No rashes or lesions noted. GU: Normal appearing external genitalia without erythema, excoriation, or lesions.  Speculum exam reveals mild atrophy as well as radiation changes at the cuff, no visible lesions. On bimanual exam, cuff is smooth, no nodularity or masses.  This is confirmed on rectovaginal exam.  Laboratory & Radiologic Studies: Pap 04/2024: NILM, HR HPV neg  Assessment & Plan: Elizabeth Bartlett is a 63 y.o. 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 Resection margin at the time of surgery was negative for malignancy. PDL1: CPS 80%. Given negative margins with good radiation response noted on interval surgery, plan for close surveillance.   Doing very well.  NED on exam today.   Per NCCN surveillance recommendation, surveillance will happen every 3 months for 2-3 years.  We will alternate these between radiation oncology and my clinic.  She sees Dr. Shannon in  August.  She will see me in 6 months.  Will plan on a yearly pap test, due 04/2025. We discussed signs and symptoms that would be concerning for cancer recurrence and should prompt a phone call before her next scheduled visit.   20 minutes of total time was spent for this patient encounter, including preparation, face-to-face counseling with the patient and coordination of care, and documentation of the encounter.  Comer Dollar, MD  Division of Gynecologic Oncology  Department of Obstetrics and Gynecology  [facility]

## 2025-01-29 ENCOUNTER — Ambulatory Visit: Payer: Self-pay | Admitting: Radiation Oncology

## 2025-05-03 ENCOUNTER — Inpatient Hospital Stay: Admitting: Gynecologic Oncology
